# Patient Record
Sex: Female | Born: 1960 | Race: White | Hispanic: No | Smoking: Current some day smoker
Health system: Southern US, Community
[De-identification: ages and names within clinical notes are randomized; demographics above are authoritative.]

## PROBLEM LIST (undated history)

## (undated) DIAGNOSIS — I10 Essential (primary) hypertension: Secondary | ICD-10-CM

## (undated) DIAGNOSIS — K5792 Diverticulitis of intestine, part unspecified, without perforation or abscess without bleeding: Secondary | ICD-10-CM

## (undated) DIAGNOSIS — I456 Pre-excitation syndrome: Secondary | ICD-10-CM

## (undated) DIAGNOSIS — Z8679 Personal history of other diseases of the circulatory system: Secondary | ICD-10-CM

## (undated) DIAGNOSIS — Z9889 Other specified postprocedural states: Secondary | ICD-10-CM

## (undated) HISTORY — PX: COLONOSCOPY: SHX174

## (undated) HISTORY — PX: COLOSTOMY: SHX63

## (undated) HISTORY — PX: CHOLECYSTECTOMY: SHX55

## (undated) HISTORY — PX: COLOSTOMY REVISION: SHX5232

## (undated) HISTORY — PX: ABDOMINAL SURGERY: SHX537

---
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
## 1990-05-05 DIAGNOSIS — Z9889 Other specified postprocedural states: Secondary | ICD-10-CM | POA: Insufficient documentation

## 2002-06-20 DIAGNOSIS — Z729 Problem related to lifestyle, unspecified: Secondary | ICD-10-CM | POA: Insufficient documentation

## 2004-06-11 ENCOUNTER — Other Ambulatory Visit: Payer: Self-pay

## 2004-06-11 ENCOUNTER — Emergency Department: Payer: Self-pay | Admitting: Emergency Medicine

## 2004-11-12 ENCOUNTER — Emergency Department: Payer: Self-pay | Admitting: Unknown Physician Specialty

## 2004-12-03 ENCOUNTER — Emergency Department: Payer: Self-pay | Admitting: Emergency Medicine

## 2005-06-05 ENCOUNTER — Emergency Department: Payer: Self-pay | Admitting: General Practice

## 2005-06-05 ENCOUNTER — Other Ambulatory Visit: Payer: Self-pay

## 2005-08-07 ENCOUNTER — Emergency Department: Payer: Self-pay | Admitting: Emergency Medicine

## 2005-09-14 ENCOUNTER — Emergency Department: Payer: Self-pay | Admitting: Emergency Medicine

## 2005-09-15 ENCOUNTER — Emergency Department: Payer: Self-pay | Admitting: Emergency Medicine

## 2005-09-21 ENCOUNTER — Inpatient Hospital Stay: Payer: Self-pay | Admitting: Internal Medicine

## 2005-10-13 ENCOUNTER — Inpatient Hospital Stay: Payer: Self-pay | Admitting: Surgery

## 2006-07-26 ENCOUNTER — Emergency Department: Payer: Self-pay | Admitting: Emergency Medicine

## 2006-08-12 ENCOUNTER — Ambulatory Visit: Payer: Self-pay | Admitting: Gastroenterology

## 2007-10-03 ENCOUNTER — Emergency Department: Payer: Self-pay | Admitting: Emergency Medicine

## 2007-11-08 ENCOUNTER — Emergency Department: Payer: Self-pay | Admitting: Emergency Medicine

## 2007-12-02 ENCOUNTER — Other Ambulatory Visit: Payer: Self-pay

## 2007-12-02 ENCOUNTER — Emergency Department: Payer: Self-pay | Admitting: Emergency Medicine

## 2008-04-25 ENCOUNTER — Emergency Department: Payer: Self-pay | Admitting: Unknown Physician Specialty

## 2009-01-01 ENCOUNTER — Inpatient Hospital Stay (HOSPITAL_COMMUNITY): Admission: EM | Admit: 2009-01-01 | Discharge: 2009-01-06 | Payer: Self-pay | Admitting: Emergency Medicine

## 2009-03-02 ENCOUNTER — Inpatient Hospital Stay: Payer: Self-pay | Admitting: Surgery

## 2009-07-10 ENCOUNTER — Emergency Department: Payer: Self-pay | Admitting: Unknown Physician Specialty

## 2009-07-21 ENCOUNTER — Emergency Department: Payer: Self-pay | Admitting: Emergency Medicine

## 2009-08-29 ENCOUNTER — Emergency Department: Payer: Self-pay | Admitting: Emergency Medicine

## 2009-10-04 ENCOUNTER — Emergency Department: Payer: Self-pay | Admitting: Emergency Medicine

## 2009-10-13 ENCOUNTER — Emergency Department: Payer: Self-pay | Admitting: Internal Medicine

## 2009-10-25 ENCOUNTER — Ambulatory Visit: Payer: Self-pay | Admitting: Gastroenterology

## 2009-12-20 ENCOUNTER — Emergency Department: Payer: Self-pay | Admitting: Emergency Medicine

## 2010-06-20 ENCOUNTER — Emergency Department: Payer: Self-pay | Admitting: Emergency Medicine

## 2010-09-06 ENCOUNTER — Emergency Department: Payer: Self-pay | Admitting: Internal Medicine

## 2010-09-08 ENCOUNTER — Emergency Department: Payer: Self-pay | Admitting: Emergency Medicine

## 2010-09-22 ENCOUNTER — Emergency Department: Payer: Self-pay | Admitting: Emergency Medicine

## 2010-10-20 LAB — CBC
HCT: 33.8 % — ABNORMAL LOW (ref 36.0–46.0)
HCT: 39 % (ref 36.0–46.0)
Hemoglobin: 11.8 g/dL — ABNORMAL LOW (ref 12.0–15.0)
Hemoglobin: 13.4 g/dL (ref 12.0–15.0)
MCHC: 34.3 g/dL (ref 30.0–36.0)
MCHC: 35 g/dL (ref 30.0–36.0)
MCV: 93.4 fL (ref 78.0–100.0)
MCV: 93.8 fL (ref 78.0–100.0)
Platelets: 334 10*3/uL (ref 150–400)
Platelets: 390 10*3/uL (ref 150–400)
RBC: 3.62 MIL/uL — ABNORMAL LOW (ref 3.87–5.11)
RBC: 4.16 MIL/uL (ref 3.87–5.11)
RDW: 13.9 % (ref 11.5–15.5)
RDW: 14.6 % (ref 11.5–15.5)
WBC: 5.1 10*3/uL (ref 4.0–10.5)
WBC: 7.7 10*3/uL (ref 4.0–10.5)

## 2010-10-20 LAB — BASIC METABOLIC PANEL
BUN: 5 mg/dL — ABNORMAL LOW (ref 6–23)
CO2: 30 mEq/L (ref 19–32)
Calcium: 9 mg/dL (ref 8.4–10.5)
Chloride: 98 mEq/L (ref 96–112)
Creatinine, Ser: 0.53 mg/dL (ref 0.4–1.2)
GFR calc Af Amer: >60 mL/min (ref >60)
GFR calc non Af Amer: >60 mL/min (ref >60)
Glucose, Bld: 102 mg/dL — ABNORMAL HIGH (ref 70–99)
Potassium: 3.7 mEq/L (ref 3.5–5.1)
Sodium: 134 mEq/L — ABNORMAL LOW (ref 135–145)

## 2010-10-20 LAB — DIFFERENTIAL
Basophils Absolute: 0 10*3/uL (ref 0.0–0.1)
Basophils Absolute: 0 10*3/uL (ref 0.0–0.1)
Basophils Relative: 1 % (ref 0–1)
Basophils Relative: 1 % (ref 0–1)
Eosinophils Absolute: 0.3 10*3/uL (ref 0.0–0.7)
Eosinophils Absolute: 0.3 10*3/uL (ref 0.0–0.7)
Eosinophils Relative: 4 % (ref 0–5)
Eosinophils Relative: 6 % — ABNORMAL HIGH (ref 0–5)
Lymphocytes Relative: 27 % (ref 12–46)
Lymphocytes Relative: 37 % (ref 12–46)
Lymphs Abs: 1.9 10*3/uL (ref 0.7–4.0)
Lymphs Abs: 2.1 10*3/uL (ref 0.7–4.0)
Monocytes Absolute: 0.7 10*3/uL (ref 0.1–1.0)
Monocytes Absolute: 1 10*3/uL (ref 0.1–1.0)
Monocytes Relative: 13 % — ABNORMAL HIGH (ref 3–12)
Monocytes Relative: 13 % — ABNORMAL HIGH (ref 3–12)
Neutro Abs: 2.2 10*3/uL (ref 1.7–7.7)
Neutro Abs: 4.3 10*3/uL (ref 1.7–7.7)
Neutrophils Relative %: 44 % (ref 43–77)
Neutrophils Relative %: 56 % (ref 43–77)

## 2010-10-20 LAB — URINALYSIS, ROUTINE W REFLEX MICROSCOPIC
Bilirubin Urine: NEGATIVE
Glucose, UA: NEGATIVE mg/dL
Hgb urine dipstick: NEGATIVE
Ketones, ur: NEGATIVE mg/dL
Nitrite: NEGATIVE
Protein, ur: NEGATIVE mg/dL
Specific Gravity, Urine: 1.015 (ref 1.005–1.030)
Urobilinogen, UA: 2 mg/dL — ABNORMAL HIGH (ref 0.0–1.0)
pH: 6 (ref 5.0–8.0)

## 2010-10-20 LAB — URINE CULTURE: Colony Count: 30000

## 2010-10-20 LAB — URINE MICROSCOPIC-ADD ON

## 2010-10-22 ENCOUNTER — Emergency Department: Payer: Self-pay | Admitting: Emergency Medicine

## 2010-11-25 NOTE — Discharge Summary (Signed)
Alison Walker, Alison Walker                 ACCOUNT NO.:  [id number]   MEDICAL RECORD NO.:  [id number]          PATIENT TYPE:  INP   LOCATION:  A335                          FACILITY:  [facility]   PHYSICIAN:  Dalia Heading, M.D.  DATE OF BIRTH:  October 01, 1960   DATE OF ADMISSION:  01/01/2009  DATE OF DISCHARGE:  06/27/2010LH                               DISCHARGE SUMMARY   HOSPITAL COURSE SUMMARY:  The patient is a 50 year old white female  status post multiple abdominal surgeries in the past including  incisional herniorrhaphy with mesh over 8 years ago at [facility] who  presented with cellulitis of the abdominal wall.  CT scan of the abdomen  and pelvis did not reveal a fistula between the bowel and the mesh.  She  was started on antibiotics.  She subsequently was taken to the operating  room on January 04, 2009, underwent debridement of the abdominal wall.  There was edematous tissue over the mesh, but there was no abscess  present.  The wound was irrigated with antibiotic and normal saline.  She tolerated the procedure well.  Her postoperative course was  unremarkable.  Diet was advanced without difficulty.   The patient is being discharged home on postoperative day #2 in good and  improving condition.   DISCHARGE INSTRUCTIONS:  The patient is to follow up with Dr. Franky Macho on 01/15/2009.   DISCHARGE MEDICATIONS:  Include Vicodin 1-2 tablets p.o. q.4 h p.r.n.  pain, Bactrim DS 1 tablet p.o. b.i.d. x1 week.   PRINCIPAL DIAGNOSIS:  Abdominal wall cellulitis.   PRINCIPAL PROCEDURE:  Debridement of abdominal wall on January 04, 2009.      Dalia Heading, M.D.  Electronically Signed     MAJ/MEDQ  D:  01/06/2009  T:  01/06/2009  Job:  [id number]

## 2010-11-25 NOTE — H&P (Signed)
Alison Walker, Alison Walker                 ACCOUNT NO.:  [id number]   MEDICAL RECORD NO.:  [id number]          PATIENT TYPE:  INP   LOCATION:  A335                          FACILITY:  [facility]   PHYSICIAN:  Dalia Heading, M.D.  DATE OF BIRTH:  [birth year]   DATE OF ADMISSION:  01/01/2009  DATE OF DISCHARGE:  LH                              HISTORY & PHYSICAL   CHIEF COMPLAINT:  Abdominal pain.   HISTORY OF PRESENT ILLNESS:  Patient is a 50 year old white female who  was in her usual state of health until 3 days ago when she began  experiencing fevers and mid abdominal pain.  She did have some episodes  of emesis.  She presented to the emergency room for further evaluation  and treatment.  CT scan of the abdomen and pelvis revealed what appeared  to be a cellulitis and possible early formation of abscess around mesh  that had been placed in the abdominal wall.  She is status post multiple  surgical procedures in the past though the incisional herniorrhaphy with  mesh that was done at [facility], [facility], was performed  approximately 8 or 9 years ago.   PAST MEDICAL HISTORY:  Unremarkable.   PAST SURGICAL HISTORY:  1. Open cholecystectomy.  2. Colectomy with colostomy for diverticulitis.  3. Incisional herniorrhaphy with mesh.   CURRENT MEDICATIONS:  BC Powder.   ALLERGIES:  NO KNOWN DRUG ALLERGIES.   REVIEW OF SYSTEMS:  Patient does not drink or smoke.   PHYSICAL EXAMINATION:  Patient is a well-developed, well-nourished,  white female in no acute distress.  She is afebrile and vital signs are  stable.  LUNGS:  Clear to auscultation with equal breath sounds bilaterally.  HEART:  Reveals a regular rate and rhythm without S3, S4, murmurs.  ABDOMEN:  Soft with tenderness noted in the supraumbilical region.  Some  firmness is noted in this region.  No hernias were appreciated.  Multiple surgical scars are present.  No hepatosplenomegaly is noted.   White blood cell count 7.7,  hemoglobin 13.4, hematocrit 39, platelet  count 390.  Met-7 is within normal limits.   IMPRESSION:  Abdominal wall cellulitis, possible infection of mesh.   PLAN:  The patient is being admitted for intravenous antibiotic therapy.  If this does not resolve the inflammation and infection, she will  subsequently need removal of the mesh.  This has been explained to the  patient who agrees to the treatment plan.      Dalia Heading, M.D.  Electronically Signed     MAJ/MEDQ  D:  01/02/2009  T:  01/02/2009  Job:  [id number]

## 2010-11-25 NOTE — Op Note (Signed)
Alison Walker, Alison Walker                 ACCOUNT NO.:  [id number]   MEDICAL RECORD NO.:  [id number]         PATIENT TYPE:  PINP   LOCATION:  A335                          FACILITY:  [facility]   PHYSICIAN:  Dalia Heading, M.D.  DATE OF BIRTH:  [birth year]   DATE OF PROCEDURE:  01/04/2009  DATE OF DISCHARGE:                               OPERATIVE REPORT   PREOPERATIVE DIAGNOSIS:  Cellulitis of abdominal wall.   POSTOPERATIVE DIAGNOSIS:  Cellulitis of abdominal wall.   PROCEDURE:  Debridement of abdominal wall.   SURGEON:  Dalia Heading, MD   ANESTHESIA:  General endotracheal.   INDICATIONS:  The patient is a 50 year old white female status post  incisional herniorrhaphy with mesh over 8 years ago at [place],  [place], who now presents with fluid and possible gas bubbles  anterior to this mesh.  A CT scan of the abdomen and pelvis with oral  contrast did not reveal any type of fistulous tract from the small  bowel.  The patient now comes to the operating room for wound  exploration.  Risks and benefits of the procedure including bleeding,  infection, the possibility of needing to remove the mesh, the possibly  of a small bowel resection, and the possibly of replacing the mesh were  fully explained to the patient, gave informed consent.   PROCEDURE NOTE:  The patient was placed in the supine position.  After  induction of general endotracheal anesthesia, the abdomen was prepped  and draped using the usual sterile technique with ChloraPrep.  Surgical  site confirmation was performed.   A midline incision was made through the previous midline surgical scar.  This was taken from above the umbilicus to below the umbilicus.  The  dissection was taken down to the base of the mesh.  There was some  edematous fluid along the inferior aspect of the wound.  No purulent  fluid was noted.  Some granulomatous tissue was noted around the base of  the umbilicus which were secured to the  previously placed mesh.  Good  ingrowth was noted of native tissue around the mesh.  As no abscess  cavity was found, it was decided to leave the previously placed mesh in  place.  Any granulomatous tissue was debrided without difficulty.  The  wound was irrigated with Ancef and normal saline.  The subcutaneous  layer was reapproximated using 2-0 Vicryl interrupted suture.  The skin  was closed using staples.  Betadine ointment and dry sterile dressings  were applied.   All tape and needle counts were correct at the end of the procedure.  The patient was extubated in the operating room and went back to  recovery room, awake in stable condition.   COMPLICATIONS:  None.   SPECIMEN:  None.   BLOOD LOSS:  None.      Dalia Heading, M.D.  Electronically Signed     MAJ/MEDQ  D:  01/04/2009  T:  01/05/2009  Job:  [id number]

## 2010-12-14 ENCOUNTER — Emergency Department: Payer: Self-pay | Admitting: Unknown Physician Specialty

## 2010-12-18 DIAGNOSIS — K56609 Unspecified intestinal obstruction, unspecified as to partial versus complete obstruction: Secondary | ICD-10-CM | POA: Insufficient documentation

## 2011-03-11 ENCOUNTER — Encounter: Payer: Self-pay | Admitting: Emergency Medicine

## 2011-03-11 ENCOUNTER — Emergency Department (HOSPITAL_COMMUNITY)
Admission: EM | Admit: 2011-03-11 | Discharge: 2011-03-12 | Disposition: A | Discharge status: Home / Self Care | Payer: Self-pay | Attending: Emergency Medicine | Admitting: Emergency Medicine

## 2011-03-11 ENCOUNTER — Emergency Department (HOSPITAL_COMMUNITY): Payer: Self-pay

## 2011-03-11 ENCOUNTER — Other Ambulatory Visit: Payer: Self-pay

## 2011-03-11 DIAGNOSIS — R1031 Right lower quadrant pain: Secondary | ICD-10-CM | POA: Insufficient documentation

## 2011-03-11 DIAGNOSIS — R109 Unspecified abdominal pain: Secondary | ICD-10-CM | POA: Insufficient documentation

## 2011-03-11 DIAGNOSIS — R404 Transient alteration of awareness: Secondary | ICD-10-CM | POA: Insufficient documentation

## 2011-03-11 DIAGNOSIS — F29 Unspecified psychosis not due to a substance or known physiological condition: Secondary | ICD-10-CM | POA: Insufficient documentation

## 2011-03-11 DIAGNOSIS — R059 Cough, unspecified: Secondary | ICD-10-CM | POA: Insufficient documentation

## 2011-03-11 DIAGNOSIS — Z87891 Personal history of nicotine dependence: Secondary | ICD-10-CM | POA: Insufficient documentation

## 2011-03-11 DIAGNOSIS — R410 Disorientation, unspecified: Secondary | ICD-10-CM

## 2011-03-11 DIAGNOSIS — R05 Cough: Secondary | ICD-10-CM | POA: Insufficient documentation

## 2011-03-11 DIAGNOSIS — R0602 Shortness of breath: Secondary | ICD-10-CM | POA: Insufficient documentation

## 2011-03-11 DIAGNOSIS — I10 Essential (primary) hypertension: Secondary | ICD-10-CM | POA: Insufficient documentation

## 2011-03-11 DIAGNOSIS — R1012 Left upper quadrant pain: Secondary | ICD-10-CM

## 2011-03-11 HISTORY — DX: Essential (primary) hypertension: I10

## 2011-03-11 HISTORY — DX: Diverticulitis of intestine, part unspecified, without perforation or abscess without bleeding: K57.92

## 2011-03-11 LAB — COMPREHENSIVE METABOLIC PANEL
ALT: 181 U/L — ABNORMAL HIGH (ref 0–35)
AST: 257 U/L — ABNORMAL HIGH (ref 0–37)
Albumin: 3.4 g/dL — ABNORMAL LOW (ref 3.5–5.2)
Alkaline Phosphatase: 73 U/L (ref 39–117)
BUN: 24 mg/dL — ABNORMAL HIGH (ref 6–23)
CO2: 29 mEq/L (ref 19–32)
Calcium: 8.6 mg/dL (ref 8.4–10.5)
Chloride: 101 mEq/L (ref 96–112)
Creatinine, Ser: 0.6 mg/dL (ref 0.50–1.10)
GFR calc Af Amer: >60 mL/min (ref >60)
GFR calc non Af Amer: >60 mL/min (ref >60)
Glucose, Bld: 142 mg/dL — ABNORMAL HIGH (ref 70–99)
Potassium: 4 mEq/L (ref 3.5–5.1)
Sodium: 138 mEq/L (ref 135–145)
Total Bilirubin: 0.3 mg/dL (ref 0.3–1.2)
Total Protein: 6.7 g/dL (ref 6.0–8.3)

## 2011-03-11 LAB — URINALYSIS, ROUTINE W REFLEX MICROSCOPIC
Bilirubin Urine: NEGATIVE
Glucose, UA: NEGATIVE mg/dL
Hgb urine dipstick: NEGATIVE
Ketones, ur: NEGATIVE mg/dL
Leukocytes, UA: NEGATIVE
Nitrite: NEGATIVE
Protein, ur: NEGATIVE mg/dL
Specific Gravity, Urine: 1.025 (ref 1.005–1.030)
Urobilinogen, UA: 1 mg/dL (ref 0.0–1.0)
pH: 6 (ref 5.0–8.0)

## 2011-03-11 LAB — CBC
HCT: 34.4 % — ABNORMAL LOW (ref 36.0–46.0)
Hemoglobin: 11.3 g/dL — ABNORMAL LOW (ref 12.0–15.0)
MCH: 32.3 pg (ref 26.0–34.0)
MCHC: 32.8 g/dL (ref 30.0–36.0)
MCV: 98.3 fL (ref 78.0–100.0)
Platelets: 393 10*3/uL (ref 150–400)
RBC: 3.5 MIL/uL — ABNORMAL LOW (ref 3.87–5.11)
RDW: 14.9 % (ref 11.5–15.5)
WBC: 11.5 10*3/uL — ABNORMAL HIGH (ref 4.0–10.5)

## 2011-03-11 LAB — RAPID URINE DRUG SCREEN, HOSP PERFORMED
Amphetamines: NOT DETECTED
Barbiturates: NOT DETECTED
Benzodiazepines: POSITIVE — AB
Cocaine: NOT DETECTED
Opiates: NOT DETECTED
Tetrahydrocannabinol: POSITIVE — AB

## 2011-03-11 LAB — LIPASE, BLOOD: Lipase: 11 U/L (ref 11–59)

## 2011-03-11 MED ORDER — SODIUM CHLORIDE 0.9 % IV BOLUS (SEPSIS)
250.0000 mL | Freq: Once | INTRAVENOUS | Status: AC
  Administered 2011-03-11: 250 mL via INTRAVENOUS

## 2011-03-11 MED ORDER — IOHEXOL 300 MG/ML  SOLN
100.0000 mL | Freq: Once | INTRAMUSCULAR | Status: AC | PRN
  Administered 2011-03-11: 100 mL via INTRAVENOUS

## 2011-03-11 MED ORDER — ONDANSETRON HCL 4 MG/2ML IJ SOLN
INTRAMUSCULAR | Status: AC
  Administered 2011-03-11: 4 mg via INTRAVENOUS
  Filled 2011-03-11: qty 2

## 2011-03-11 MED ORDER — SODIUM CHLORIDE 0.9 % IV SOLN
INTRAVENOUS | Status: DC
  Administered 2011-03-11: 23:00:00 via INTRAVENOUS

## 2011-03-11 MED ORDER — NALOXONE HCL 0.4 MG/ML IJ SOLN
0.4000 mg | Freq: Once | INTRAMUSCULAR | Status: AC
  Administered 2011-03-11: 0.4 mg via INTRAVENOUS
  Filled 2011-03-11: qty 1

## 2011-03-11 MED ORDER — ONDANSETRON HCL 4 MG/2ML IJ SOLN
4.0000 mg | Freq: Once | INTRAMUSCULAR | Status: AC
  Administered 2011-03-11: 4 mg via INTRAVENOUS

## 2011-03-11 MED ORDER — ONDANSETRON HCL 4 MG/2ML IJ SOLN
4.0000 mg | Freq: Once | INTRAMUSCULAR | Status: AC
  Administered 2011-03-11: 4 mg via INTRAVENOUS
  Filled 2011-03-11: qty 2

## 2011-03-11 NOTE — ED Notes (Signed)
Pt arrived by EMS. Per report pt has change of mental status today upon awakening. Pupils equal, R pupil sluggish. No slurring of speech, smile equal. No extremity drift. Grips equal. Pt lethargic. Arouses to voice. Denies taking medication or substances she doesn't normally take. Friend reports she has been doing irrational things (i.e. Putting dog food in the washing machine detergent dispenser). Pt oriented x 3.

## 2011-03-11 NOTE — ED Notes (Signed)
Pt very lethargic,  Awakens to voice. Oriented x 3. C/O severe headache, abd pain. Friend reports pt's mental status has changed today. Apneic when not aroused, sats dropping into 80s.

## 2011-03-11 NOTE — ED Notes (Signed)
Son in room & states pt has had several problems w/ her abdomen over the years. Pt states has had some puss from either vaginal or rectum area, she is unable to tell. EDP notified of this information.

## 2011-03-11 NOTE — ED Notes (Signed)
Ct called advises pt is vomiting black emesis at this time. EDP notified.

## 2011-03-11 NOTE — ED Provider Notes (Addendum)
History     CSN: [id number] Arrival date & time: 03/11/2011  4:02 PM Scribed for Shelda Jakes, MD, the patient was seen in room APA05/APA05. This chart was scribed by Katha Cabal. This patient's care was started at 4:21PM.    Chief Complaint  Patient presents with  . Altered Mental Status   Patient is a 50 y.o. female presenting with altered mental status.  Altered Mental Status Associated symptoms include abdominal pain and shortness of breath.   Alison Walker is a 50 y.o. female brought in by ambulance, who presents to the Emergency Department complaining of confused onset 12PM with associated RLQ and supapubic abdominal pain with associated  "sticky" vomit, urine incontinence, vaginal discharge, SOB, chest pain.   Pt stated that she has a abdominal hernia that she was Dx with a month ago and has [facility] appointment with [facility] to get abdominal [facility]nia fixed, and an ppointment with new PCP on Sept. 19. Pt has run out of Percocet that she was Rx for pain.  Pt was Hospitalized in [facility] for hernia blockage.  History is limited by a Level 5 Caveat for AMS.     PAST MEDICAL HISTORY:  Past Medical History  Diagnosis Date  . Hypertension   . Diverticulitis     PAST SURGICAL HISTORY:  Past Surgical History  Procedure Date  . Abdominal surgery   . Colonoscopy   . Colostomy   . Colostomy revision   . Cholecystectomy   . Tonsillectomy     MEDICATIONS:  Previous Medications   No medications on file     ALLERGIES:  Allergies as of 03/11/2011  . (No Known Allergies)     FAMILY HISTORY:  Family History  Problem Relation Age of Onset  . Cancer Mother   . Cancer Other      SOCIAL HISTORY: History   Social History  . Marital Status: Widowed    Spouse Name: N/A    Number of Children: N/A  . Years of Education: N/A   Social History Main Topics  . Smoking status: Former Smoker    Quit date: 03/10/2009  . Smokeless tobacco: Never Used  . Alcohol Use: No  .  Drug Use: No  . Sexually Active: No   Other Topics Concern  . None   Social History Narrative  . None    Review of Systems  Unable to perform ROS: Mental status change  Respiratory: Positive for cough (productive (green)) and shortness of breath.   Gastrointestinal: Positive for vomiting and abdominal pain.  Genitourinary: Positive for vaginal discharge ("pus like").  Psychiatric/Behavioral: Positive for confusion and altered mental status.    Physical Exam  BP 129/73  Pulse 83  Temp(Src) 99 F (37.2 C) (Oral)  Resp 20  Ht 5\' 2"  (1.575 m)  Wt 168 lb (76.204 kg)  BMI 30.73 kg/m2  SpO2 97%  Physical Exam  Nursing note and vitals reviewed. Constitutional: She appears well-developed and well-nourished.  HENT:  Head: Normocephalic and atraumatic.  Mouth/Throat: Mucous membranes are dry.  Eyes: EOM are normal. Pupils are equal, round, and reactive to light.  Neck: Normal range of motion. Neck supple.  Cardiovascular: Normal rate, regular rhythm and normal heart sounds.   No murmur heard. Pulmonary/Chest: Effort normal and breath sounds normal. She has no wheezes.  Abdominal: Soft. Bowel sounds are normal. There is no tenderness.  Musculoskeletal: Normal range of motion. She exhibits no edema.  Neurological: She is alert. No cranial nerve deficit or sensory  deficit.  Skin: Skin is warm and dry.       Abdominal inciosion scar: RUQ, midline scar, and incision LLQ all have healed well    Psychiatric:       Confused    ED Course  Procedures   OTHER DATA REVIEWED: Nursing notes, vital signs, and past medical records reviewed.   Date: 03/11/2011  Rate: 82   Rhythm: normal sinus rhythm  QRS Axis: normal  Intervals: normal  ST/T Wave abnormalities: nonspecific ST/T changes  Conduction Disutrbances:none  Narrative Interpretation:   Old EKG Reviewed: none available  Cardiac Monitor  Heart rate 78, BP 128/68 No ectopy present.   Interpreted by EDMD.      DIAGNOSTIC STUDIES: Oxygen Saturation is 94% on room air, normal by my interpretation.       LABS / RADIOLOGY:  Results for orders placed during the hospital encounter of 03/11/11  CBC      Component Value Range   WBC 11.5 (*) 4.0 - 10.5 (K/uL)   RBC 3.50 (*) 3.87 - 5.11 (MIL/uL)   Hemoglobin 11.3 (*) 12.0 - 15.0 (g/dL)   HCT 16.1 (*) 09.6 - 46.0 (%)   MCV 98.3  78.0 - 100.0 (fL)   MCH 32.3  26.0 - 34.0 (pg)   MCHC 32.8  30.0 - 36.0 (g/dL)   RDW 04.5  40.9 - 81.1 (%)   Platelets 393  150 - 400 (K/uL)  COMPREHENSIVE METABOLIC PANEL      Component Value Range   Sodium 138  135 - 145 (mEq/L)   Potassium 4.0  3.5 - 5.1 (mEq/L)   Chloride 101  96 - 112 (mEq/L)   CO2 29  19 - 32 (mEq/L)   Glucose, Bld 142 (*) 70 - 99 (mg/dL)   BUN 24 (*) 6 - 23 (mg/dL)   Creatinine, Ser 9.14  0.50 - 1.10 (mg/dL)   Calcium 8.6  8.4 - 78.2 (mg/dL)   Total Protein 6.7  6.0 - 8.3 (g/dL)   Albumin 3.4 (*) 3.5 - 5.2 (g/dL)   AST 956 (*) 0 - 37 (U/L)   ALT 181 (*) 0 - 35 (U/L)   Alkaline Phosphatase 73  39 - 117 (U/L)   Total Bilirubin 0.3  0.3 - 1.2 (mg/dL)   GFR calc non Af Amer >60  >60 (mL/min)   GFR calc Af Amer >60  >60 (mL/min)  LIPASE, BLOOD      Component Value Range   Lipase 11  11 - 59 (U/L)  URINALYSIS, ROUTINE W REFLEX MICROSCOPIC      Component Value Range   Color, Urine AMBER (*) YELLOW    Appearance CLEAR  CLEAR    Specific Gravity, Urine 1.025  1.005 - 1.030    pH 6.0  5.0 - 8.0    Glucose, UA NEGATIVE  NEGATIVE (mg/dL)   Hgb urine dipstick NEGATIVE  NEGATIVE    Bilirubin Urine NEGATIVE  NEGATIVE    Ketones, ur NEGATIVE  NEGATIVE (mg/dL)   Protein, ur NEGATIVE  NEGATIVE (mg/dL)   Urobilinogen, UA 1.0  0.0 - 1.0 (mg/dL)   Nitrite NEGATIVE  NEGATIVE    Leukocytes, UA NEGATIVE  NEGATIVE   URINE RAPID DRUG SCREEN (HOSP PERFORMED)      Component Value Range   Opiates NONE DETECTED  NONE DETECTED    Cocaine NONE DETECTED  NONE DETECTED    Benzodiazepines POSITIVE (*) NONE  DETECTED    Amphetamines NONE DETECTED  NONE DETECTED    Tetrahydrocannabinol POSITIVE (*)  NONE DETECTED    Barbiturates NONE DETECTED  NONE DETECTED      Ct Head Wo Contrast  03/11/2011  *RADIOLOGY REPORT*  Clinical Data: 50 year old female with lethargy, altered mental status.  CT HEAD WITHOUT CONTRAST  Technique:  Contiguous axial images were obtained from the base of the skull through the vertex without contrast.  Comparison: None.  Findings: Visualized orbits and scalp soft tissues are within normal limits.  Visualized paranasal sinuses and mastoids are clear.  No acute osseous abnormality identified.  Cerebral volume is within normal limits for age.  No midline shift, ventriculomegaly, mass effect, evidence of mass lesion, intracranial hemorrhage or evidence of cortically based acute infarction.  Gray-white matter differentiation is within normal limits throughout the brain.  No suspicious intracranial vascular hyperdensity.  IMPRESSION: Normal noncontrast CT appearance of the brain.  Original Report Authenticated By: Harley Hallmark, M.D.   Ct Abdomen Pelvis W Contrast  03/11/2011  *RADIOLOGY REPORT*  Clinical Data: Abdominal pain  CT ABDOMEN AND PELVIS WITH CONTRAST  Technique:  Multidetector CT imaging of the abdomen and pelvis was performed following the standard protocol during bolus administration of intravenous contrast.  Contrast: 100 ml Omnipaque-300  Comparison: 01/02/2009  Findings: Mild ground-glass opacity in the right lung base is likely atelectasis.  Heart size within normal limits.  No pleural or pericardial effusion.  Small hiatal hernia.  No focal liver lesions.  Status post cholecystectomy.  Mild prominence of the common bile duct, tapers to the level of the ampulla.  No obstructing lesion identified.  Unremarkable spleen, pancreas, adrenal glands.  Focal scarring along the right upper pole posteriorly.  Otherwise, symmetric renal enhancement.  No hydronephrosis or hydroureter.  No  urinary tract calculi.  Bowel nonstenotic suture at the rectosigmoid colon.  No bowel obstruction.  There is a small bowel anastomotic suture in the midline lower pelvis.  This is near the level of the anterior abdominal wall hernia, containing loops of bowel.  There is mild stranding of the subcutaneous fat overlying this hernia.  No loculated fluid collection to suggest abscess.  No free intraperitoneal air.  Normal appendix.  No lymphadenopathy.  There is scattered atherosclerotic calcification of the aorta and its branches. No aneurysmal dilatation.  Bladder decompressed with Foley catheter in place.  Small amount of air within the bladder is presumably secondary to instrumentation. Absent uterus.  No adnexal mass.  Multilevel degenerative changes of the imaged spine. No acute or aggressive appearing osseous lesion.  IMPRESSION: Lower anterior abdominal wall hernia containing loops of small bowel.  No CT evidence for obstruction or incarceration.  There is stranding of the overlying subcutaneous fat which suggest cellulitis.  No loculated fluid collection to suggest abscess.  Original Report Authenticated By: Waneta Martins, M.D.   Dg Chest Portable 1 View  03/11/2011  *RADIOLOGY REPORT*  Clinical Data: Productive cough, congestion, altered mental status, hypertension  PORTABLE CHEST - 1 VIEW  Comparison: Portable exam 1659 hours compared to 01/01/2009  Findings: Enlargement of cardiac silhouette. Pulmonary vascular congestion. Subtle perihilar infiltrates question mild pulmonary edema/CHF, less likely infection. No segmental consolidation, pleural effusion or pneumothorax. Numerous cardiac monitoring lines project over chest.  IMPRESSION: Enlargement of cardiac silhouette with pulmonary vascular congestion. Mild perihilar infiltrates, question pulmonary edema or less likely infection.  Original Report Authenticated By: Lollie Marrow, M.D.      ED COURSE / COORDINATION OF CARE:  Orders Placed This  Encounter  Procedures  . Urine culture  . DG Chest  Portable 1 View  . CT Head Wo Contrast  . CT Abdomen Pelvis W Contrast  . CBC  . Comprehensive metabolic panel  . Lipase, blood  . Urinalysis with microscopic  . Drug screen panel, emergency  . Cardiac monitoring  . Insert foley catheter  . Pulse oximetry, continuous    MDM:  NOW AT 0100 MORE ALERT AND FEELING BETTER. DESIRES TO GO HOME. CT ABD DEPICTS KNOWN HERNIA NO OBSTRUCTION OR STRANGULATION. AMBULATING IN ED. OKAY TO GO HOME.  WILL RX FOR NAUSEA AND THE CHRONIC ABD PAIN.    IMPRESSION: Diagnoses that have been ruled out:  Diagnoses that are still under consideration:  Final diagnoses:    MEDICATIONS GIVEN IN THE E.D. Scheduled Meds:    . naloxone  0.4 mg Intravenous Once  . ondansetron  4 mg Intravenous Once  . ondansetron  4 mg Intravenous Once  . sodium chloride  250 mL Intravenous Once   Continuous Infusions:    . sodium chloride 100 mL/hr at 03/11/11 2243      DISCHARGE MEDICATIONS: New Prescriptions   No medications on file     I personally performed the services described in this documentation, which was scribed in my presence. The recorded information has been reviewed and considered. Shelda Jakes, MD       Shelda Jakes, MD 03/12/11 1610  Shelda Jakes, MD 03/12/11 [phone]

## 2011-03-11 NOTE — ED Notes (Signed)
Ed tech advises pt requesting some pain medication for her abdomen. EDP notified.

## 2011-03-11 NOTE — ED Notes (Signed)
Pt will awake to verbal stimuli, answer questions correctly & falls back to sleep. Pt w/ snoring respirations.Pt remains on cardiac monitor w/ NIBP vital signs WNL. NAD noted. No needs voiced at this time.

## 2011-03-11 NOTE — ED Notes (Signed)
Pt reported meds to be Atenolol and HCTZ. Unable to verify thru local pharmacies. Pharmacy tech unable to verify meds.

## 2011-03-12 ENCOUNTER — Emergency Department (HOSPITAL_COMMUNITY): Payer: Self-pay

## 2011-03-12 ENCOUNTER — Inpatient Hospital Stay (HOSPITAL_COMMUNITY)
Admission: AD | Admit: 2011-03-12 | Discharge: 2011-03-16 | DRG: 392 | Disposition: A | Discharge status: Home / Self Care | Payer: Self-pay | Source: Other Acute Inpatient Hospital | Attending: Interventional Cardiology | Admitting: Interventional Cardiology

## 2011-03-12 ENCOUNTER — Emergency Department (HOSPITAL_COMMUNITY)
Admission: EM | Admit: 2011-03-12 | Discharge: 2011-03-12 | Disposition: A | Discharge status: Other Acute Inpatient Hospital | Payer: Self-pay | Attending: Emergency Medicine | Admitting: Emergency Medicine

## 2011-03-12 ENCOUNTER — Encounter (HOSPITAL_COMMUNITY): Payer: Self-pay | Admitting: Emergency Medicine

## 2011-03-12 DIAGNOSIS — R778 Other specified abnormalities of plasma proteins: Secondary | ICD-10-CM

## 2011-03-12 DIAGNOSIS — R059 Cough, unspecified: Secondary | ICD-10-CM | POA: Insufficient documentation

## 2011-03-12 DIAGNOSIS — R05 Cough: Secondary | ICD-10-CM | POA: Insufficient documentation

## 2011-03-12 DIAGNOSIS — I10 Essential (primary) hypertension: Secondary | ICD-10-CM | POA: Insufficient documentation

## 2011-03-12 DIAGNOSIS — K259 Gastric ulcer, unspecified as acute or chronic, without hemorrhage or perforation: Secondary | ICD-10-CM | POA: Diagnosis present

## 2011-03-12 DIAGNOSIS — R7989 Other specified abnormal findings of blood chemistry: Secondary | ICD-10-CM | POA: Insufficient documentation

## 2011-03-12 DIAGNOSIS — R799 Abnormal finding of blood chemistry, unspecified: Secondary | ICD-10-CM | POA: Diagnosis present

## 2011-03-12 DIAGNOSIS — R109 Unspecified abdominal pain: Secondary | ICD-10-CM | POA: Insufficient documentation

## 2011-03-12 DIAGNOSIS — R112 Nausea with vomiting, unspecified: Principal | ICD-10-CM | POA: Diagnosis present

## 2011-03-12 DIAGNOSIS — M6282 Rhabdomyolysis: Secondary | ICD-10-CM | POA: Diagnosis present

## 2011-03-12 DIAGNOSIS — K2100 Gastro-esophageal reflux disease with esophagitis, without bleeding: Secondary | ICD-10-CM | POA: Diagnosis present

## 2011-03-12 DIAGNOSIS — E785 Hyperlipidemia, unspecified: Secondary | ICD-10-CM | POA: Diagnosis present

## 2011-03-12 DIAGNOSIS — E876 Hypokalemia: Secondary | ICD-10-CM | POA: Diagnosis present

## 2011-03-12 DIAGNOSIS — D649 Anemia, unspecified: Secondary | ICD-10-CM | POA: Diagnosis present

## 2011-03-12 DIAGNOSIS — K449 Diaphragmatic hernia without obstruction or gangrene: Secondary | ICD-10-CM | POA: Diagnosis present

## 2011-03-12 DIAGNOSIS — R079 Chest pain, unspecified: Secondary | ICD-10-CM

## 2011-03-12 DIAGNOSIS — K5730 Diverticulosis of large intestine without perforation or abscess without bleeding: Secondary | ICD-10-CM | POA: Diagnosis present

## 2011-03-12 DIAGNOSIS — K263 Acute duodenal ulcer without hemorrhage or perforation: Secondary | ICD-10-CM | POA: Diagnosis present

## 2011-03-12 HISTORY — DX: Personal history of other diseases of the circulatory system: Z98.890

## 2011-03-12 HISTORY — DX: Personal history of other diseases of the circulatory system: Z86.79

## 2011-03-12 HISTORY — DX: Pre-excitation syndrome: I45.6

## 2011-03-12 LAB — LIPASE, BLOOD: Lipase: 19 U/L (ref 11–59)

## 2011-03-12 LAB — DIFFERENTIAL
Basophils Absolute: 0 10*3/uL (ref 0.0–0.1)
Basophils Relative: 0 % (ref 0–1)
Eosinophils Absolute: 0 10*3/uL (ref 0.0–0.7)
Eosinophils Relative: 0 % (ref 0–5)
Lymphocytes Relative: 15 % (ref 12–46)
Lymphs Abs: 1.5 10*3/uL (ref 0.7–4.0)
Monocytes Absolute: 0.7 10*3/uL (ref 0.1–1.0)
Monocytes Relative: 7 % (ref 3–12)
Neutro Abs: 7.8 10*3/uL — ABNORMAL HIGH (ref 1.7–7.7)
Neutrophils Relative %: 78 % — ABNORMAL HIGH (ref 43–77)

## 2011-03-12 LAB — CARDIAC PANEL(CRET KIN+CKTOT+MB+TROPI)
CK, MB: 34.5 ng/mL (ref 0.3–4.0)
Relative Index: 1.1 (ref 0.0–2.5)
Total CK: 3004 U/L — ABNORMAL HIGH (ref 7–177)
Troponin I: 2.69 ng/mL (ref <0.30)

## 2011-03-12 LAB — URINE CULTURE
Colony Count: NO GROWTH
Culture  Setup Time: 201208291910
Culture: NO GROWTH

## 2011-03-12 LAB — CBC
HCT: 35.9 % — ABNORMAL LOW (ref 36.0–46.0)
Hemoglobin: 11.8 g/dL — ABNORMAL LOW (ref 12.0–15.0)
MCH: 32.3 pg (ref 26.0–34.0)
MCHC: 32.9 g/dL (ref 30.0–36.0)
MCV: 98.4 fL (ref 78.0–100.0)
Platelets: 362 10*3/uL (ref 150–400)
RBC: 3.65 MIL/uL — ABNORMAL LOW (ref 3.87–5.11)
RDW: 14.8 % (ref 11.5–15.5)
WBC: 10 10*3/uL (ref 4.0–10.5)

## 2011-03-12 LAB — PRO B NATRIURETIC PEPTIDE: Pro B Natriuretic peptide (BNP): 1276 pg/mL — ABNORMAL HIGH (ref 0–125)

## 2011-03-12 LAB — HEPATIC FUNCTION PANEL
ALT: 145 U/L — ABNORMAL HIGH (ref 0–35)
AST: 143 U/L — ABNORMAL HIGH (ref 0–37)
Albumin: 3.6 g/dL (ref 3.5–5.2)
Alkaline Phosphatase: 69 U/L (ref 39–117)
Bilirubin, Direct: 0.2 mg/dL (ref 0.0–0.3)
Indirect Bilirubin: 0.3 mg/dL (ref 0.3–0.9)
Total Bilirubin: 0.5 mg/dL (ref 0.3–1.2)
Total Protein: 6.4 g/dL (ref 6.0–8.3)

## 2011-03-12 LAB — URINALYSIS, ROUTINE W REFLEX MICROSCOPIC
Glucose, UA: NEGATIVE mg/dL
Ketones, ur: 40 mg/dL — AB
Leukocytes, UA: NEGATIVE
Nitrite: NEGATIVE
Protein, ur: NEGATIVE mg/dL
Specific Gravity, Urine: >1.03 — ABNORMAL HIGH (ref 1.005–1.030)
Urobilinogen, UA: 1 mg/dL (ref 0.0–1.0)
pH: 6 (ref 5.0–8.0)

## 2011-03-12 LAB — BASIC METABOLIC PANEL
BUN: 15 mg/dL (ref 6–23)
CO2: 25 mEq/L (ref 19–32)
Calcium: 8.6 mg/dL (ref 8.4–10.5)
Chloride: 101 mEq/L (ref 96–112)
Creatinine, Ser: <0.47 mg/dL — ABNORMAL LOW (ref 0.50–1.10)
Glucose, Bld: 113 mg/dL — ABNORMAL HIGH (ref 70–99)
Potassium: 3.8 mEq/L (ref 3.5–5.1)
Sodium: 137 mEq/L (ref 135–145)

## 2011-03-12 LAB — URINE MICROSCOPIC-ADD ON

## 2011-03-12 MED ORDER — HYDROMORPHONE HCL 1 MG/ML IJ SOLN
0.5000 mg | Freq: Once | INTRAMUSCULAR | Status: AC
  Administered 2011-03-12: 0.5 mg via INTRAVENOUS
  Filled 2011-03-12: qty 1

## 2011-03-12 MED ORDER — ACETAMINOPHEN 500 MG PO TABS
1000.0000 mg | ORAL_TABLET | Freq: Once | ORAL | Status: AC
  Administered 2011-03-12: 1000 mg via ORAL
  Filled 2011-03-12: qty 2

## 2011-03-12 MED ORDER — PROMETHAZINE HCL 25 MG/ML IJ SOLN
INTRAMUSCULAR | Status: AC
  Filled 2011-03-12: qty 1

## 2011-03-12 MED ORDER — FUROSEMIDE 10 MG/ML IJ SOLN
40.0000 mg | Freq: Once | INTRAMUSCULAR | Status: AC
  Administered 2011-03-12: 40 mg via INTRAVENOUS
  Filled 2011-03-12: qty 4

## 2011-03-12 MED ORDER — HEPARIN (PORCINE) IN NACL 100-0.45 UNIT/ML-% IJ SOLN
INTRAMUSCULAR | Status: AC
  Administered 2011-03-12: 1000 [IU]/h via INTRAVENOUS
  Filled 2011-03-12: qty 250

## 2011-03-12 MED ORDER — NITROGLYCERIN IN D5W 200-5 MCG/ML-% IV SOLN: 5.0000 ug/min | INTRAVENOUS | Status: DC

## 2011-03-12 MED ORDER — MORPHINE SULFATE 4 MG/ML IJ SOLN
INTRAMUSCULAR | Status: AC
  Filled 2011-03-12: qty 1

## 2011-03-12 MED ORDER — ASPIRIN 81 MG PO CHEW
324.0000 mg | CHEWABLE_TABLET | Freq: Once | ORAL | Status: AC
  Administered 2011-03-12: 324 mg via ORAL
  Filled 2011-03-12: qty 4

## 2011-03-12 MED ORDER — PROMETHAZINE HCL 25 MG PO TABS: 25.0000 mg | ORAL_TABLET | Freq: Four times a day (QID) | ORAL | Status: DC | PRN

## 2011-03-12 MED ORDER — NITROGLYCERIN IN D5W 200-5 MCG/ML-% IV SOLN
5.0000 ug/min | INTRAVENOUS | Status: DC
  Administered 2011-03-12: 5 ug/min via INTRAVENOUS

## 2011-03-12 MED ORDER — HYDROCODONE-ACETAMINOPHEN 5-325 MG PO TABS: 1.0000 | ORAL_TABLET | ORAL | Status: DC | PRN

## 2011-03-12 MED ORDER — PROMETHAZINE HCL 25 MG/ML IJ SOLN
INTRAMUSCULAR | Status: AC
  Administered 2011-03-12: 12.5 mg via INTRAVENOUS
  Filled 2011-03-12: qty 1

## 2011-03-12 MED ORDER — MORPHINE SULFATE 4 MG/ML IJ SOLN
4.0000 mg | Freq: Once | INTRAMUSCULAR | Status: AC
  Administered 2011-03-12: 4 mg via INTRAVENOUS

## 2011-03-12 MED ORDER — PROMETHAZINE HCL 25 MG/ML IJ SOLN
12.5000 mg | Freq: Once | INTRAMUSCULAR | Status: AC
  Administered 2011-03-12: 12.5 mg via INTRAVENOUS

## 2011-03-12 MED ORDER — HEPARIN (PORCINE) IN NACL 100-0.45 UNIT/ML-% IJ SOLN
1000.0000 [IU]/h | Freq: Once | INTRAMUSCULAR | Status: AC
  Administered 2011-03-12: 1000 [IU]/h via INTRAVENOUS

## 2011-03-12 MED ORDER — NITROGLYCERIN 0.4 MG SL SUBL
0.4000 mg | SUBLINGUAL_TABLET | Freq: Once | SUBLINGUAL | Status: AC
  Administered 2011-03-12: 0.4 mg via SUBLINGUAL

## 2011-03-12 MED ORDER — NITROGLYCERIN 0.4 MG SL SUBL
0.4000 mg | SUBLINGUAL_TABLET | Freq: Once | SUBLINGUAL | Status: AC
  Administered 2011-03-12: 0.4 mg via SUBLINGUAL
  Filled 2011-03-12: qty 25

## 2011-03-12 MED ORDER — ONDANSETRON HCL 4 MG/2ML IJ SOLN
4.0000 mg | Freq: Once | INTRAMUSCULAR | Status: AC
  Administered 2011-03-12: 4 mg via INTRAVENOUS
  Filled 2011-03-12: qty 2

## 2011-03-12 NOTE — ED Notes (Signed)
Pt sleeping when i entered room, arouses with verbal stimuli, states that the chest pain is gone, headache is better as well, updated given to pt on delay in getting bed a [facility]. Pt expressed understanding,.

## 2011-03-12 NOTE — ED Notes (Signed)
Pt states that her chest pain is not any better, has actually gotten worse.  [place] PA notified,

## 2011-03-12 NOTE — ED Notes (Signed)
Was in room talking with pt, pt talking,had been pain free, began to experience n/v and chest pain returned with a rate of 6, [place] PA notified, additional orders given

## 2011-03-12 NOTE — Progress Notes (Signed)
Pt has a trichomonas infection that was not addressed due to recurrent vomiting and nausea.

## 2011-03-12 NOTE — ED Notes (Addendum)
[facility] here to transport pt to [phone]

## 2011-03-12 NOTE — Progress Notes (Signed)
  Medical screening examination/treatment/procedure(s) were performed by non-physician practitioner and as supervising physician I was immediately available for consultation/collaboration.     

## 2011-03-12 NOTE — ED Provider Notes (Signed)
History     CSN: [id number] Arrival date & time: 03/12/2011  7:11 AM  Chief Complaint  Patient presents with  . Nausea  . Emesis   HPI Comments: Pt states she was in ED yesterday 8/29 and treated for confusion, n/v. Today she returns because she continues to have vomiting. "I can't stop vomiting". She is now noticing a little blood in vomitus. Can't keep medications down. She continues to have pain of the stomach. She reports a little diarrhea and cough.  Patient is a 50 y.o. female presenting with vomiting.  Emesis  Pertinent negatives include no abdominal pain, no arthralgias and no cough.    Past Medical History  Diagnosis Date  . Hypertension   . Diverticulitis     Past Surgical History  Procedure Date  . Abdominal surgery   . Colonoscopy   . Colostomy   . Colostomy revision   . Cholecystectomy   . Tonsillectomy     Family History  Problem Relation Age of Onset  . Cancer Mother   . Cancer Other     History  Substance Use Topics  . Smoking status: Former Smoker    Quit date: 03/10/2009  . Smokeless tobacco: Never Used  . Alcohol Use: No    OB History    Grav Para Term Preterm Abortions TAB SAB Ect Mult Living   3 3 3       3       Review of Systems  Constitutional: Negative for activity change.       All ROS Neg except as noted in HPI  HENT: Negative for nosebleeds and neck pain.   Eyes: Negative for photophobia and discharge.  Respiratory: Negative for cough, shortness of breath and wheezing.   Cardiovascular: Negative for chest pain and palpitations.  Gastrointestinal: Positive for vomiting. Negative for abdominal pain and blood in stool.  Genitourinary: Negative for dysuria, frequency and hematuria.  Musculoskeletal: Negative for back pain and arthralgias.  Skin: Negative.   Neurological: Negative for dizziness, seizures and speech difficulty.  Psychiatric/Behavioral: Negative for hallucinations and confusion.    Physical Exam  BP 154/80   Pulse 90  Temp(Src) 99.1 F (37.3 C) (Oral)  Resp 20  Ht 5\' 2"  (1.575 m)  Wt 168 lb (76.204 kg)  BMI 30.73 kg/m2  SpO2 96%  Physical Exam  Nursing note and vitals reviewed. Constitutional: She is oriented to person, place, and time. She appears well-developed and well-nourished.  Non-toxic appearance.  HENT:  Head: Normocephalic.  Right Ear: Tympanic membrane and external ear normal.  Left Ear: Tympanic membrane and external ear normal.       Nasal congestion  Eyes: EOM and lids are normal. Pupils are equal, round, and reactive to light.  Neck: Normal range of motion. Neck supple. Carotid bruit is not present.  Cardiovascular: Normal rate, regular rhythm, normal heart sounds, intact distal pulses and normal pulses.   Pulmonary/Chest: No respiratory distress. She has wheezes. She has rhonchi in the right upper field, the right middle field, the right lower field and the left lower field. She has rales.  Abdominal: Soft. Bowel sounds are normal. She exhibits no pulsatile midline mass and no mass. There is tenderness in the right lower quadrant. There is no guarding and no CVA tenderness. A hernia is present.         RLQ pain. Not new, related to an abd hernia, but worse since multiple bouts of nausea  Musculoskeletal: Normal range of motion.  Lymphadenopathy:  Head (right side): No submandibular adenopathy present.       Head (left side): No submandibular adenopathy present.    She has no cervical adenopathy.  Neurological: She is alert and oriented to person, place, and time. She has normal strength. No cranial nerve deficit or sensory deficit.  Skin: Skin is warm and dry.  Psychiatric: She has a normal mood and affect. Her speech is normal.    ED Course - [phone], made aware of elevated trop and CK-MB. Stat EKG ordered. Cardiac monitoring previously ordered. EKG NSR at 77/min. No STEMI. 1041 - chest xray now returns with some pulm congestion. Pt questioned more extensively about  ANY chest pain. She now states that last night upon leaving ED she had pressure, "like something sitting on chest" 7/10. Pt thought this was related to recurrent vomiting. She did not sleep well last night. This AM the pressure sensation was still present at 8/10. After pain med pressure/pain down to 7/10. No vomiting, but some nausea still present.  Procedures  I have reviewed nursing notes, vital signs, and all appropriate lab and imaging results for this patient.     Date: 03/12/2011  Rate:77  Rhythm: normal sinus rhythm  QRS Axis: normal  Intervals: QT prolonged  ST/T Wave abnormalities: normal  Conduction Disutrbances:none  Narrative Interpretation:   Old EKG Reviewed: none available Results for orders placed during the hospital encounter of 03/12/11  CBC      Component Value Range   WBC 10.0  4.0 - 10.5 (K/uL)   RBC 3.65 (*) 3.87 - 5.11 (MIL/uL)   Hemoglobin 11.8 (*) 12.0 - 15.0 (g/dL)   HCT 96.0 (*) 45.4 - 46.0 (%)   MCV 98.4  78.0 - 100.0 (fL)   MCH 32.3  26.0 - 34.0 (pg)   MCHC 32.9  30.0 - 36.0 (g/dL)   RDW 09.8  11.9 - 14.7 (%)   Platelets 362  150 - 400 (K/uL)  DIFFERENTIAL      Component Value Range   Neutrophils Relative 78 (*) 43 - 77 (%)   Neutro Abs 7.8 (*) 1.7 - 7.7 (K/uL)   Lymphocytes Relative 15  12 - 46 (%)   Lymphs Abs 1.5  0.7 - 4.0 (K/uL)   Monocytes Relative 7  3 - 12 (%)   Monocytes Absolute 0.7  0.1 - 1.0 (K/uL)   Eosinophils Relative 0  0 - 5 (%)   Eosinophils Absolute 0.0  0.0 - 0.7 (K/uL)   Basophils Relative 0  0 - 1 (%)   Basophils Absolute 0.0  0.0 - 0.1 (K/uL)  BASIC METABOLIC PANEL      Component Value Range   Sodium 137  135 - 145 (mEq/L)   Potassium 3.8  3.5 - 5.1 (mEq/L)   Chloride 101  96 - 112 (mEq/L)   CO2 25  19 - 32 (mEq/L)   Glucose, Bld 113 (*) 70 - 99 (mg/dL)   BUN 15  6 - 23 (mg/dL)   Creatinine, Ser <8.29 (*) 0.50 - 1.10 (mg/dL)   Calcium 8.6  8.4 - 56.2 (mg/dL)   GFR calc non Af Amer NOT CALCULATED  >60 (mL/min)    GFR calc Af Amer NOT CALCULATED  >60 (mL/min)  URINALYSIS, ROUTINE W REFLEX MICROSCOPIC      Component Value Range   Color, Urine YELLOW  YELLOW    Appearance CLEAR  CLEAR    Specific Gravity, Urine >1.030 (*) 1.005 - 1.030    pH 6.0  5.0 -  8.0    Glucose, UA NEGATIVE  NEGATIVE (mg/dL)   Hgb urine dipstick TRACE (*) NEGATIVE    Bilirubin Urine SMALL (*) NEGATIVE    Ketones, ur 40 (*) NEGATIVE (mg/dL)   Protein, ur NEGATIVE  NEGATIVE (mg/dL)   Urobilinogen, UA 1.0  0.0 - 1.0 (mg/dL)   Nitrite NEGATIVE  NEGATIVE    Leukocytes, UA NEGATIVE  NEGATIVE   HEPATIC FUNCTION PANEL      Component Value Range   Total Protein 6.4  6.0 - 8.3 (g/dL)   Albumin 3.6  3.5 - 5.2 (g/dL)   AST 595 (*) 0 - 37 (U/L)   ALT 145 (*) 0 - 35 (U/L)   Alkaline Phosphatase 69  39 - 117 (U/L)   Total Bilirubin 0.5  0.3 - 1.2 (mg/dL)   Bilirubin, Direct 0.2  0.0 - 0.3 (mg/dL)   Indirect Bilirubin 0.3  0.3 - 0.9 (mg/dL)  LIPASE, BLOOD      Component Value Range   Lipase 19  11 - 59 (U/L)  PRO B NATRIURETIC PEPTIDE      Component Value Range   BNP, POC 1276.0 (*) 0 - 125 (pg/mL)  CARDIAC PANEL(CRET KIN+CKTOT+MB+TROPI)      Component Value Range   Total CK 3004 (*) 7 - 177 (U/L)   CK, MB 34.5 (*) 0.3 - 4.0 (ng/mL)   Troponin I 2.69 (*) <0.30 (ng/mL)   Relative Index 1.1  0.0 - 2.5   URINE MICROSCOPIC-ADD ON      Component Value Range   Squamous Epithelial / LPF RARE  RARE    WBC, UA 7-10  <3 (WBC/hpf)   RBC / HPF 3-6  <3 (RBC/hpf)   Bacteria, UA FEW (*) RARE    Urine-Other TRICHOMONAS PRESENT       Kathie Dike, [place] 03/12/11 [phone]

## 2011-03-12 NOTE — ED Notes (Signed)
Pt sleeping, pain free, update given

## 2011-03-12 NOTE — Progress Notes (Signed)
[facility] re contacted.  Still waiting on bed assignment.  Pt states she was pain free after sl ntg, but pain now coming back "a little". Sublingual NTG given. Tylenol for headache.

## 2011-03-12 NOTE — ED Notes (Signed)
Medical screening examination/treatment/procedure(s) were conducted as a shared visit with non-physician practitioner(s) and myself.  I personally evaluated the patient during the encounter  Donnetta Hutching, MD 03/12/11 2015

## 2011-03-12 NOTE — ED Notes (Signed)
[facility] putting in be request for Telemetry at [facility] and will call when bed is ready.

## 2011-03-12 NOTE — ED Notes (Signed)
Pt denies any chest pain at present, admits to headache that is rated as a 7, pt actively n/v, [place] PA notified, additional orders given,

## 2011-03-12 NOTE — ED Notes (Signed)
Report given to Gigi, RN

## 2011-03-12 NOTE — ED Notes (Signed)
[facility] called to get update on bed request for [facility]

## 2011-03-12 NOTE — ED Notes (Signed)
PA notified of pt's chest pain and headache, no additional orders given

## 2011-03-12 NOTE — ED Notes (Signed)
Contacted [facility] reference when a bed will be available, advised unknown time until bed will be ready

## 2011-03-12 NOTE — ED Notes (Signed)
Pt waiting on new [facility] assignment at [facility], still rates pain as a 3 on pain scale, pt sleeping when i entered the room, nausea is better, nitro drip rate is 10 mcg/min, nitro drip increased to 15 mcg/min due to continued chest pain,

## 2011-03-12 NOTE — ED Notes (Signed)
CRITICAL VALUE ALERT  Critical value received:  CKMB 34.5 and Troponin 2.69  Date of notification:  03/12/11  Time of notification:  0911  Critical value read back:yes  Nurse who received alert:  Abner Greenspan, RN  MD notified (1st page):  H. Beverely Pace, PA-C informed at [phone]

## 2011-03-12 NOTE — ED Notes (Signed)
Room assignment pt was given had to be changed due to nitro and heparin drip with continued chest pain, delay explained to pt, pt expressed understanding,

## 2011-03-12 NOTE — ED Notes (Signed)
Pt c/o chest pain that has started to return, pt rates it has a 2 on scale, states "its just a little bit" [place] PA notified,

## 2011-03-12 NOTE — ED Notes (Signed)
Report given to [facility] 

## 2011-03-12 NOTE — ED Notes (Signed)
Pt attempted to use bedpan, bedpan full of urine, pt also wet entire bed, bed linen and gown changed, pt cleaned,

## 2011-03-12 NOTE — ED Provider Notes (Signed)
History     CSN: [id number] Arrival date & time: 03/12/2011  7:11 AM  Chief Complaint  Patient presents with  . Nausea  . Emesis   HPI  Past Medical History  Diagnosis Date  . Hypertension   . Diverticulitis   . S/P ablation operation for arrhythmia     for WPW  . Wolf-Parkinson-White syndrome     Past Surgical History  Procedure Date  . Abdominal surgery   . Colonoscopy   . Colostomy   . Colostomy revision   . Cholecystectomy   . Tonsillectomy     Family History  Problem Relation Age of Onset  . Cancer Mother   . Cancer Other     History  Substance Use Topics  . Smoking status: Former Smoker    Quit date: 03/10/2009  . Smokeless tobacco: Never Used  . Alcohol Use: No    OB History    Grav Para Term Preterm Abortions TAB SAB Ect Mult Living   3 3 3       3       Review of Systems  Physical Exam  BP 118/78  Pulse 73  Temp(Src) 99.1 F (37.3 C) (Oral)  Resp 16  Ht 5\' 2"  (1.575 m)  Wt 168 lb (76.204 kg)  BMI 30.73 kg/m2  SpO2 95%  Physical Exam  ED Course  CRITICAL CARE Performed by: Donnetta Hutching Authorized by: Donnetta Hutching Total critical care time: 60 minutes Critical care was necessary to treat or prevent imminent or life-threatening deterioration of the following conditions: cardiac failure. Critical care was time spent personally by me on the following activities: discussions with consultants, evaluation of patient's response to treatment, obtaining history from patient or surrogate, ordering and review of laboratory studies, pulse oximetry, review of old charts, development of treatment plan with patient or surrogate, discussions with primary provider, examination of patient, ordering and performing treatments and interventions, ordering and review of radiographic studies and re-evaluation of patient's condition.    MDM Initial c/o of nausea;  Further investigation noted chest pain;  ekg normal;  Troponin elevated;  Discussed c cards;   transfer to [facility]  Medical screening examination/treatment/procedure(s) were performed by non-physician practitioner and as supervising physician I was immediately available for consultation/collaboration.   Donnetta Hutching, MD 03/12/11 2034

## 2011-03-12 NOTE — ED Notes (Signed)
Pt c/o continued n/v and ha. Pt seen in ed last night for same.

## 2011-03-12 NOTE — Progress Notes (Signed)
Case discussed with Dr Rexene Edison. Smith(card). He will accept pt. Request a telemetry bed.  Pt having good response from the lasix. THe vomiting has returned. Will obtain new EKG, and use phenergan for nausea.

## 2011-03-12 NOTE — ED Notes (Signed)
Nitro drip started at 81mcg/min with pain decreased to 3 on pain scale,

## 2011-03-12 NOTE — ED Notes (Signed)
[place] PA notified of pt's headache, no additional orders given

## 2011-03-12 NOTE — Progress Notes (Signed)
No vomiting at this time. Pt sleeping, but states she is nauseated and in pain when awakened.

## 2011-03-12 NOTE — ED Notes (Signed)
[facility] called to put in a

## 2011-03-12 NOTE — ED Provider Notes (Signed)
Medical screening examination/treatment/procedure(s) were conducted as a shared visit with non-physician practitioner(s) and myself.  I personally evaluated the patient during the encounter  Donnetta Hutching, MD 03/12/11 2035

## 2011-03-12 NOTE — ED Notes (Signed)
Patient drowsy. Arouses easily, but goes to sleep quickly during medication administration.

## 2011-03-12 NOTE — ED Notes (Signed)
Bed assignment moved to 2900

## 2011-03-12 NOTE — ED Notes (Signed)
Pt new room assignment given, 2908, pt states that her pain is better but still rates pain as a 6 which is more than the pain level with the last nitro change, pt sleeping at present, will arouse with verbal stimuli

## 2011-03-12 NOTE — ED Notes (Signed)
Patient placed on continuous cardiac monitoring, continuous pulse oximetry, and NBP cycling q 30 minutes.  

## 2011-03-13 LAB — CARDIAC PANEL(CRET KIN+CKTOT+MB+TROPI)
CK, MB: 13.1 ng/mL (ref 0.3–4.0)
CK, MB: 9.5 ng/mL (ref 0.3–4.0)
CK, MB: 7.3 ng/mL (ref 0.3–4.0)
Relative Index: 0.8 (ref 0.0–2.5)
Relative Index: 0.8 (ref 0.0–2.5)
Relative Index: 0.7 (ref 0.0–2.5)
Total CK: 1578 U/L — ABNORMAL HIGH (ref 7–177)
Total CK: 1199 U/L — ABNORMAL HIGH (ref 7–177)
Total CK: 987 U/L — ABNORMAL HIGH (ref 7–177)
Troponin I: 1.18 ng/mL (ref <0.30)
Troponin I: 1.22 ng/mL (ref <0.30)
Troponin I: 1.25 ng/mL (ref <0.30)

## 2011-03-13 LAB — COMPREHENSIVE METABOLIC PANEL
ALT: 97 U/L — ABNORMAL HIGH (ref 0–35)
AST: 71 U/L — ABNORMAL HIGH (ref 0–37)
Albumin: 3 g/dL — ABNORMAL LOW (ref 3.5–5.2)
Alkaline Phosphatase: 59 U/L (ref 39–117)
BUN: 14 mg/dL (ref 6–23)
CO2: 26 mEq/L (ref 19–32)
Calcium: 8.5 mg/dL (ref 8.4–10.5)
Chloride: 102 mEq/L (ref 96–112)
Creatinine, Ser: <0.47 mg/dL — ABNORMAL LOW (ref 0.50–1.10)
Glucose, Bld: 120 mg/dL — ABNORMAL HIGH (ref 70–99)
Potassium: 3.3 mEq/L — ABNORMAL LOW (ref 3.5–5.1)
Sodium: 140 mEq/L (ref 135–145)
Total Bilirubin: 0.5 mg/dL (ref 0.3–1.2)
Total Protein: 5.8 g/dL — ABNORMAL LOW (ref 6.0–8.3)

## 2011-03-13 LAB — LIPID PANEL
Cholesterol: 161 mg/dL (ref 0–200)
HDL: 45 mg/dL (ref >39)
LDL Cholesterol: 94 mg/dL (ref 0–99)
Total CHOL/HDL Ratio: 3.6 RATIO
Triglycerides: 109 mg/dL (ref <150)
VLDL: 22 mg/dL (ref 0–40)

## 2011-03-13 LAB — MRSA PCR SCREENING: MRSA by PCR: NEGATIVE

## 2011-03-13 LAB — CBC
HCT: 29 % — ABNORMAL LOW (ref 36.0–46.0)
Hemoglobin: 9.5 g/dL — ABNORMAL LOW (ref 12.0–15.0)
MCH: 31.6 pg (ref 26.0–34.0)
MCHC: 32.8 g/dL (ref 30.0–36.0)
MCV: 96.3 fL (ref 78.0–100.0)
Platelets: 332 10*3/uL (ref 150–400)
RBC: 3.01 MIL/uL — ABNORMAL LOW (ref 3.87–5.11)
RDW: 15 % (ref 11.5–15.5)
WBC: 7.7 10*3/uL (ref 4.0–10.5)

## 2011-03-13 LAB — PROTIME-INR
INR: 1.06 (ref 0.00–1.49)
Prothrombin Time: 14 seconds (ref 11.6–15.2)

## 2011-03-13 LAB — HEPARIN LEVEL (UNFRACTIONATED)
Heparin Unfractionated: <0.1 IU/mL — ABNORMAL LOW (ref 0.30–0.70)
Heparin Unfractionated: 1.3 IU/mL — ABNORMAL HIGH (ref 0.30–0.70)
Heparin Unfractionated: 0.15 IU/mL — ABNORMAL LOW (ref 0.30–0.70)

## 2011-03-13 LAB — TSH: TSH: 0.326 u[IU]/mL — ABNORMAL LOW (ref 0.350–4.500)

## 2011-03-14 DIAGNOSIS — I214 Non-ST elevation (NSTEMI) myocardial infarction: Secondary | ICD-10-CM

## 2011-03-14 LAB — CBC
HCT: 29.6 % — ABNORMAL LOW (ref 36.0–46.0)
Hemoglobin: 10.1 g/dL — ABNORMAL LOW (ref 12.0–15.0)
MCH: 32.4 pg (ref 26.0–34.0)
MCHC: 34.1 g/dL (ref 30.0–36.0)
MCV: 94.9 fL (ref 78.0–100.0)
Platelets: 362 10*3/uL (ref 150–400)
RBC: 3.12 MIL/uL — ABNORMAL LOW (ref 3.87–5.11)
RDW: 15 % (ref 11.5–15.5)
WBC: 8.3 10*3/uL (ref 4.0–10.5)

## 2011-03-14 LAB — BASIC METABOLIC PANEL
BUN: 13 mg/dL (ref 6–23)
CO2: 25 mEq/L (ref 19–32)
Calcium: 8.5 mg/dL (ref 8.4–10.5)
Chloride: 106 mEq/L (ref 96–112)
Creatinine, Ser: <0.47 mg/dL — ABNORMAL LOW (ref 0.50–1.10)
Glucose, Bld: 103 mg/dL — ABNORMAL HIGH (ref 70–99)
Potassium: 3.4 mEq/L — ABNORMAL LOW (ref 3.5–5.1)
Sodium: 141 mEq/L (ref 135–145)

## 2011-03-14 LAB — HEPATIC FUNCTION PANEL
ALT: 72 U/L — ABNORMAL HIGH (ref 0–35)
AST: 35 U/L (ref 0–37)
Albumin: 3 g/dL — ABNORMAL LOW (ref 3.5–5.2)
Alkaline Phosphatase: 53 U/L (ref 39–117)
Bilirubin, Direct: 0.2 mg/dL (ref 0.0–0.3)
Indirect Bilirubin: 0.3 mg/dL (ref 0.3–0.9)
Total Bilirubin: 0.5 mg/dL (ref 0.3–1.2)
Total Protein: 5.9 g/dL — ABNORMAL LOW (ref 6.0–8.3)

## 2011-03-14 LAB — IRON AND TIBC
Iron: 73 ug/dL (ref 42–135)
Saturation Ratios: 25 % (ref 20–55)
TIBC: 287 ug/dL (ref 250–470)
UIBC: 214 ug/dL (ref 125–400)

## 2011-03-14 LAB — FOLATE: Folate: 9.3 ng/mL

## 2011-03-14 LAB — T4, FREE: Free T4: 1.03 ng/dL (ref 0.80–1.80)

## 2011-03-14 LAB — T3, FREE: T3, Free: 2.1 pg/mL — ABNORMAL LOW (ref 2.3–4.2)

## 2011-03-14 LAB — FERRITIN: Ferritin: 68 ng/mL (ref 10–291)

## 2011-03-14 LAB — D-DIMER, QUANTITATIVE (NOT AT ARMC): D-Dimer, Quant: 1.47 ug/mL-FEU — ABNORMAL HIGH (ref 0.00–0.48)

## 2011-03-14 LAB — VITAMIN B12: Vitamin B-12: 969 pg/mL — ABNORMAL HIGH (ref 211–911)

## 2011-03-15 ENCOUNTER — Inpatient Hospital Stay (HOSPITAL_COMMUNITY): Payer: Self-pay

## 2011-03-15 LAB — BASIC METABOLIC PANEL
BUN: 10 mg/dL (ref 6–23)
CO2: 23 mEq/L (ref 19–32)
Calcium: 8.3 mg/dL — ABNORMAL LOW (ref 8.4–10.5)
Chloride: 103 mEq/L (ref 96–112)
Creatinine, Ser: <0.47 mg/dL — ABNORMAL LOW (ref 0.50–1.10)
Glucose, Bld: 109 mg/dL — ABNORMAL HIGH (ref 70–99)
Potassium: 4.7 mEq/L (ref 3.5–5.1)
Sodium: 136 mEq/L (ref 135–145)

## 2011-03-15 LAB — CBC
HCT: 31.8 % — ABNORMAL LOW (ref 36.0–46.0)
Hemoglobin: 10.6 g/dL — ABNORMAL LOW (ref 12.0–15.0)
MCH: 31.4 pg (ref 26.0–34.0)
MCHC: 33.3 g/dL (ref 30.0–36.0)
MCV: 94.1 fL (ref 78.0–100.0)
Platelets: 347 10*3/uL (ref 150–400)
RBC: 3.38 MIL/uL — ABNORMAL LOW (ref 3.87–5.11)
RDW: 14.5 % (ref 11.5–15.5)
WBC: 6.8 10*3/uL (ref 4.0–10.5)

## 2011-03-15 LAB — OCCULT BLOOD X 1 CARD TO LAB, STOOL: Fecal Occult Bld: POSITIVE

## 2011-03-15 MED ORDER — IOHEXOL 300 MG/ML  SOLN
100.0000 mL | Freq: Once | INTRAMUSCULAR | Status: AC | PRN
  Administered 2011-03-15: 100 mL via INTRAVENOUS

## 2011-03-17 ENCOUNTER — Other Ambulatory Visit: Payer: Self-pay | Admitting: Gastroenterology

## 2011-03-21 ENCOUNTER — Telehealth: Payer: Self-pay | Admitting: Internal Medicine

## 2011-03-21 MED ORDER — SUCRALFATE 1 GM/10ML PO SUSP: 1.0000 g | Freq: Four times a day (QID) | ORAL | Status: DC

## 2011-03-21 MED ORDER — TRAMADOL HCL 50 MG PO TABS: 50.0000 mg | ORAL_TABLET | Freq: Three times a day (TID) | ORAL | Status: AC | PRN

## 2011-03-21 NOTE — Telephone Encounter (Signed)
Patient called after recent hospital stay for abd pain, found to have reflux esophagitis, gastritis, gastric and duodenal ulcers. She continues to have abd pain, worse with eating.  Mild nausea and no vomiting.  Some heartburn still. No melena, no BRBPR. On pantoprazole 40 mg bid.  Also using prn TUMS. Tried APAP for pain, know to and is avoiding NSAIDs. APAP not helpful. Needs PCP, and GI followup.  This will likely be with Dr. Elnoria Howard, who recently performed EGD. Will call in rx for sucralfate 1g po QID for ulcer pain Also, tramadol 50 mg q8hprn pain #20. Advised pt to go to the ED if symptoms worsen or for any melena, rectal bleeding ,vomiting, fevers. She voiced understanding and thanked me for the call.

## 2011-03-23 NOTE — Telephone Encounter (Signed)
Finally found the number to [facility] in [place] at [facility] on [address] [phone]. Pt was given an appt with Dr Dan Humphreys on 04/02/11 at 0930am with Robin. This office is not set up to take [place] [facility], so I have tried to verify her insurance and it appears she has none. Zella Ball stated pt will need to bring $125 with her. I lmom for pt to call back.

## 2011-03-23 NOTE — Telephone Encounter (Signed)
Faxed and routed note to DR [facility] at [phone]

## 2011-03-23 NOTE — Discharge Summary (Signed)
Alison Walker, Alison Walker                 ACCOUNT NO.:  [id number]  MEDICAL RECORD NO.:  [id number]  LOCATION:  4705                         FACILITY:  [facility]  PHYSICIAN:  Georga Hacking, M.D.DATE OF BIRTH:  02/12/1961  DATE OF ADMISSION:  03/12/2011 DATE OF DISCHARGE:  03/16/2011                              DISCHARGE SUMMARY   FINAL DIAGNOSES: 1. Nausea, vomiting, and chest discomfort with elevated troponin.     a.     Catheterization showing no significant coronary artery      disease. 2. Endoscopy showing small duodenal ulcer and stage C reflux     esophagitis. 3. Duodenal ulcer acute and reflux esophagitis grade C, also small     gastric ulcers. 4. Lower abdominal hernia. 5. Hypertension. 6. Rhabdomyolysis, resolving. 7. Anemia, likely due to ulcers. 8. Hypokalemia, treated.  PROCEDURES:  Cardiac catheterization, upper endoscopy, and CT angiogram of chest.  HISTORY:  The patient is a 50 year old female with known hypertension who has had previous ablation for WPW.  She came to the hospital with complaints of chest pain, nausea, and vomiting and was found to have an elevated troponin and was admitted to the hospital.  Please see the previously dictated history and physical for remainder of the details.  HOSPITAL COURSE:  The patient was transferred to the [facility].  Her echocardiogram showed normal left ventricular function with EF of 60- 65%, mild left atrial enlargement, lipomatous hypertrophy of the atrial septum.  Laboratory data showed white count of 11,500, hemoglobin 11.3, hematocrit of 34.4.  BUN was 24, creatinine is 0.6.  Initial CPK was 3004 with an MB of 34.5, but normal index.  Troponin is 2.68.  B- natriuretic peptide is 1276.  Cholesterol is 161, triglycerides 109, HDL is 45, LDL is 94.  TSH is 0.326.  Iron is 73, TIBC is 287, percent saturation is 25%, B12 is 969, folate was normal.  Drug screen was positive for marijuana and benzodiazepines.  The  patient was admitted to the hospital and initially placed on heparin.  A catheterization done the next day showed no significant coronary artery disease and normal left ventricular function, mild elevation of LVEDP.  Heparin and nitro were discontinued.  She continued to feel poorly over the weekend and had significant acute illness with emesis and increased CPK and troponin and impaired mental status.  A GI consult was obtained and when she underwent endoscopy on the day of discharge showing small gastric ulcers, a small duodenal ulcer, and grade C esophagitis with hiatal hernia.  It was recommended that she have high-dose PPI therapy and follow up with [facility].  Cardiac workup was negative.  A CT angiogram of the chest showed no evidence of pulmonary emboli.  A CT scan of the abdomen and pelvis showed no focal liver lesions, possible atelectasis, small hiatal hernia, and some degenerative changes of the spine, and lower abdominal wall hernia containing loops of small bowel with no evidence of obstruction or incarceration.  Chest x-ray showed cardiac enlargement.  She was placed on high-dose PPI therapy.  Her hemoglobin remained stable and it was opted to discharge her in improved condition.  It was thought that  she may have had some rhabdomyolysis and her statin will be discontinued at this time also.  Despite administration of statin, her CPK was resolving at the time of discharge.  CPK had fallen to 987.  Troponin remained slightly elevated.  She is discharged at this time on pantoprazole 40 mg b.i.d.  She is to discontinue aspirin.  She is to continue metoprolol 25 b.i.d. for blood pressure and continue lisinopril 10 mg and is to begin with lisinopril 10 mg daily for control of hypertension.  She is to discontinue Crestor.  She is to take pantoprazole 40 mg b.i.d.  She is to follow up with Dr. Katrinka Blazing in 1 week and is to follow up with [facility].  She is instructed not to use  nonsteroidal anti-inflammatories or aspirin.     Georga Hacking, M.D.     WST/MEDQ  D:  03/16/2011  T:  03/16/2011  Job:  [id number]  cc:   Lyn Records, M.D.  Electronically Signed by Lacretia Nicks. Donnie Aho M.D. on 03/23/2011 03:51:17 PM

## 2011-03-25 NOTE — Cardiovascular Report (Signed)
  NAMEKIMRA, Alison                 ACCOUNT NO.:  [id number]  MEDICAL RECORD NO.:  [id number]  LOCATION:  6527                         FACILITY:  [facility]  PHYSICIAN:  Corky Crafts, MDDATE OF BIRTH:  03-05-1961  DATE OF PROCEDURE:  03/13/2011 DATE OF DISCHARGE:                           CARDIAC CATHETERIZATION   PROCEDURES PERFORMED: 1. Left heart catheterization. 2. Left ventriculogram. 3. Coronary angiogram. 4. Abdominal aortogram.  OPERATOR:  Corky Crafts, MD  INDICATIONS:  Non-ST-elevation MI.  PROCEDURE NARRATIVE:  The risks and benefits of cardiac catheterization were explained to the patient and informed consent was obtained.  She brought to the cath lab.  She was prepped and draped in the usual sterile fashion.  Her right wrist was infiltrated with 1% lidocaine.  A 5-French sheath was placed into the right common femoral artery using modified Seldinger technique.  Right coronary artery angiography was performed using a JR-4.0 catheter.  The catheter was advanced through the vessel ostium under fluoroscopic guidance.  Digital angiography was performed in multiple projections using hand injection of contrast. Left coronary artery angiography was performed using a JL-3.5 catheter in a similar fashion.  Pigtail catheter was advanced to the ascending aorta and across the aortic valve under fluoroscopic guidance.  Power injection of contrast was performed in the RAO projection to image the left ventricle.  Catheter was pulled back under continuous hemodynamic pressure monitoring, catheter was advanced into the abdominal aorta and power injection of contrast was performed in AP projection.  The sheath was removed and a TR band was used for hemostasis.  FINDINGS:  The right coronary artery is a large dominant vessel.  There is a large PLA and posterior descending artery, both of which are widely patent.  The entire right coronary artery system  appears angiographically normal. Left main appeared widely patent. Left circumflex is a medium-sized.  OM-1 and OM-2 are medium-sized and widely patent. Left anterior descending is a large vessel which stops before the apex. First, second, and third diagonals are all widely patent. Left ventriculogram showed normal ventricular function.  HEMODYNAMIC RESULTS:  Left ventricular pressure 137/17 with an LVEDP of 22 mmHg.  Aortic pressure 128/74 with a mean aortic pressure of 98 mmHg. The abdominal aortogram showed no abdominal aortic aneurysm.  There are bilateral single renal arteries.  IMPRESSION: 1. No coronary artery disease. 2. Normal left ventricular function. 3. No abdominal aortic aneurysm.  RECOMMENDATIONS:  Discontinue heparin and nitroglycerin.  Continue preventive therapy.  She needs continued workup of her nausea and vomiting and elevated LFTs.     Corky Crafts, MD     JSV/MEDQ  D:  03/13/2011  T:  03/13/2011  Job:  [id number]  Electronically Signed by Lance Muss MD on 03/25/2011 12:30:15 PM

## 2011-03-27 NOTE — Consult Note (Signed)
Alison Walker, Alison Walker                 ACCOUNT NO.:  [id number]  MEDICAL RECORD NO.:  [id number]  LOCATION:  4705                         FACILITY:  [facility]  PHYSICIAN:  Jordan Hawks. Elnoria Howard, MD    DATE OF BIRTH:  Nov 21, 1960  DATE OF CONSULTATION:  03/15/2011 DATE OF DISCHARGE:                                CONSULTATION   REFERRING PHYSICIAN:   Cardiology.  HISTORY OF PRESENT ILLNESS:  This is a 50 year old female with a past medical history of hypertension, status post ablation for [employer], status post cholecystectomy and history of partial colectomy for diverticulitis, admitted to the hospital with complaints of chest pain, nausea and vomiting.  The patient apparently was sought medical care initially.  When she was at [facility], she was treated medically and subsequently sent home.  Unfortunately, her symptoms continued to persist through the last Thursday and she presented to the emergency room for further evaluation and treatment. Again at that time, she is noted to have elevation in her liver enzymes as well as an elevation in her troponin.  As a result, she was transferred over to [facility] for further evaluation and treatment.  She underwent a cardiac catheterization and was essentially negative, but despite the findings of the cardiac catheterization, she continues to have symptoms of nausea, vomiting, and some abdominal pain. She has a history gastroesophageal reflux in the past and she was provided with Nexium, but she never follow up with her primary care provider to obtain further refills.  She takes Tums on a routine basis. Per her report, she does not feel that her current symptoms are similar to her acid reflux issues.  No complaints of any dysphagia.  She does have a mild anemia.  PAST MEDICAL HISTORY AND PAST SURGICAL HISTORY:  As stated above.  FAMILY HISTORY:  Noncontributory.  SOCIAL HISTORY:  Negative for any alcohol,  tobacco, or illicit drug use.  REVIEW OF SYSTEMS:  As stated above in the history of present illness, otherwise negative.  MEDICATIONS: 1. Aspirin 81 mg p.o. daily. 2. Metoprolol 25 mg p.o. b.i.d. 3. Crestor 20 mg p.o. daily. 4. Morphine 2 mg IV q.2 h. 5. Zofran 4 mg IV q.6 h. 6. Phenergan 12.5 mg IV q.4 h. 7. Ambien 10 mg p.o. nightly.  ALLERGIES:  No known drug allergies.  PHYSICAL EXAMINATION:  VITAL SIGNS:  Blood pressure is 151/79, heart rate is 58, respirations 17, temperature is 98.4. GENERAL:  The patient is in no acute distress, alert and oriented. HEENT:  Normocephalic, atraumatic.  Extraocular muscles intact. NECK:  Supple.  No lymphadenopathy. LUNGS:  Clear to auscultation bilaterally. CARDIOVASCULAR:  Regular rate and rhythm. ABDOMEN:  Obese, soft, tender in the left upper quadrant region.  No rebound or rigidity.  Positive bowel sounds. EXTREMITIES:  No clubbing, cyanosis, or edema.  LABORATORY VALUES:  White blood cell count 6.8, hemoglobin 10.6, MCV is 94.1, platelets are 347.  Sodium 136, potassium 4.7, chloride 103, CO2 23, glucose 109, BUN 10, creatinine is 0.4.  IMPRESSION: 1. Nausea and vomiting. 2. Left upper quadrant pain. 3. Resolved congestive heart failure.  I am uncertain about the patient's symptoms and I  am suspicious that she may have gastroesophageal reflux disease, although she states that it does not feel the same.  There was no reports of any dysphagia or odynophagia, but regardless I think a further evaluation with EGD will be a beneficial to help to elucidate her symptoms.  She was noted to have some abnormal liver enzymes, but this is improving at this time and it was felt to be secondary to congestive heart failure, her BNP was at 1276.  PLAN:  Plan at this time is for EGD and then further recommendations will be made pending the findings.     Jordan Hawks Elnoria Howard, MD     PDH/MEDQ  D:  03/15/2011  T:  03/15/2011  Job:   [id number]  cc:   [facility]  Electronically Signed by Jeani Hawking MD on 03/27/2011 08:49:33 AM

## 2011-03-27 NOTE — Telephone Encounter (Signed)
Spoke with Robin in [place] and she hasn't heard from pt.  I was going to write a letter, but called the number and a female answered who asked that I call back and leave a message on the machine. Left message stating she has an appt with Dr Dan Humphreys on 04/02/11 at 0930am. Left her the address and # . Informed her if she doesn't have insurance, she needs to bring $125.

## 2011-04-02 ENCOUNTER — Ambulatory Visit: Payer: Self-pay | Admitting: Internal Medicine

## 2011-04-02 DIAGNOSIS — Z0289 Encounter for other administrative examinations: Secondary | ICD-10-CM

## 2011-05-07 NOTE — H&P (Signed)
NAMEGERARDO, TERRITO                 ACCOUNT NO.:  [id number]  MEDICAL RECORD NO.:  [id number]  LOCATION:  2908                         FACILITY:  [facility]  PHYSICIAN:  Therisa Doyne, MD    DATE OF BIRTH:  [birth year]  DATE OF ADMISSION:  03/12/2011 DATE OF DISCHARGE:                             HISTORY & PHYSICAL   PRIMARY CARE PROVIDER:  None.  CHIEF COMPLAINT:  Chest pain, nausea, vomiting and an elevated troponin of 2.7.  HISTORY OF PRESENT ILLNESS:  A 50 year old white female with a past medical history significant for hypertension who presents for evaluation of nausea, vomiting, chest pain and elevated troponin.  She states that she was in her usual state of health until Tuesday when she began feeling "sick on her stomach."  She had multiple episodes of emesis that day approximately 4-5 times.  After the emesis, she had chest burning with left-sided chest pressure.  The chest pressure was present off and on over the next 2 days.  She continued to have multiple episodes of emesis over the next 48 hours.  She sought medical care on Wednesday at the emergency department where routine lab studies demonstrated a normal lipase, but elevated AST and ALT in the 200s.  She was medically treated and sent home.  She was then came back to the emergency department on Thursday morning at [facility] because of continued nausea and vomiting. She continued to have ongoing intermittent left-sided chest pain and a troponin was checked, which was elevated at 2.7 because that she was given aspirin, heparin and a nitroglycerin drip and was transferred to [facility] for further evaluation.  Upon my evaluation, she is actively vomiting, nonbloody, and nonbilious emesis; however, she does not have any chest pain.  PAST MEDICAL HISTORY: 1. Hypertension. 2. History of ablation for WPW. 3. Status post cholecystectomy. 4. History of colectomy secondary to diverticulitis.  SOCIAL HISTORY:   The patient is currently unemployed.  She has remote history of smoking, but does not currently smoke.  She denies any alcohol use.  FAMILY HISTORY:  Negative for premature coronary artery disease.  ALLERGIES:  No known drug allergies.  MEDICATIONS (UNKNOWN DOSAGES): 1. Atenolol. 2. Hydrochlorothiazide.  REVIEW OF SYSTEMS:  All systems were reviewed and are negative except as mentioned above in the history of present illness.  PHYSICAL EXAMINATION:  VITAL SIGNS:  Temperature afebrile, blood pressure is 130/80, pulse is 90, respirations 89, oxygen saturations 96% on room air. GENERAL:  No acute distress. HEENT:  Normocephalic and atraumatic.  Pupils are equal, round and reactive to light and accommodation.  Oropharynx reveals dry mucous membranes. NECK:  Supple.  No lymphadenopathy.  No jugular venous distention.  No masses. CARDIOVASCULAR:  Regular rate and rhythm with no murmurs, rubs, or gallops. CHEST:  Clear to auscultation bilaterally. ABDOMEN:  Positive bowel sounds, diffusely tender to palpation.  Norebound or guarding. EXTREMITIES:  Trace lower extremity edema. SKIN:  No rashes. NEUROLOGIC:  No focal deficits. PSYCHIATRY:  Normal affect.  PERTINENT LAB DATA:  White blood cell count 1.5, hemoglobin 11.3, platelets 393.  CMP is notable for a BUN of 24, creatinine of 0.6, total bilirubin 0.3,  alkaline phosphatase 73, AST 143, ALT 145, lipase 11. BNP 1076.  CK 3004, CK-MB 35, troponin 2.7.  EKG from this evening at 2127 demonstrates sinus rhythm with sinus arrhythmia at 63 beats per minute, prolonged QT at 462 milliseconds, no signs of ischemia.  Chest x-ray showed mild cardiomegaly.  CT scan showed no pleural or pericardial effusions.  There was no acute abdominal findings.  CT of the head was negative for any intracranial abnormalities.  ASSESSMENT AND PLAN:  A 50 year old white female with a past medical history significant for hypertension who presents for  evaluation of nausea, vomiting, and chest pain for 2 days and now has an elevated troponin of 2.6. 1. We will admit the patient to [facility] under Dr. Michaelle Copas     care. 2. Elevated troponin.  The differential diagnosis includes a non-ST     segment elevation myocardial infarction (late presentation) versus     viral myocarditis versus Takotsubo cardiomyopathy.  Certainly with     her presentation, this could be consistent with viral     gastroenteritis with potential viral myocarditis.  Certainly, the     nausea and vomiting could have caused her to have a stress-induced     cardiomyopathy as she has elevated troponin and a prolonged QT,     which goes along with a stress-induced cardiomyopathy.  Regardless,     we will continue her medical management as potential acute coronary     syndrome with aspirin, IV heparin, IV nitroglycerin, Lopressor 25     mg b.i.d. and empiric Lipitor therapy.  For her volume depletion     and decreased oral intake, we will give her IV fluids, normal     saline and cycle cardiac enzymes.  We will check an echocardiogram     to assess for regional wall motion abnormalities.  At this time,     there is no indication for an emergent cardiac catheterization as     she is chest painfree and first presentation is greater than 48     hours ago.  However, depending on what her cardiac enzymes do and     her clinical course, she may need a coronary catheterization just     to define her coronary anatomy and to definitively say whether or     not this is an acute coronary syndrome. 3. Nausea and vomiting.  I suspect this is viral gastroenteritis.  We     will give her clear liquid diet, as needed Phenergan because the     Zofran was not working.  We will give her volume and normal saline     and as-needed morphine. 4. Hypertension.  We have placed the patient on Lopressor.  We will     hold her hydrochlorothiazide as she is mildly volume depleted. 5. Elevated  LFTs, I suspect that this may be related to her     gastrointestinal illness.  We will repeat a CMP and trend her LFTs     to ensure that they are downtrending. 6. Fluids, electrolytes, nutrition.  Normal saline at 100 mL/hour,     electrolytes are pending, clear liquid diet, and then n.p.o. after     midnight. 7. Deep venous thrombosis prophylaxis, not indicated as the patient is     on systemic anticoagulation with heparin.     Therisa Doyne, MD     SJT/MEDQ  D:  03/12/2011  T:  03/12/2011  Job:  [id number]  Electronically Signed by  Aldona Bar MD on 05/07/2011 06:08:31 AM

## 2011-05-17 ENCOUNTER — Emergency Department: Payer: Self-pay | Admitting: Unknown Physician Specialty

## 2011-10-18 ENCOUNTER — Emergency Department: Payer: Self-pay | Admitting: Emergency Medicine

## 2011-10-18 LAB — URINALYSIS, COMPLETE
Bacteria: NONE SEEN
Bilirubin,UR: NEGATIVE
Blood: NEGATIVE
Glucose,UR: NEGATIVE mg/dL (ref 0–75)
Ketone: NEGATIVE
Nitrite: NEGATIVE
Ph: 6 (ref 4.5–8.0)
Protein: NEGATIVE
RBC,UR: <1 /HPF (ref 0–5)
Specific Gravity: 1.002 (ref 1.003–1.030)
Squamous Epithelial: <1
WBC UR: 1 /HPF (ref 0–5)

## 2011-10-18 LAB — COMPREHENSIVE METABOLIC PANEL
Albumin: 3.7 g/dL (ref 3.4–5.0)
Alkaline Phosphatase: 61 U/L (ref 50–136)
Anion Gap: 8 (ref 7–16)
BUN: 14 mg/dL (ref 7–18)
Bilirubin,Total: 0.2 mg/dL (ref 0.2–1.0)
Calcium, Total: 8.7 mg/dL (ref 8.5–10.1)
Chloride: 107 mmol/L (ref 98–107)
Co2: 27 mmol/L (ref 21–32)
Creatinine: 0.63 mg/dL (ref 0.60–1.30)
EGFR (African American): >60
EGFR (Non-African Amer.): >60
Glucose: 78 mg/dL (ref 65–99)
Osmolality: 282 (ref 275–301)
Potassium: 4 mmol/L (ref 3.5–5.1)
SGOT(AST): 21 U/L (ref 15–37)
SGPT (ALT): 14 U/L
Sodium: 142 mmol/L (ref 136–145)
Total Protein: 7 g/dL (ref 6.4–8.2)

## 2011-10-18 LAB — CBC
HCT: 37.9 % (ref 35.0–47.0)
HGB: 12.8 g/dL (ref 12.0–16.0)
MCH: 32.2 pg (ref 26.0–34.0)
MCHC: 33.7 g/dL (ref 32.0–36.0)
MCV: 96 fL (ref 80–100)
Platelet: 360 10*3/uL (ref 150–440)
RBC: 3.96 10*6/uL (ref 3.80–5.20)
RDW: 14.7 % — ABNORMAL HIGH (ref 11.5–14.5)
WBC: 7.6 10*3/uL (ref 3.6–11.0)

## 2011-10-18 LAB — LIPASE, BLOOD: Lipase: 66 U/L — ABNORMAL LOW (ref 73–393)

## 2011-12-29 ENCOUNTER — Inpatient Hospital Stay: Payer: Self-pay | Admitting: Internal Medicine

## 2011-12-29 LAB — PROTIME-INR
INR: 0.9
Prothrombin Time: 12.3 secs (ref 11.5–14.7)

## 2011-12-29 LAB — COMPREHENSIVE METABOLIC PANEL
Albumin: 3.3 g/dL — ABNORMAL LOW (ref 3.4–5.0)
Alkaline Phosphatase: 78 U/L (ref 50–136)
Anion Gap: 5 — ABNORMAL LOW (ref 7–16)
BUN: 21 mg/dL — ABNORMAL HIGH (ref 7–18)
Bilirubin,Total: 0.3 mg/dL (ref 0.2–1.0)
Calcium, Total: 8.7 mg/dL (ref 8.5–10.1)
Chloride: 106 mmol/L (ref 98–107)
Co2: 28 mmol/L (ref 21–32)
Creatinine: 1.24 mg/dL (ref 0.60–1.30)
EGFR (African American): 58 — ABNORMAL LOW
EGFR (Non-African Amer.): 50 — ABNORMAL LOW
Glucose: 87 mg/dL (ref 65–99)
Osmolality: 280 (ref 275–301)
Potassium: 4 mmol/L (ref 3.5–5.1)
SGOT(AST): 15 U/L (ref 15–37)
SGPT (ALT): 14 U/L
Sodium: 139 mmol/L (ref 136–145)
Total Protein: 6.9 g/dL (ref 6.4–8.2)

## 2011-12-29 LAB — URINALYSIS, COMPLETE
Bacteria: NONE SEEN
Bilirubin,UR: NEGATIVE
Blood: NEGATIVE
Glucose,UR: NEGATIVE mg/dL (ref 0–75)
Hyaline Cast: 1
Ketone: NEGATIVE
Leukocyte Esterase: NEGATIVE
Nitrite: NEGATIVE
Ph: 5 (ref 4.5–8.0)
Protein: NEGATIVE
RBC,UR: NONE SEEN /HPF (ref 0–5)
Specific Gravity: 1.013 (ref 1.003–1.030)
Squamous Epithelial: 2
WBC UR: 1 /HPF (ref 0–5)

## 2011-12-29 LAB — CBC
HCT: 35.8 % (ref 35.0–47.0)
HGB: 11.6 g/dL — ABNORMAL LOW (ref 12.0–16.0)
MCH: 30.7 pg (ref 26.0–34.0)
MCHC: 32.3 g/dL (ref 32.0–36.0)
MCV: 95 fL (ref 80–100)
Platelet: 343 10*3/uL (ref 150–440)
RBC: 3.77 10*6/uL — ABNORMAL LOW (ref 3.80–5.20)
RDW: 14.5 % (ref 11.5–14.5)
WBC: 9.2 10*3/uL (ref 3.6–11.0)

## 2011-12-29 LAB — LIPASE, BLOOD: Lipase: 112 U/L (ref 73–393)

## 2011-12-30 LAB — CBC WITH DIFFERENTIAL/PLATELET
Basophil #: 0 10*3/uL (ref 0.0–0.1)
Basophil %: 0.9 %
Eosinophil #: 0.2 10*3/uL (ref 0.0–0.7)
Eosinophil %: 3.3 %
HCT: 33.7 % — ABNORMAL LOW (ref 35.0–47.0)
HGB: 11 g/dL — ABNORMAL LOW (ref 12.0–16.0)
Lymphocyte #: 1.8 10*3/uL (ref 1.0–3.6)
Lymphocyte %: 35.2 %
MCH: 31.5 pg (ref 26.0–34.0)
MCHC: 32.8 g/dL (ref 32.0–36.0)
MCV: 96 fL (ref 80–100)
Monocyte #: 0.6 x10 3/mm (ref 0.2–0.9)
Monocyte %: 11.6 %
Neutrophil #: 2.5 10*3/uL (ref 1.4–6.5)
Neutrophil %: 49 %
Platelet: 277 10*3/uL (ref 150–440)
RBC: 3.51 10*6/uL — ABNORMAL LOW (ref 3.80–5.20)
RDW: 15 % — ABNORMAL HIGH (ref 11.5–14.5)
WBC: 5.1 10*3/uL (ref 3.6–11.0)

## 2011-12-30 LAB — COMPREHENSIVE METABOLIC PANEL
Albumin: 2.7 g/dL — ABNORMAL LOW (ref 3.4–5.0)
Alkaline Phosphatase: 66 U/L (ref 50–136)
Anion Gap: 8 (ref 7–16)
BUN: 11 mg/dL (ref 7–18)
Bilirubin,Total: 0.4 mg/dL (ref 0.2–1.0)
Calcium, Total: 8.1 mg/dL — ABNORMAL LOW (ref 8.5–10.1)
Chloride: 106 mmol/L (ref 98–107)
Co2: 24 mmol/L (ref 21–32)
Creatinine: 0.62 mg/dL (ref 0.60–1.30)
EGFR (African American): >60
EGFR (Non-African Amer.): >60
Glucose: 103 mg/dL — ABNORMAL HIGH (ref 65–99)
Osmolality: 275 (ref 275–301)
Potassium: 3.7 mmol/L (ref 3.5–5.1)
SGOT(AST): 18 U/L (ref 15–37)
SGPT (ALT): 13 U/L
Sodium: 138 mmol/L (ref 136–145)
Total Protein: 5.9 g/dL — ABNORMAL LOW (ref 6.4–8.2)

## 2011-12-31 LAB — URINE CULTURE

## 2012-01-01 LAB — CBC WITH DIFFERENTIAL/PLATELET
Basophil #: 0 10*3/uL (ref 0.0–0.1)
Basophil %: 0.7 %
Eosinophil #: 0.1 10*3/uL (ref 0.0–0.7)
Eosinophil %: 1.5 %
HCT: 32.5 % — ABNORMAL LOW (ref 35.0–47.0)
HGB: 11 g/dL — ABNORMAL LOW (ref 12.0–16.0)
Lymphocyte #: 1.4 10*3/uL (ref 1.0–3.6)
Lymphocyte %: 27.6 %
MCH: 31.8 pg (ref 26.0–34.0)
MCHC: 33.9 g/dL (ref 32.0–36.0)
MCV: 94 fL (ref 80–100)
Monocyte #: 0.5 x10 3/mm (ref 0.2–0.9)
Monocyte %: 10 %
Neutrophil #: 3 10*3/uL (ref 1.4–6.5)
Neutrophil %: 60.2 %
Platelet: 331 10*3/uL (ref 150–440)
RBC: 3.46 10*6/uL — ABNORMAL LOW (ref 3.80–5.20)
RDW: 14.3 % (ref 11.5–14.5)
WBC: 5 10*3/uL (ref 3.6–11.0)

## 2012-01-04 LAB — CULTURE, BLOOD (SINGLE)

## 2012-01-15 ENCOUNTER — Emergency Department: Payer: Self-pay | Admitting: Emergency Medicine

## 2012-01-15 LAB — BASIC METABOLIC PANEL
Anion Gap: 5 — ABNORMAL LOW (ref 7–16)
BUN: 15 mg/dL (ref 7–18)
Calcium, Total: 9.5 mg/dL (ref 8.5–10.1)
Chloride: 101 mmol/L (ref 98–107)
Co2: 32 mmol/L (ref 21–32)
Creatinine: 0.68 mg/dL (ref 0.60–1.30)
EGFR (African American): >60
EGFR (Non-African Amer.): >60
Glucose: 79 mg/dL (ref 65–99)
Osmolality: 275 (ref 275–301)
Potassium: 4.3 mmol/L (ref 3.5–5.1)
Sodium: 138 mmol/L (ref 136–145)

## 2012-01-15 LAB — CBC WITH DIFFERENTIAL/PLATELET
Basophil #: 0.1 10*3/uL (ref 0.0–0.1)
Basophil %: 1.3 %
Eosinophil #: 0.2 10*3/uL (ref 0.0–0.7)
Eosinophil %: 3.7 %
HCT: 36.6 % (ref 35.0–47.0)
HGB: 12.3 g/dL (ref 12.0–16.0)
Lymphocyte #: 2.1 10*3/uL (ref 1.0–3.6)
Lymphocyte %: 35.7 %
MCH: 32 pg (ref 26.0–34.0)
MCHC: 33.6 g/dL (ref 32.0–36.0)
MCV: 95 fL (ref 80–100)
Monocyte #: 0.6 x10 3/mm (ref 0.2–0.9)
Monocyte %: 10.6 %
Neutrophil #: 2.9 10*3/uL (ref 1.4–6.5)
Neutrophil %: 48.7 %
Platelet: 376 10*3/uL (ref 150–440)
RBC: 3.84 10*6/uL (ref 3.80–5.20)
RDW: 15.1 % — ABNORMAL HIGH (ref 11.5–14.5)
WBC: 5.9 10*3/uL (ref 3.6–11.0)

## 2012-01-15 LAB — URINALYSIS, COMPLETE
Bacteria: NONE SEEN
Bilirubin,UR: NEGATIVE
Blood: NEGATIVE
Glucose,UR: NEGATIVE mg/dL (ref 0–75)
Ketone: NEGATIVE
Leukocyte Esterase: NEGATIVE
Nitrite: NEGATIVE
Ph: 8 (ref 4.5–8.0)
Protein: NEGATIVE
RBC,UR: NONE SEEN /HPF (ref 0–5)
Specific Gravity: 1.005 (ref 1.003–1.030)
Squamous Epithelial: <1
WBC UR: <1 /HPF (ref 0–5)

## 2012-02-06 ENCOUNTER — Inpatient Hospital Stay: Payer: Self-pay | Admitting: Internal Medicine

## 2012-02-06 LAB — URINALYSIS, COMPLETE
Bacteria: NONE SEEN
Bilirubin,UR: NEGATIVE
Blood: NEGATIVE
Glucose,UR: NEGATIVE mg/dL (ref 0–75)
Hyaline Cast: 10
Leukocyte Esterase: NEGATIVE
Nitrite: NEGATIVE
Ph: 5 (ref 4.5–8.0)
Protein: NEGATIVE
RBC,UR: NONE SEEN /HPF (ref 0–5)
Specific Gravity: 1.017 (ref 1.003–1.030)
Squamous Epithelial: 1
WBC UR: NONE SEEN /HPF (ref 0–5)

## 2012-02-06 LAB — CBC
HCT: 51.1 % — ABNORMAL HIGH (ref 35.0–47.0)
HGB: 17.4 g/dL — ABNORMAL HIGH (ref 12.0–16.0)
MCH: 31.8 pg (ref 26.0–34.0)
MCHC: 34 g/dL (ref 32.0–36.0)
MCV: 94 fL (ref 80–100)
Platelet: 427 10*3/uL (ref 150–440)
RBC: 5.47 10*6/uL — ABNORMAL HIGH (ref 3.80–5.20)
RDW: 15.2 % — ABNORMAL HIGH (ref 11.5–14.5)
WBC: 20.6 10*3/uL — ABNORMAL HIGH (ref 3.6–11.0)

## 2012-02-06 LAB — COMPREHENSIVE METABOLIC PANEL
Albumin: 3.2 g/dL — ABNORMAL LOW (ref 3.4–5.0)
Alkaline Phosphatase: 187 U/L — ABNORMAL HIGH (ref 50–136)
Anion Gap: 15 (ref 7–16)
BUN: 51 mg/dL — ABNORMAL HIGH (ref 7–18)
Bilirubin,Total: 0.7 mg/dL (ref 0.2–1.0)
Calcium, Total: 8.7 mg/dL (ref 8.5–10.1)
Chloride: 107 mmol/L (ref 98–107)
Co2: 16 mmol/L — ABNORMAL LOW (ref 21–32)
Creatinine: 1.72 mg/dL — ABNORMAL HIGH (ref 0.60–1.30)
EGFR (African American): 39 — ABNORMAL LOW
EGFR (Non-African Amer.): 34 — ABNORMAL LOW
Glucose: 143 mg/dL — ABNORMAL HIGH (ref 65–99)
Osmolality: 292 (ref 275–301)
Potassium: 5.2 mmol/L — ABNORMAL HIGH (ref 3.5–5.1)
SGOT(AST): 30 U/L (ref 15–37)
SGPT (ALT): 16 U/L
Sodium: 138 mmol/L (ref 136–145)
Total Protein: 6.9 g/dL (ref 6.4–8.2)

## 2012-02-06 LAB — CK TOTAL AND CKMB (NOT AT ARMC)
CK, Total: 59 U/L (ref 21–215)
CK-MB: <0.5 ng/mL — ABNORMAL LOW (ref 0.5–3.6)

## 2012-02-06 LAB — LIPASE, BLOOD: Lipase: 68 U/L — ABNORMAL LOW (ref 73–393)

## 2012-02-06 LAB — TROPONIN I: Troponin-I: <0.02 ng/mL

## 2012-02-06 LAB — MAGNESIUM: Magnesium: 2.6 mg/dL — ABNORMAL HIGH

## 2012-02-07 LAB — CLOSTRIDIUM DIFFICILE BY PCR

## 2012-02-07 LAB — COMPREHENSIVE METABOLIC PANEL
Albumin: 2.7 g/dL — ABNORMAL LOW (ref 3.4–5.0)
Alkaline Phosphatase: 131 U/L (ref 50–136)
Anion Gap: 9 (ref 7–16)
BUN: 55 mg/dL — ABNORMAL HIGH (ref 7–18)
Bilirubin,Total: 0.7 mg/dL (ref 0.2–1.0)
Calcium, Total: 8 mg/dL — ABNORMAL LOW (ref 8.5–10.1)
Chloride: 105 mmol/L (ref 98–107)
Co2: 24 mmol/L (ref 21–32)
Creatinine: 1.59 mg/dL — ABNORMAL HIGH (ref 0.60–1.30)
EGFR (African American): 43 — ABNORMAL LOW
EGFR (Non-African Amer.): 37 — ABNORMAL LOW
Glucose: 123 mg/dL — ABNORMAL HIGH (ref 65–99)
Osmolality: 292 (ref 275–301)
Potassium: 4.3 mmol/L (ref 3.5–5.1)
SGOT(AST): 18 U/L (ref 15–37)
SGPT (ALT): 10 U/L — ABNORMAL LOW
Sodium: 138 mmol/L (ref 136–145)
Total Protein: 5.9 g/dL — ABNORMAL LOW (ref 6.4–8.2)

## 2012-02-07 LAB — CBC WITH DIFFERENTIAL/PLATELET
Basophil #: 0 10*3/uL (ref 0.0–0.1)
Basophil %: 0.2 %
Eosinophil #: 0 10*3/uL (ref 0.0–0.7)
Eosinophil %: 0.1 %
HCT: 38.2 % (ref 35.0–47.0)
HGB: 12.8 g/dL (ref 12.0–16.0)
Lymphocyte #: 1.1 10*3/uL (ref 1.0–3.6)
Lymphocyte %: 8.6 %
MCH: 31.4 pg (ref 26.0–34.0)
MCHC: 33.5 g/dL (ref 32.0–36.0)
MCV: 94 fL (ref 80–100)
Monocyte #: 2.1 x10 3/mm — ABNORMAL HIGH (ref 0.2–0.9)
Monocyte %: 17.1 %
Neutrophil #: 9.2 10*3/uL — ABNORMAL HIGH (ref 1.4–6.5)
Neutrophil %: 74 %
Platelet: 296 10*3/uL (ref 150–440)
RBC: 4.07 10*6/uL (ref 3.80–5.20)
RDW: 15.2 % — ABNORMAL HIGH (ref 11.5–14.5)
WBC: 12.4 10*3/uL — ABNORMAL HIGH (ref 3.6–11.0)

## 2012-02-08 LAB — BASIC METABOLIC PANEL
Anion Gap: 9 (ref 7–16)
BUN: 22 mg/dL — ABNORMAL HIGH (ref 7–18)
Calcium, Total: 7.5 mg/dL — ABNORMAL LOW (ref 8.5–10.1)
Chloride: 108 mmol/L — ABNORMAL HIGH (ref 98–107)
Co2: 24 mmol/L (ref 21–32)
Creatinine: 0.64 mg/dL (ref 0.60–1.30)
EGFR (African American): >60
EGFR (Non-African Amer.): >60
Glucose: 98 mg/dL (ref 65–99)
Osmolality: 285 (ref 275–301)
Potassium: 3.5 mmol/L (ref 3.5–5.1)
Sodium: 141 mmol/L (ref 136–145)

## 2012-02-08 LAB — URINE CULTURE

## 2012-02-09 LAB — PATHOLOGY REPORT

## 2012-02-10 LAB — BASIC METABOLIC PANEL
Anion Gap: 8 (ref 7–16)
BUN: 4 mg/dL — ABNORMAL LOW (ref 7–18)
Calcium, Total: 7.8 mg/dL — ABNORMAL LOW (ref 8.5–10.1)
Chloride: 109 mmol/L — ABNORMAL HIGH (ref 98–107)
Co2: 26 mmol/L (ref 21–32)
Creatinine: 0.5 mg/dL — ABNORMAL LOW (ref 0.60–1.30)
EGFR (African American): >60
EGFR (Non-African Amer.): >60
Glucose: 87 mg/dL (ref 65–99)
Osmolality: 281 (ref 275–301)
Potassium: 3 mmol/L — ABNORMAL LOW (ref 3.5–5.1)
Sodium: 143 mmol/L (ref 136–145)

## 2012-02-10 LAB — CBC WITH DIFFERENTIAL/PLATELET
Basophil #: 0 10*3/uL (ref 0.0–0.1)
Basophil %: 0.6 %
Eosinophil #: 0.1 10*3/uL (ref 0.0–0.7)
Eosinophil %: 1.6 %
HCT: 29.8 % — ABNORMAL LOW (ref 35.0–47.0)
HGB: 10.4 g/dL — ABNORMAL LOW (ref 12.0–16.0)
Lymphocyte #: 2.1 10*3/uL (ref 1.0–3.6)
Lymphocyte %: 26.4 %
MCH: 32.3 pg (ref 26.0–34.0)
MCHC: 34.9 g/dL (ref 32.0–36.0)
MCV: 93 fL (ref 80–100)
Monocyte #: 0.8 x10 3/mm (ref 0.2–0.9)
Monocyte %: 10 %
Neutrophil #: 4.8 10*3/uL (ref 1.4–6.5)
Neutrophil %: 61.4 %
Platelet: 242 10*3/uL (ref 150–440)
RBC: 3.22 10*6/uL — ABNORMAL LOW (ref 3.80–5.20)
RDW: 14.4 % (ref 11.5–14.5)
WBC: 7.8 10*3/uL (ref 3.6–11.0)

## 2012-02-10 LAB — MAGNESIUM: Magnesium: 1.8 mg/dL

## 2012-03-10 ENCOUNTER — Emergency Department: Payer: Self-pay | Admitting: Unknown Physician Specialty

## 2012-03-10 LAB — COMPREHENSIVE METABOLIC PANEL
Albumin: 3.1 g/dL — ABNORMAL LOW (ref 3.4–5.0)
Alkaline Phosphatase: 73 U/L (ref 50–136)
Anion Gap: 5 — ABNORMAL LOW (ref 7–16)
BUN: 16 mg/dL (ref 7–18)
Bilirubin,Total: 0.4 mg/dL (ref 0.2–1.0)
Calcium, Total: 8.6 mg/dL (ref 8.5–10.1)
Chloride: 102 mmol/L (ref 98–107)
Co2: 28 mmol/L (ref 21–32)
Creatinine: 1.13 mg/dL (ref 0.60–1.30)
EGFR (African American): >60
EGFR (Non-African Amer.): 56 — ABNORMAL LOW
Glucose: 91 mg/dL (ref 65–99)
Osmolality: 271 (ref 275–301)
Potassium: 3.7 mmol/L (ref 3.5–5.1)
SGOT(AST): 23 U/L (ref 15–37)
SGPT (ALT): 21 U/L (ref 12–78)
Sodium: 135 mmol/L — ABNORMAL LOW (ref 136–145)
Total Protein: 6.7 g/dL (ref 6.4–8.2)

## 2012-03-10 LAB — CBC
HCT: 33.9 % — ABNORMAL LOW (ref 35.0–47.0)
HGB: 11.6 g/dL — ABNORMAL LOW (ref 12.0–16.0)
MCH: 31.5 pg (ref 26.0–34.0)
MCHC: 34.1 g/dL (ref 32.0–36.0)
MCV: 93 fL (ref 80–100)
Platelet: 315 10*3/uL (ref 150–440)
RBC: 3.67 10*6/uL — ABNORMAL LOW (ref 3.80–5.20)
RDW: 14.6 % — ABNORMAL HIGH (ref 11.5–14.5)
WBC: 9.8 10*3/uL (ref 3.6–11.0)

## 2012-03-10 LAB — URINALYSIS, COMPLETE
Bilirubin,UR: NEGATIVE
Glucose,UR: NEGATIVE mg/dL (ref 0–75)
Hyaline Cast: 17
Ketone: NEGATIVE
Nitrite: NEGATIVE
Ph: 5 (ref 4.5–8.0)
Protein: NEGATIVE
RBC,UR: 29 /HPF (ref 0–5)
Specific Gravity: 1.008 (ref 1.003–1.030)
Squamous Epithelial: 1
WBC UR: 276 /HPF (ref 0–5)

## 2012-03-10 LAB — LIPASE, BLOOD: Lipase: 56 U/L — ABNORMAL LOW (ref 73–393)

## 2012-03-12 LAB — URINE CULTURE

## 2012-03-23 ENCOUNTER — Inpatient Hospital Stay: Payer: Self-pay | Admitting: Internal Medicine

## 2012-03-23 LAB — COMPREHENSIVE METABOLIC PANEL
Albumin: 3.6 g/dL (ref 3.4–5.0)
Alkaline Phosphatase: 316 U/L — ABNORMAL HIGH (ref 50–136)
Anion Gap: 14 (ref 7–16)
BUN: 42 mg/dL — ABNORMAL HIGH (ref 7–18)
Bilirubin,Total: 0.9 mg/dL (ref 0.2–1.0)
Calcium, Total: 9.8 mg/dL (ref 8.5–10.1)
Chloride: 102 mmol/L (ref 98–107)
Co2: 18 mmol/L — ABNORMAL LOW (ref 21–32)
Creatinine: 2.28 mg/dL — ABNORMAL HIGH (ref 0.60–1.30)
EGFR (African American): 28 — ABNORMAL LOW
EGFR (Non-African Amer.): 24 — ABNORMAL LOW
Glucose: 161 mg/dL — ABNORMAL HIGH (ref 65–99)
Osmolality: 282 (ref 275–301)
Potassium: 5.1 mmol/L (ref 3.5–5.1)
SGOT(AST): 38 U/L — ABNORMAL HIGH (ref 15–37)
SGPT (ALT): 27 U/L (ref 12–78)
Sodium: 134 mmol/L — ABNORMAL LOW (ref 136–145)
Total Protein: 7.8 g/dL (ref 6.4–8.2)

## 2012-03-23 LAB — URINALYSIS, COMPLETE
Glucose,UR: NEGATIVE mg/dL (ref 0–75)
Hyaline Cast: 45
Nitrite: NEGATIVE
Ph: 5 (ref 4.5–8.0)
Protein: 30
RBC,UR: 2 /HPF (ref 0–5)
Specific Gravity: 1.024 (ref 1.003–1.030)
Squamous Epithelial: 2
WBC UR: 2 /HPF (ref 0–5)

## 2012-03-23 LAB — MAGNESIUM: Magnesium: 3.1 mg/dL — ABNORMAL HIGH

## 2012-03-23 LAB — CBC
HCT: 51.1 % — ABNORMAL HIGH (ref 35.0–47.0)
HGB: 16.3 g/dL — ABNORMAL HIGH (ref 12.0–16.0)
MCH: 29.7 pg (ref 26.0–34.0)
MCHC: 32 g/dL (ref 32.0–36.0)
MCV: 93 fL (ref 80–100)
Platelet: 672 10*3/uL — ABNORMAL HIGH (ref 150–440)
RBC: 5.49 10*6/uL — ABNORMAL HIGH (ref 3.80–5.20)
RDW: 14.7 % — ABNORMAL HIGH (ref 11.5–14.5)
WBC: 16 10*3/uL — ABNORMAL HIGH (ref 3.6–11.0)

## 2012-03-23 LAB — LIPASE, BLOOD: Lipase: 71 U/L — ABNORMAL LOW (ref 73–393)

## 2012-03-23 LAB — CK TOTAL AND CKMB (NOT AT ARMC)
CK, Total: 46 U/L (ref 21–215)
CK-MB: <0.5 ng/mL — ABNORMAL LOW (ref 0.5–3.6)

## 2012-03-23 LAB — TROPONIN I: Troponin-I: <0.02 ng/mL

## 2012-03-23 LAB — APTT: Activated PTT: 28.6 secs (ref 23.6–35.9)

## 2012-03-24 LAB — COMPREHENSIVE METABOLIC PANEL
Albumin: 2.5 g/dL — ABNORMAL LOW (ref 3.4–5.0)
Alkaline Phosphatase: 247 U/L — ABNORMAL HIGH (ref 50–136)
Anion Gap: 10 (ref 7–16)
BUN: 53 mg/dL — ABNORMAL HIGH (ref 7–18)
Bilirubin,Total: 0.5 mg/dL (ref 0.2–1.0)
Calcium, Total: 8.2 mg/dL — ABNORMAL LOW (ref 8.5–10.1)
Chloride: 113 mmol/L — ABNORMAL HIGH (ref 98–107)
Co2: 18 mmol/L — ABNORMAL LOW (ref 21–32)
Creatinine: 1.44 mg/dL — ABNORMAL HIGH (ref 0.60–1.30)
EGFR (African American): 48 — ABNORMAL LOW
EGFR (Non-African Amer.): 42 — ABNORMAL LOW
Glucose: 124 mg/dL — ABNORMAL HIGH (ref 65–99)
Osmolality: 297 (ref 275–301)
Potassium: 3.9 mmol/L (ref 3.5–5.1)
SGOT(AST): 27 U/L (ref 15–37)
SGPT (ALT): 18 U/L (ref 12–78)
Sodium: 141 mmol/L (ref 136–145)
Total Protein: 6 g/dL — ABNORMAL LOW (ref 6.4–8.2)

## 2012-03-24 LAB — CBC WITH DIFFERENTIAL/PLATELET
Bands: 1 %
Comment - H1-Com2: NORMAL
HCT: 42.9 % (ref 35.0–47.0)
HGB: 14.2 g/dL (ref 12.0–16.0)
Lymphocytes: 21 %
MCH: 30.6 pg (ref 26.0–34.0)
MCHC: 33 g/dL (ref 32.0–36.0)
MCV: 93 fL (ref 80–100)
Monocytes: 14 %
Platelet: 529 10*3/uL — ABNORMAL HIGH (ref 150–440)
RBC: 4.63 10*6/uL (ref 3.80–5.20)
RDW: 14.9 % — ABNORMAL HIGH (ref 11.5–14.5)
Segmented Neutrophils: 64 %
WBC: 14.6 10*3/uL — ABNORMAL HIGH (ref 3.6–11.0)

## 2012-03-24 LAB — MAGNESIUM: Magnesium: 2.5 mg/dL — ABNORMAL HIGH

## 2012-03-25 LAB — COMPREHENSIVE METABOLIC PANEL
Albumin: 2.2 g/dL — ABNORMAL LOW (ref 3.4–5.0)
Alkaline Phosphatase: 163 U/L — ABNORMAL HIGH (ref 50–136)
Anion Gap: 10 (ref 7–16)
BUN: 41 mg/dL — ABNORMAL HIGH (ref 7–18)
Bilirubin,Total: 0.6 mg/dL (ref 0.2–1.0)
Calcium, Total: 7.6 mg/dL — ABNORMAL LOW (ref 8.5–10.1)
Chloride: 117 mmol/L — ABNORMAL HIGH (ref 98–107)
Co2: 19 mmol/L — ABNORMAL LOW (ref 21–32)
Creatinine: 0.72 mg/dL (ref 0.60–1.30)
EGFR (African American): >60
EGFR (Non-African Amer.): >60
Glucose: 109 mg/dL — ABNORMAL HIGH (ref 65–99)
Osmolality: 301 (ref 275–301)
Potassium: 3.6 mmol/L (ref 3.5–5.1)
SGOT(AST): 15 U/L (ref 15–37)
SGPT (ALT): 13 U/L (ref 12–78)
Sodium: 146 mmol/L — ABNORMAL HIGH (ref 136–145)
Total Protein: 5.1 g/dL — ABNORMAL LOW (ref 6.4–8.2)

## 2012-03-25 LAB — CBC WITH DIFFERENTIAL/PLATELET
Basophil #: 0 10*3/uL (ref 0.0–0.1)
Basophil %: 0.2 %
Eosinophil #: 0 10*3/uL (ref 0.0–0.7)
Eosinophil %: 0.2 %
HCT: 36.1 % (ref 35.0–47.0)
HGB: 12.1 g/dL (ref 12.0–16.0)
Lymphocyte #: 1.9 10*3/uL (ref 1.0–3.6)
Lymphocyte %: 16 %
MCH: 31.3 pg (ref 26.0–34.0)
MCHC: 33.4 g/dL (ref 32.0–36.0)
MCV: 94 fL (ref 80–100)
Monocyte #: 1.5 x10 3/mm — ABNORMAL HIGH (ref 0.2–0.9)
Monocyte %: 12.5 %
Neutrophil #: 8.6 10*3/uL — ABNORMAL HIGH (ref 1.4–6.5)
Neutrophil %: 71.1 %
Platelet: 423 10*3/uL (ref 150–440)
RBC: 3.86 10*6/uL (ref 3.80–5.20)
RDW: 15.2 % — ABNORMAL HIGH (ref 11.5–14.5)
WBC: 12.1 10*3/uL — ABNORMAL HIGH (ref 3.6–11.0)

## 2012-03-25 LAB — CLOSTRIDIUM DIFFICILE BY PCR

## 2012-03-26 LAB — CBC WITH DIFFERENTIAL/PLATELET
Basophil #: 0.1 10*3/uL (ref 0.0–0.1)
Basophil %: 0.5 %
Eosinophil #: 0.1 10*3/uL (ref 0.0–0.7)
Eosinophil %: 0.8 %
HCT: 30.5 % — ABNORMAL LOW (ref 35.0–47.0)
HGB: 10.3 g/dL — ABNORMAL LOW (ref 12.0–16.0)
Lymphocyte #: 2.1 10*3/uL (ref 1.0–3.6)
Lymphocyte %: 20.1 %
MCH: 31.2 pg (ref 26.0–34.0)
MCHC: 33.7 g/dL (ref 32.0–36.0)
MCV: 93 fL (ref 80–100)
Monocyte #: 0.9 x10 3/mm (ref 0.2–0.9)
Monocyte %: 8.9 %
Neutrophil #: 7.4 10*3/uL — ABNORMAL HIGH (ref 1.4–6.5)
Neutrophil %: 69.7 %
Platelet: 384 10*3/uL (ref 150–440)
RBC: 3.29 10*6/uL — ABNORMAL LOW (ref 3.80–5.20)
RDW: 14.8 % — ABNORMAL HIGH (ref 11.5–14.5)
WBC: 10.7 10*3/uL (ref 3.6–11.0)

## 2012-03-26 LAB — BASIC METABOLIC PANEL
Anion Gap: 10 (ref 7–16)
BUN: 20 mg/dL — ABNORMAL HIGH (ref 7–18)
Calcium, Total: 7.4 mg/dL — ABNORMAL LOW (ref 8.5–10.1)
Chloride: 115 mmol/L — ABNORMAL HIGH (ref 98–107)
Co2: 20 mmol/L — ABNORMAL LOW (ref 21–32)
Creatinine: 0.58 mg/dL — ABNORMAL LOW (ref 0.60–1.30)
EGFR (African American): >60
EGFR (Non-African Amer.): >60
Glucose: 75 mg/dL (ref 65–99)
Osmolality: 290 (ref 275–301)
Potassium: 3.1 mmol/L — ABNORMAL LOW (ref 3.5–5.1)
Sodium: 145 mmol/L (ref 136–145)

## 2012-03-27 LAB — CBC WITH DIFFERENTIAL/PLATELET
Basophil #: 0.1 10*3/uL (ref 0.0–0.1)
Basophil %: 0.7 %
Eosinophil #: 0.1 10*3/uL (ref 0.0–0.7)
Eosinophil %: 1.8 %
HCT: 28.4 % — ABNORMAL LOW (ref 35.0–47.0)
HGB: 9.7 g/dL — ABNORMAL LOW (ref 12.0–16.0)
Lymphocyte #: 2.4 10*3/uL (ref 1.0–3.6)
Lymphocyte %: 29.9 %
MCH: 31.3 pg (ref 26.0–34.0)
MCHC: 34 g/dL (ref 32.0–36.0)
MCV: 92 fL (ref 80–100)
Monocyte #: 0.9 x10 3/mm (ref 0.2–0.9)
Monocyte %: 11.7 %
Neutrophil #: 4.5 10*3/uL (ref 1.4–6.5)
Neutrophil %: 55.9 %
Platelet: 333 10*3/uL (ref 150–440)
RBC: 3.08 10*6/uL — ABNORMAL LOW (ref 3.80–5.20)
RDW: 14.8 % — ABNORMAL HIGH (ref 11.5–14.5)
WBC: 8.1 10*3/uL (ref 3.6–11.0)

## 2012-03-27 LAB — BASIC METABOLIC PANEL
Anion Gap: 9 (ref 7–16)
BUN: 7 mg/dL (ref 7–18)
Calcium, Total: 7 mg/dL — CL (ref 8.5–10.1)
Chloride: 113 mmol/L — ABNORMAL HIGH (ref 98–107)
Co2: 22 mmol/L (ref 21–32)
Creatinine: 0.55 mg/dL — ABNORMAL LOW (ref 0.60–1.30)
EGFR (African American): >60
EGFR (Non-African Amer.): >60
Glucose: 87 mg/dL (ref 65–99)
Osmolality: 284 (ref 275–301)
Potassium: 3 mmol/L — ABNORMAL LOW (ref 3.5–5.1)
Sodium: 144 mmol/L (ref 136–145)

## 2012-03-28 LAB — COMPREHENSIVE METABOLIC PANEL
Albumin: 2.2 g/dL — ABNORMAL LOW (ref 3.4–5.0)
Alkaline Phosphatase: 75 U/L (ref 50–136)
Anion Gap: 11 (ref 7–16)
BUN: 2 mg/dL — ABNORMAL LOW (ref 7–18)
Bilirubin,Total: 0.5 mg/dL (ref 0.2–1.0)
Calcium, Total: 7.6 mg/dL — ABNORMAL LOW (ref 8.5–10.1)
Chloride: 110 mmol/L — ABNORMAL HIGH (ref 98–107)
Co2: 21 mmol/L (ref 21–32)
Creatinine: 0.45 mg/dL — ABNORMAL LOW (ref 0.60–1.30)
EGFR (African American): >60
EGFR (Non-African Amer.): >60
Glucose: 100 mg/dL — ABNORMAL HIGH (ref 65–99)
Osmolality: 279 (ref 275–301)
Potassium: 3.5 mmol/L (ref 3.5–5.1)
SGOT(AST): 11 U/L — ABNORMAL LOW (ref 15–37)
SGPT (ALT): 9 U/L — ABNORMAL LOW (ref 12–78)
Sodium: 142 mmol/L (ref 136–145)
Total Protein: 4.5 g/dL — ABNORMAL LOW (ref 6.4–8.2)

## 2012-03-28 LAB — CBC WITH DIFFERENTIAL/PLATELET
Basophil #: 0.1 10*3/uL (ref 0.0–0.1)
Basophil %: 0.7 %
Eosinophil #: 0.3 10*3/uL (ref 0.0–0.7)
Eosinophil %: 3.3 %
HCT: 30.2 % — ABNORMAL LOW (ref 35.0–47.0)
HGB: 10.1 g/dL — ABNORMAL LOW (ref 12.0–16.0)
Lymphocyte #: 2.3 10*3/uL (ref 1.0–3.6)
Lymphocyte %: 28.6 %
MCH: 30.8 pg (ref 26.0–34.0)
MCHC: 33.3 g/dL (ref 32.0–36.0)
MCV: 92 fL (ref 80–100)
Monocyte #: 1.3 x10 3/mm — ABNORMAL HIGH (ref 0.2–0.9)
Monocyte %: 16 %
Neutrophil #: 4 10*3/uL (ref 1.4–6.5)
Neutrophil %: 51.4 %
Platelet: 282 10*3/uL (ref 150–440)
RBC: 3.27 10*6/uL — ABNORMAL LOW (ref 3.80–5.20)
RDW: 14.8 % — ABNORMAL HIGH (ref 11.5–14.5)
WBC: 7.9 10*3/uL (ref 3.6–11.0)

## 2012-03-29 LAB — BASIC METABOLIC PANEL
Anion Gap: 9 (ref 7–16)
BUN: 2 mg/dL — ABNORMAL LOW (ref 7–18)
Calcium, Total: 7.5 mg/dL — ABNORMAL LOW (ref 8.5–10.1)
Chloride: 109 mmol/L — ABNORMAL HIGH (ref 98–107)
Co2: 24 mmol/L (ref 21–32)
Creatinine: 0.46 mg/dL — ABNORMAL LOW (ref 0.60–1.30)
EGFR (African American): >60
EGFR (Non-African Amer.): >60
Glucose: 96 mg/dL (ref 65–99)
Osmolality: 279 (ref 275–301)
Potassium: 3.3 mmol/L — ABNORMAL LOW (ref 3.5–5.1)
Sodium: 142 mmol/L (ref 136–145)

## 2012-03-29 LAB — CBC WITH DIFFERENTIAL/PLATELET
Basophil #: 0 10*3/uL (ref 0.0–0.1)
Basophil %: 0.5 %
Eosinophil #: 0.2 10*3/uL (ref 0.0–0.7)
Eosinophil %: 2.9 %
HCT: 29.3 % — ABNORMAL LOW (ref 35.0–47.0)
HGB: 10 g/dL — ABNORMAL LOW (ref 12.0–16.0)
Lymphocyte #: 2.6 10*3/uL (ref 1.0–3.6)
Lymphocyte %: 33.8 %
MCH: 31.3 pg (ref 26.0–34.0)
MCHC: 34.3 g/dL (ref 32.0–36.0)
MCV: 91 fL (ref 80–100)
Monocyte #: 1.2 x10 3/mm — ABNORMAL HIGH (ref 0.2–0.9)
Monocyte %: 15.4 %
Neutrophil #: 3.7 10*3/uL (ref 1.4–6.5)
Neutrophil %: 47.4 %
Platelet: 278 10*3/uL (ref 150–440)
RBC: 3.2 10*6/uL — ABNORMAL LOW (ref 3.80–5.20)
RDW: 14.7 % — ABNORMAL HIGH (ref 11.5–14.5)
WBC: 7.7 10*3/uL (ref 3.6–11.0)

## 2012-03-29 LAB — CULTURE, BLOOD (SINGLE)

## 2012-03-29 LAB — PATHOLOGY REPORT

## 2012-03-30 LAB — POTASSIUM: Potassium: 4 mmol/L (ref 3.5–5.1)

## 2012-04-22 ENCOUNTER — Ambulatory Visit: Payer: Self-pay | Admitting: Gastroenterology

## 2012-05-12 ENCOUNTER — Ambulatory Visit: Payer: Self-pay | Admitting: Gastroenterology

## 2012-08-17 ENCOUNTER — Ambulatory Visit: Payer: Self-pay | Admitting: Gastroenterology

## 2012-08-18 ENCOUNTER — Ambulatory Visit: Payer: Self-pay | Admitting: Gastroenterology

## 2012-08-22 LAB — PATHOLOGY REPORT

## 2012-09-15 ENCOUNTER — Emergency Department: Payer: Self-pay | Admitting: Emergency Medicine

## 2012-10-20 ENCOUNTER — Emergency Department: Payer: Self-pay | Admitting: Unknown Physician Specialty

## 2012-10-20 LAB — COMPREHENSIVE METABOLIC PANEL
Albumin: 4.4 g/dL (ref 3.4–5.0)
Alkaline Phosphatase: 89 U/L (ref 50–136)
Anion Gap: 6 — ABNORMAL LOW (ref 7–16)
BUN: 19 mg/dL — ABNORMAL HIGH (ref 7–18)
Bilirubin,Total: 0.5 mg/dL (ref 0.2–1.0)
Calcium, Total: 9.6 mg/dL (ref 8.5–10.1)
Chloride: 98 mmol/L (ref 98–107)
Co2: 27 mmol/L (ref 21–32)
Creatinine: 0.9 mg/dL (ref 0.60–1.30)
EGFR (African American): >60
EGFR (Non-African Amer.): >60
Glucose: 111 mg/dL — ABNORMAL HIGH (ref 65–99)
Osmolality: 266 (ref 275–301)
Potassium: 3.7 mmol/L (ref 3.5–5.1)
SGOT(AST): 25 U/L (ref 15–37)
SGPT (ALT): 16 U/L (ref 12–78)
Sodium: 131 mmol/L — ABNORMAL LOW (ref 136–145)
Total Protein: 9.3 g/dL — ABNORMAL HIGH (ref 6.4–8.2)

## 2012-10-20 LAB — URINALYSIS, COMPLETE
Bacteria: NONE SEEN
Bilirubin,UR: NEGATIVE
Blood: NEGATIVE
Glucose,UR: NEGATIVE mg/dL (ref 0–75)
Hyaline Cast: 6
Ketone: NEGATIVE
Leukocyte Esterase: NEGATIVE
Nitrite: NEGATIVE
Ph: 5 (ref 4.5–8.0)
Protein: NEGATIVE
RBC,UR: 1 /HPF (ref 0–5)
Specific Gravity: 1.011 (ref 1.003–1.030)
Squamous Epithelial: 2
WBC UR: 1 /HPF (ref 0–5)

## 2012-10-20 LAB — CBC
HCT: 48.5 % — ABNORMAL HIGH (ref 35.0–47.0)
HGB: 16.3 g/dL — ABNORMAL HIGH (ref 12.0–16.0)
MCH: 31.6 pg (ref 26.0–34.0)
MCHC: 33.6 g/dL (ref 32.0–36.0)
MCV: 94 fL (ref 80–100)
Platelet: 411 10*3/uL (ref 150–440)
RBC: 5.16 10*6/uL (ref 3.80–5.20)
RDW: 14.7 % — ABNORMAL HIGH (ref 11.5–14.5)
WBC: 6.5 10*3/uL (ref 3.6–11.0)

## 2012-10-20 LAB — LIPASE, BLOOD: Lipase: 109 U/L (ref 73–393)

## 2012-12-01 ENCOUNTER — Observation Stay: Payer: Self-pay | Admitting: Internal Medicine

## 2012-12-01 LAB — URINALYSIS, COMPLETE
Bilirubin,UR: NEGATIVE
Blood: NEGATIVE
Glucose,UR: NEGATIVE mg/dL (ref 0–75)
Ketone: NEGATIVE
Nitrite: NEGATIVE
Ph: 5 (ref 4.5–8.0)
Protein: NEGATIVE
RBC,UR: 233 /HPF (ref 0–5)
Specific Gravity: 1.023 (ref 1.003–1.030)
Squamous Epithelial: 1
WBC UR: 28 /HPF (ref 0–5)

## 2012-12-01 LAB — CBC WITH DIFFERENTIAL/PLATELET
Basophil #: 0.1 10*3/uL (ref 0.0–0.1)
Basophil %: 0.8 %
Eosinophil #: 0.2 10*3/uL (ref 0.0–0.7)
Eosinophil %: 2.8 %
HCT: 34.8 % — ABNORMAL LOW (ref 35.0–47.0)
HGB: 11.9 g/dL — ABNORMAL LOW (ref 12.0–16.0)
Lymphocyte #: 2.5 10*3/uL (ref 1.0–3.6)
Lymphocyte %: 32.1 %
MCH: 31.4 pg (ref 26.0–34.0)
MCHC: 34.1 g/dL (ref 32.0–36.0)
MCV: 92 fL (ref 80–100)
Monocyte #: 0.5 x10 3/mm (ref 0.2–0.9)
Monocyte %: 5.9 %
Neutrophil #: 4.6 10*3/uL (ref 1.4–6.5)
Neutrophil %: 58.4 %
Platelet: 324 10*3/uL (ref 150–440)
RBC: 3.78 10*6/uL — ABNORMAL LOW (ref 3.80–5.20)
RDW: 14.8 % — ABNORMAL HIGH (ref 11.5–14.5)
WBC: 7.9 10*3/uL (ref 3.6–11.0)

## 2012-12-01 LAB — COMPREHENSIVE METABOLIC PANEL
Albumin: 3.3 g/dL — ABNORMAL LOW (ref 3.4–5.0)
Alkaline Phosphatase: 69 U/L (ref 50–136)
Anion Gap: 2 — ABNORMAL LOW (ref 7–16)
BUN: 12 mg/dL (ref 7–18)
Bilirubin,Total: 0.4 mg/dL (ref 0.2–1.0)
Calcium, Total: 8.9 mg/dL (ref 8.5–10.1)
Chloride: 106 mmol/L (ref 98–107)
Co2: 28 mmol/L (ref 21–32)
Creatinine: 0.53 mg/dL — ABNORMAL LOW (ref 0.60–1.30)
EGFR (African American): >60
EGFR (Non-African Amer.): >60
Glucose: 75 mg/dL (ref 65–99)
Osmolality: 270 (ref 275–301)
Potassium: 4.8 mmol/L (ref 3.5–5.1)
SGOT(AST): 44 U/L — ABNORMAL HIGH (ref 15–37)
SGPT (ALT): 28 U/L (ref 12–78)
Sodium: 136 mmol/L (ref 136–145)
Total Protein: 7.2 g/dL (ref 6.4–8.2)

## 2012-12-01 LAB — LIPASE, BLOOD: Lipase: 37 U/L — ABNORMAL LOW (ref 73–393)

## 2012-12-23 ENCOUNTER — Emergency Department: Payer: Self-pay | Admitting: Emergency Medicine

## 2013-01-03 LAB — COMPREHENSIVE METABOLIC PANEL
Albumin: 3.4 g/dL (ref 3.4–5.0)
Alkaline Phosphatase: 65 U/L (ref 50–136)
Anion Gap: 9 (ref 7–16)
BUN: 18 mg/dL (ref 7–18)
Bilirubin,Total: 1 mg/dL (ref 0.2–1.0)
Calcium, Total: 9.1 mg/dL (ref 8.5–10.1)
Chloride: 105 mmol/L (ref 98–107)
Co2: 24 mmol/L (ref 21–32)
Creatinine: 1.49 mg/dL — ABNORMAL HIGH (ref 0.60–1.30)
EGFR (African American): 46 — ABNORMAL LOW
EGFR (Non-African Amer.): 40 — ABNORMAL LOW
Glucose: 143 mg/dL — ABNORMAL HIGH (ref 65–99)
Osmolality: 280 (ref 275–301)
Potassium: 3.3 mmol/L — ABNORMAL LOW (ref 3.5–5.1)
SGOT(AST): 24 U/L (ref 15–37)
SGPT (ALT): 16 U/L (ref 12–78)
Sodium: 138 mmol/L (ref 136–145)
Total Protein: 6.6 g/dL (ref 6.4–8.2)

## 2013-01-03 LAB — CBC WITH DIFFERENTIAL/PLATELET
Basophil #: 0.1 10*3/uL (ref 0.0–0.1)
Basophil %: 0.6 %
Eosinophil #: 0.1 10*3/uL (ref 0.0–0.7)
Eosinophil %: 0.8 %
HCT: 43.2 % (ref 35.0–47.0)
HGB: 14.6 g/dL (ref 12.0–16.0)
Lymphocyte #: 2.2 10*3/uL (ref 1.0–3.6)
Lymphocyte %: 18.9 %
MCH: 30.8 pg (ref 26.0–34.0)
MCHC: 33.7 g/dL (ref 32.0–36.0)
MCV: 91 fL (ref 80–100)
Monocyte #: 0.1 x10 3/mm — ABNORMAL LOW (ref 0.2–0.9)
Monocyte %: 0.9 %
Neutrophil #: 9 10*3/uL — ABNORMAL HIGH (ref 1.4–6.5)
Neutrophil %: 78.8 %
Platelet: 385 10*3/uL (ref 150–440)
RBC: 4.74 10*6/uL (ref 3.80–5.20)
RDW: 15.1 % — ABNORMAL HIGH (ref 11.5–14.5)
WBC: 11.5 10*3/uL — ABNORMAL HIGH (ref 3.6–11.0)

## 2013-01-03 LAB — PROTIME-INR
INR: 1
Prothrombin Time: 13.5 secs (ref 11.5–14.7)

## 2013-01-03 LAB — LIPASE, BLOOD: Lipase: 160 U/L (ref 73–393)

## 2013-01-03 LAB — TROPONIN I: Troponin-I: <0.02 ng/mL

## 2013-01-03 LAB — MAGNESIUM: Magnesium: 2.6 mg/dL — ABNORMAL HIGH

## 2013-01-03 LAB — APTT: Activated PTT: 30.2 secs (ref 23.6–35.9)

## 2013-01-04 ENCOUNTER — Inpatient Hospital Stay: Payer: Self-pay | Admitting: Internal Medicine

## 2013-01-04 LAB — URINALYSIS, COMPLETE
Bacteria: NONE SEEN
Bilirubin,UR: NEGATIVE
Blood: NEGATIVE
Glucose,UR: NEGATIVE mg/dL (ref 0–75)
Hyaline Cast: 3
Leukocyte Esterase: NEGATIVE
Nitrite: NEGATIVE
Ph: 5 (ref 4.5–8.0)
Protein: 30
RBC,UR: 1 /HPF (ref 0–5)
Specific Gravity: 1.014 (ref 1.003–1.030)
Squamous Epithelial: 1
WBC UR: <1 /HPF (ref 0–5)

## 2013-01-04 LAB — DRUG SCREEN, URINE
Amphetamines, Ur Screen: NEGATIVE (ref ?–1000)
Barbiturates, Ur Screen: NEGATIVE (ref ?–200)
Benzodiazepine, Ur Scrn: POSITIVE (ref ?–200)
Cannabinoid 50 Ng, Ur ~~LOC~~: POSITIVE (ref ?–50)
Cocaine Metabolite,Ur ~~LOC~~: NEGATIVE (ref ?–300)
MDMA (Ecstasy)Ur Screen: NEGATIVE (ref ?–500)
Methadone, Ur Screen: POSITIVE (ref ?–300)
Opiate, Ur Screen: NEGATIVE (ref ?–300)
Phencyclidine (PCP) Ur S: NEGATIVE (ref ?–25)
Tricyclic, Ur Screen: NEGATIVE (ref ?–1000)

## 2013-01-04 LAB — CLOSTRIDIUM DIFFICILE BY PCR

## 2013-01-05 LAB — CBC WITH DIFFERENTIAL/PLATELET
Basophil #: 0 10*3/uL (ref 0.0–0.1)
Basophil %: 0.3 %
Eosinophil #: 0.1 10*3/uL (ref 0.0–0.7)
Eosinophil %: 0.6 %
HCT: 30.6 % — ABNORMAL LOW (ref 35.0–47.0)
HGB: 10.7 g/dL — ABNORMAL LOW (ref 12.0–16.0)
Lymphocyte #: 1.7 10*3/uL (ref 1.0–3.6)
Lymphocyte %: 16.1 %
MCH: 31.3 pg (ref 26.0–34.0)
MCHC: 34.9 g/dL (ref 32.0–36.0)
MCV: 90 fL (ref 80–100)
Monocyte #: 0.8 x10 3/mm (ref 0.2–0.9)
Monocyte %: 7.4 %
Neutrophil #: 8.2 10*3/uL — ABNORMAL HIGH (ref 1.4–6.5)
Neutrophil %: 75.6 %
Platelet: 252 10*3/uL (ref 150–440)
RBC: 3.42 10*6/uL — ABNORMAL LOW (ref 3.80–5.20)
RDW: 15.1 % — ABNORMAL HIGH (ref 11.5–14.5)
WBC: 10.8 10*3/uL (ref 3.6–11.0)

## 2013-01-05 LAB — COMPREHENSIVE METABOLIC PANEL
Albumin: 2.4 g/dL — ABNORMAL LOW (ref 3.4–5.0)
Alkaline Phosphatase: 47 U/L — ABNORMAL LOW (ref 50–136)
Anion Gap: 5 — ABNORMAL LOW (ref 7–16)
BUN: 5 mg/dL — ABNORMAL LOW (ref 7–18)
Bilirubin,Total: 0.4 mg/dL (ref 0.2–1.0)
Calcium, Total: 7.9 mg/dL — ABNORMAL LOW (ref 8.5–10.1)
Chloride: 110 mmol/L — ABNORMAL HIGH (ref 98–107)
Co2: 27 mmol/L (ref 21–32)
Creatinine: 0.63 mg/dL (ref 0.60–1.30)
EGFR (African American): >60
EGFR (Non-African Amer.): >60
Glucose: 127 mg/dL — ABNORMAL HIGH (ref 65–99)
Osmolality: 282 (ref 275–301)
Potassium: 3.2 mmol/L — ABNORMAL LOW (ref 3.5–5.1)
SGOT(AST): 16 U/L (ref 15–37)
SGPT (ALT): 10 U/L — ABNORMAL LOW (ref 12–78)
Sodium: 142 mmol/L (ref 136–145)
Total Protein: 5.3 g/dL — ABNORMAL LOW (ref 6.4–8.2)

## 2013-01-05 LAB — MAGNESIUM: Magnesium: 1.3 mg/dL — ABNORMAL LOW

## 2013-01-06 LAB — BASIC METABOLIC PANEL
Anion Gap: 4 — ABNORMAL LOW (ref 7–16)
BUN: 4 mg/dL — ABNORMAL LOW (ref 7–18)
Calcium, Total: 7.8 mg/dL — ABNORMAL LOW (ref 8.5–10.1)
Chloride: 109 mmol/L — ABNORMAL HIGH (ref 98–107)
Co2: 27 mmol/L (ref 21–32)
Creatinine: 0.54 mg/dL — ABNORMAL LOW (ref 0.60–1.30)
EGFR (African American): >60
EGFR (Non-African Amer.): >60
Glucose: 81 mg/dL (ref 65–99)
Osmolality: 275 (ref 275–301)
Potassium: 3.4 mmol/L — ABNORMAL LOW (ref 3.5–5.1)
Sodium: 140 mmol/L (ref 136–145)

## 2013-01-06 LAB — CBC WITH DIFFERENTIAL/PLATELET
Basophil #: 0.1 10*3/uL (ref 0.0–0.1)
Basophil %: 0.6 %
Eosinophil #: 0.2 10*3/uL (ref 0.0–0.7)
Eosinophil %: 2.2 %
HCT: 26.7 % — ABNORMAL LOW (ref 35.0–47.0)
HGB: 9.2 g/dL — ABNORMAL LOW (ref 12.0–16.0)
Lymphocyte #: 2.7 10*3/uL (ref 1.0–3.6)
Lymphocyte %: 31.2 %
MCH: 30.9 pg (ref 26.0–34.0)
MCHC: 34.5 g/dL (ref 32.0–36.0)
MCV: 90 fL (ref 80–100)
Monocyte #: 0.7 x10 3/mm (ref 0.2–0.9)
Monocyte %: 8.2 %
Neutrophil #: 5 10*3/uL (ref 1.4–6.5)
Neutrophil %: 57.8 %
Platelet: 207 10*3/uL (ref 150–440)
RBC: 2.99 10*6/uL — ABNORMAL LOW (ref 3.80–5.20)
RDW: 15.1 % — ABNORMAL HIGH (ref 11.5–14.5)
WBC: 8.6 10*3/uL (ref 3.6–11.0)

## 2013-01-06 LAB — MAGNESIUM: Magnesium: 1.6 mg/dL — ABNORMAL LOW

## 2013-01-06 LAB — URINE CULTURE

## 2013-01-07 LAB — CBC WITH DIFFERENTIAL/PLATELET
Basophil #: 0.1 10*3/uL (ref 0.0–0.1)
Basophil %: 1 %
Eosinophil #: 0.2 10*3/uL (ref 0.0–0.7)
Eosinophil %: 2.6 %
HCT: 28.5 % — ABNORMAL LOW (ref 35.0–47.0)
HGB: 10 g/dL — ABNORMAL LOW (ref 12.0–16.0)
Lymphocyte #: 1.7 10*3/uL (ref 1.0–3.6)
Lymphocyte %: 22.8 %
MCH: 31.3 pg (ref 26.0–34.0)
MCHC: 35 g/dL (ref 32.0–36.0)
MCV: 90 fL (ref 80–100)
Monocyte #: 0.6 x10 3/mm (ref 0.2–0.9)
Monocyte %: 7.5 %
Neutrophil #: 4.9 10*3/uL (ref 1.4–6.5)
Neutrophil %: 66.1 %
Platelet: 233 10*3/uL (ref 150–440)
RBC: 3.18 10*6/uL — ABNORMAL LOW (ref 3.80–5.20)
RDW: 15.3 % — ABNORMAL HIGH (ref 11.5–14.5)
WBC: 7.4 10*3/uL (ref 3.6–11.0)

## 2013-01-07 LAB — POTASSIUM: Potassium: 3.7 mmol/L (ref 3.5–5.1)

## 2013-01-07 LAB — MAGNESIUM: Magnesium: 1.6 mg/dL — ABNORMAL LOW

## 2013-01-09 LAB — CULTURE, BLOOD (SINGLE)

## 2013-02-02 ENCOUNTER — Emergency Department: Payer: Self-pay | Admitting: Emergency Medicine

## 2013-03-22 ENCOUNTER — Emergency Department: Payer: Self-pay | Admitting: Emergency Medicine

## 2013-04-07 DIAGNOSIS — F17200 Nicotine dependence, unspecified, uncomplicated: Secondary | ICD-10-CM | POA: Insufficient documentation

## 2013-04-07 DIAGNOSIS — I252 Old myocardial infarction: Secondary | ICD-10-CM | POA: Insufficient documentation

## 2013-04-07 DIAGNOSIS — G894 Chronic pain syndrome: Secondary | ICD-10-CM | POA: Insufficient documentation

## 2013-04-07 DIAGNOSIS — K1120 Sialoadenitis, unspecified: Secondary | ICD-10-CM | POA: Insufficient documentation

## 2013-04-07 DIAGNOSIS — M792 Neuralgia and neuritis, unspecified: Secondary | ICD-10-CM | POA: Insufficient documentation

## 2013-04-07 DIAGNOSIS — I519 Heart disease, unspecified: Secondary | ICD-10-CM | POA: Insufficient documentation

## 2013-04-07 DIAGNOSIS — F411 Generalized anxiety disorder: Secondary | ICD-10-CM | POA: Insufficient documentation

## 2013-06-20 IMAGING — CT CT ABD-PELV W/O CM
1 of 2 series · 15 of 32 positions shown, 19 images · non-contrast
Comparison: none

REASON FOR EXAM: (1) upper gi bleed with elevated bun/ct and severe abd
pain. prior partial colec
COMMENTS:

PROCEDURE:     CT  - CT ABDOMEN AND PELVIS W[DATE]  [DATE]
RESULT:     Comparison: None
TECHNIQUE: Multiple axial images from the lung bases to the symphysis pubis
were obtained without oral and without intravenous contrast.

[Series 2: 3mm soft tissue · axial · 0.75mm/px · z∈[-1124,-704]mm · 15 of 154 slices shown, 19 images]
[im 7/154  soft-tissue]
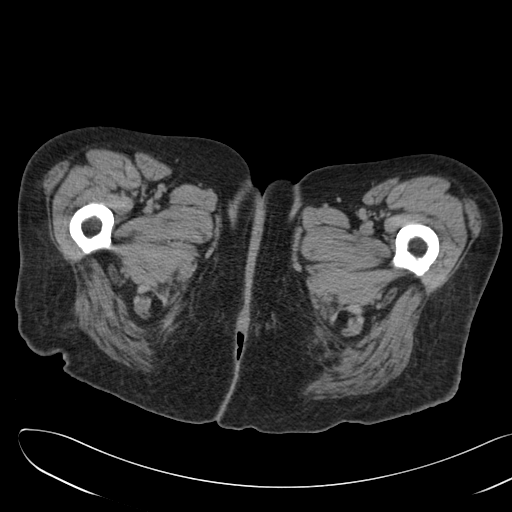
[im 7/154  bone]
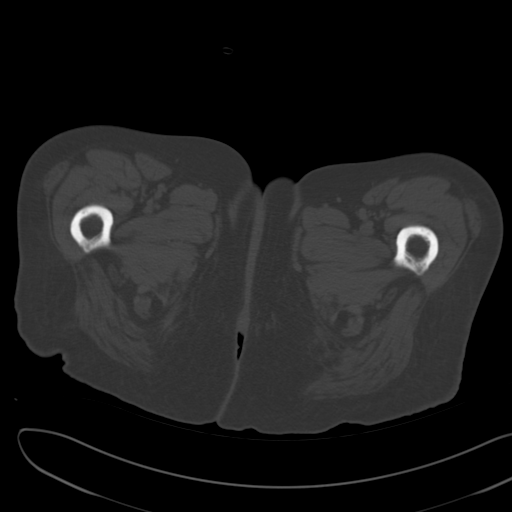
[im 20/154  soft-tissue]
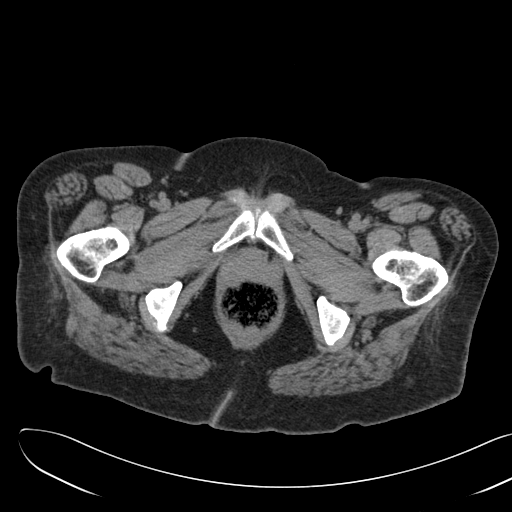
[im 32/154  soft-tissue]
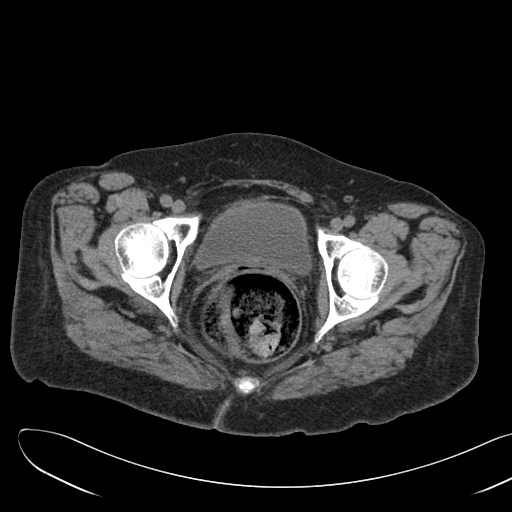
[im 45/154  soft-tissue]
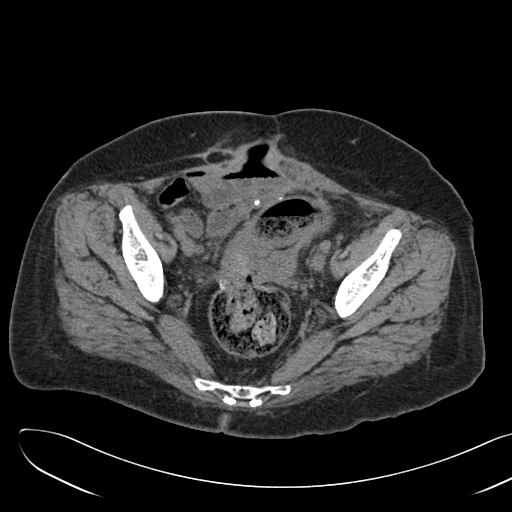
[im 52/154  soft-tissue]
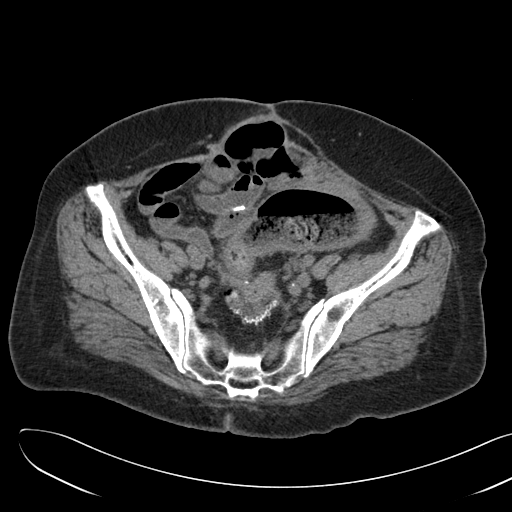
[im 64/154  soft-tissue]
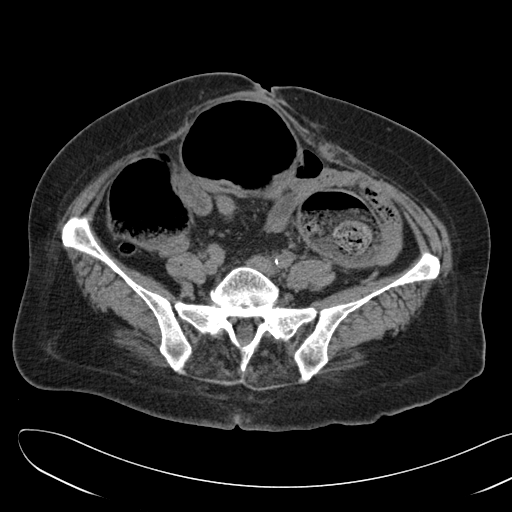
[im 77/154  soft-tissue]
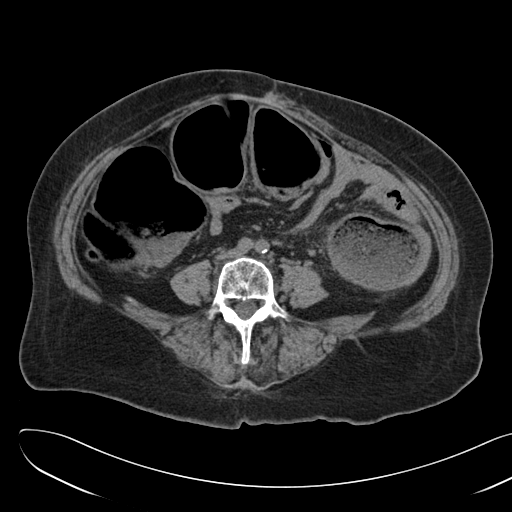
[im 90/154  soft-tissue]
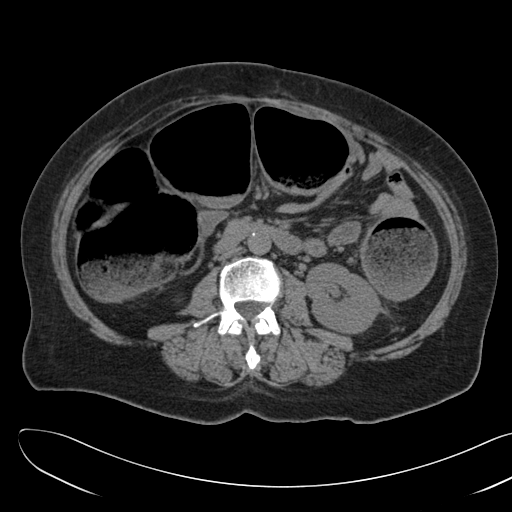
[im 103/154  soft-tissue]
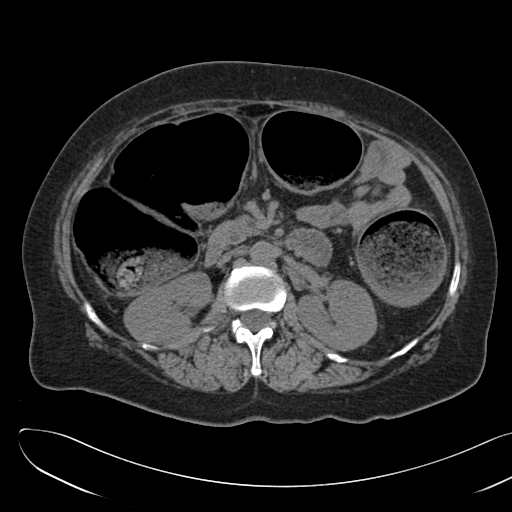
[im 103/154  bone]
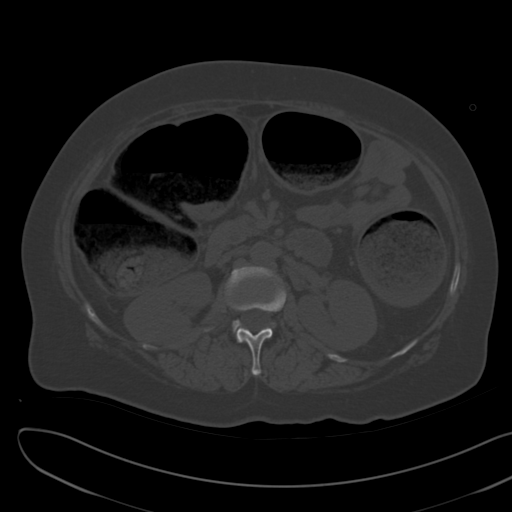
[im 109/154  soft-tissue]
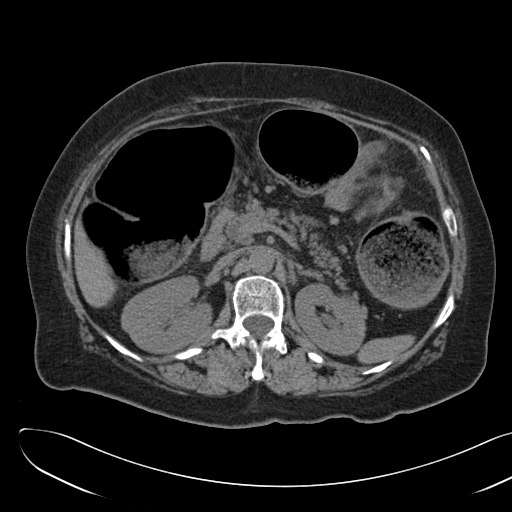
[im 122/154  soft-tissue]
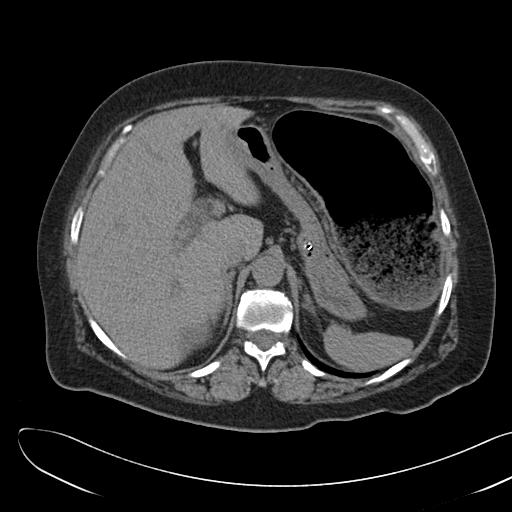
[im 128/154  lung]
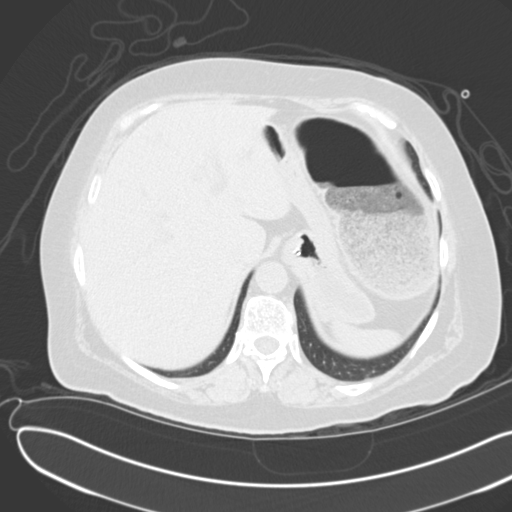
[im 134/154  soft-tissue]
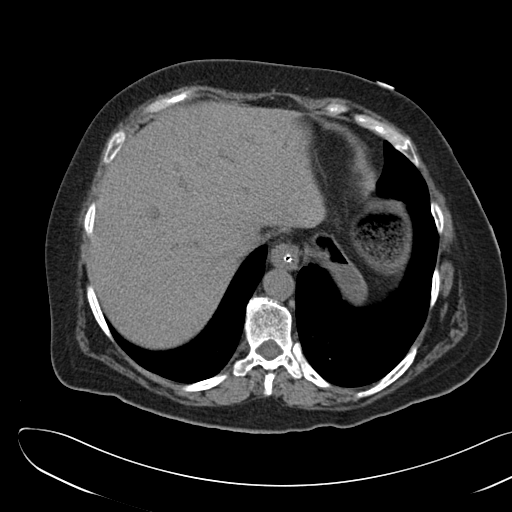
[im 134/154  lung]
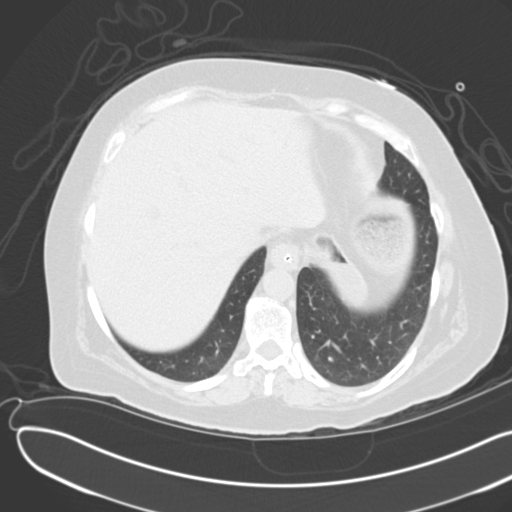
[im 141/154  lung]
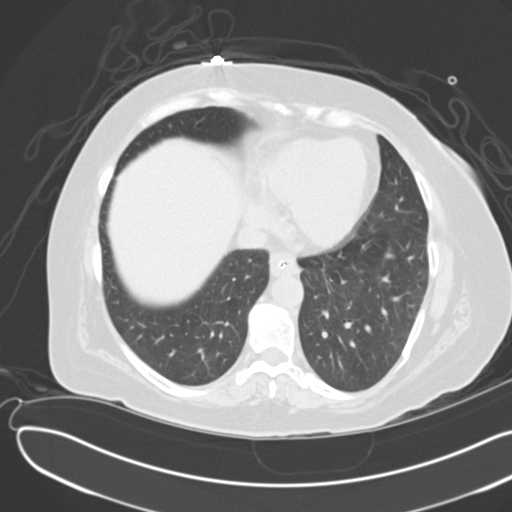
[im 147/154  soft-tissue]
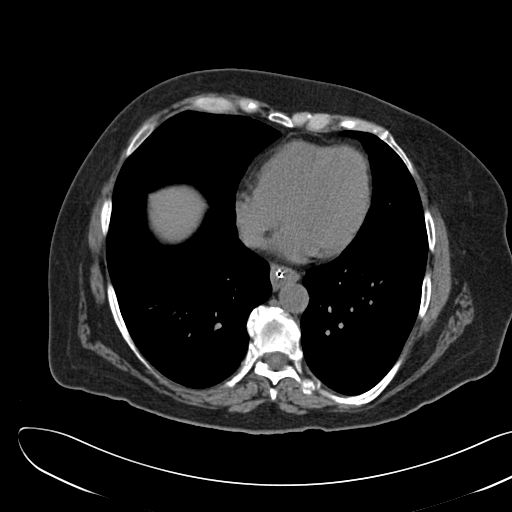
[im 147/154  lung]
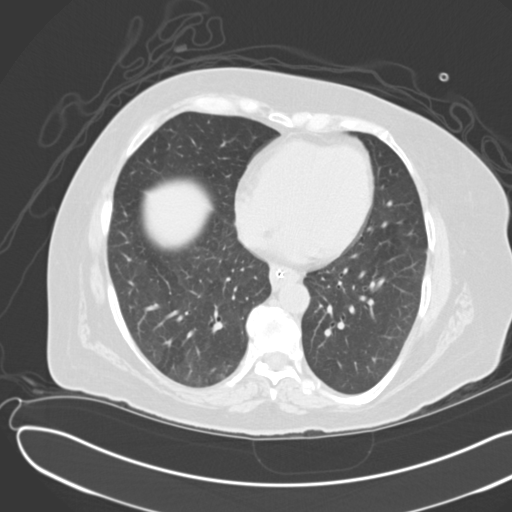

[15 of 32 positions shown; findings below may reference images not displayed]

FINDINGS: Lack of intravenous contrast limits evaluation of the solid abdominal
organs.  Grossly, the liver, spleen, adrenals, and pancreas are
unremarkable. There is likely a small duodenal diverticulum extending from
the second segment of the duodenum. The enteric tube terminates in the
stomach. The stomach is decompressed. Surgical clips are seen from prior
cholecystectomy. Mild thickening of the distal esophagus is likely secondary
to underdistention.

No renal calculi or hydronephrosis.

The small bowel is normal in caliber. The entire colon is moderately
distended with stool and air. There is focal narrowing at the suture line in
the inferior sigmoid colon. However, the distal sigmoid colon and rectum are
moderately distended with stool as well. The patient is status post
hysterectomy. The appendix measures 8 mm in diameter. However, it is
air-filled and there are no significant adjacent inflammatory changes. There
is a small bowel suture line in the anterior pelvis. No definite free
intraperitoneal air.

No aggressive lytic or sclerotic osseous lesions are identified.
IMPRESSION: 1. The entire colon is moderately distended with stool and air. There is
focal narrowing at the sigmoid suture line. However, the sigmoid colon and
rectum distally are mildly distended with stool as well. The findings may be
secondary to constipation. Some degree of obstruction cannot be excluded.
2. The appendix is mildly enlarged by size criteria. However, it is
air-filled and there are no adjacent inflammatory changes. Correlate for
appendicitis.
3. Mild wall thickening of the distal esophagus is likely secondary to
underdistention. However, further evaluation could provided with endoscopy,
as indicated.

## 2013-09-25 ENCOUNTER — Emergency Department: Payer: Self-pay | Admitting: Emergency Medicine

## 2014-01-29 ENCOUNTER — Emergency Department: Payer: Self-pay | Admitting: Emergency Medicine

## 2014-01-29 LAB — CBC
HCT: 40.5 % (ref 35.0–47.0)
HGB: 13.5 g/dL (ref 12.0–16.0)
MCH: 31.7 pg (ref 26.0–34.0)
MCHC: 33.3 g/dL (ref 32.0–36.0)
MCV: 95 fL (ref 80–100)
Platelet: 403 10*3/uL (ref 150–440)
RBC: 4.25 10*6/uL (ref 3.80–5.20)
RDW: 14.2 % (ref 11.5–14.5)
WBC: 6.2 10*3/uL (ref 3.6–11.0)

## 2014-01-29 LAB — COMPREHENSIVE METABOLIC PANEL
Albumin: 3.7 g/dL (ref 3.4–5.0)
Alkaline Phosphatase: 73 U/L
Anion Gap: 6 — ABNORMAL LOW (ref 7–16)
BUN: 8 mg/dL (ref 7–18)
Bilirubin,Total: 0.5 mg/dL (ref 0.2–1.0)
Calcium, Total: 9.1 mg/dL (ref 8.5–10.1)
Chloride: 104 mmol/L (ref 98–107)
Co2: 29 mmol/L (ref 21–32)
Creatinine: 0.65 mg/dL (ref 0.60–1.30)
EGFR (African American): >60
EGFR (Non-African Amer.): >60
Glucose: 86 mg/dL (ref 65–99)
Osmolality: 275 (ref 275–301)
Potassium: 3.7 mmol/L (ref 3.5–5.1)
SGOT(AST): 22 U/L (ref 15–37)
SGPT (ALT): 14 U/L (ref 12–78)
Sodium: 139 mmol/L (ref 136–145)
Total Protein: 7.7 g/dL (ref 6.4–8.2)

## 2014-01-29 LAB — URINALYSIS, COMPLETE
Bacteria: NONE SEEN
Bilirubin,UR: NEGATIVE
Blood: NEGATIVE
Glucose,UR: NEGATIVE mg/dL (ref 0–75)
Ketone: NEGATIVE
Leukocyte Esterase: NEGATIVE
Nitrite: NEGATIVE
Ph: 8 (ref 4.5–8.0)
Protein: NEGATIVE
RBC,UR: NONE SEEN /HPF (ref 0–5)
Specific Gravity: 1.006 (ref 1.003–1.030)
Squamous Epithelial: <1
WBC UR: NONE SEEN /HPF (ref 0–5)

## 2014-01-29 LAB — LIPASE, BLOOD: Lipase: 96 U/L (ref 73–393)

## 2014-02-01 ENCOUNTER — Emergency Department: Payer: Self-pay | Admitting: Emergency Medicine

## 2014-02-01 LAB — CBC
HCT: 46.2 % (ref 35.0–47.0)
HGB: 15.4 g/dL (ref 12.0–16.0)
MCH: 31.5 pg (ref 26.0–34.0)
MCHC: 33.2 g/dL (ref 32.0–36.0)
MCV: 95 fL (ref 80–100)
Platelet: 386 10*3/uL (ref 150–440)
RBC: 4.87 10*6/uL (ref 3.80–5.20)
RDW: 14.1 % (ref 11.5–14.5)
WBC: 6.5 10*3/uL (ref 3.6–11.0)

## 2014-02-01 LAB — BASIC METABOLIC PANEL
Anion Gap: 9 (ref 7–16)
BUN: 15 mg/dL (ref 7–18)
Calcium, Total: 9.5 mg/dL (ref 8.5–10.1)
Chloride: 96 mmol/L — ABNORMAL LOW (ref 98–107)
Co2: 27 mmol/L (ref 21–32)
Creatinine: 0.67 mg/dL (ref 0.60–1.30)
EGFR (African American): >60
EGFR (Non-African Amer.): >60
Glucose: 148 mg/dL — ABNORMAL HIGH (ref 65–99)
Osmolality: 268 (ref 275–301)
Potassium: 2.7 mmol/L — ABNORMAL LOW (ref 3.5–5.1)
Sodium: 132 mmol/L — ABNORMAL LOW (ref 136–145)

## 2014-02-01 LAB — TROPONIN I: Troponin-I: <0.02 ng/mL

## 2014-03-06 ENCOUNTER — Emergency Department: Payer: Self-pay | Admitting: Emergency Medicine

## 2014-03-06 LAB — CBC WITH DIFFERENTIAL/PLATELET
Basophil #: 0.1 10*3/uL (ref 0.0–0.1)
Basophil %: 0.7 %
Eosinophil #: 0.1 10*3/uL (ref 0.0–0.7)
Eosinophil %: 0.9 %
HCT: 40.8 % (ref 35.0–47.0)
HGB: 13.3 g/dL (ref 12.0–16.0)
Lymphocyte #: 2 10*3/uL (ref 1.0–3.6)
Lymphocyte %: 20.2 %
MCH: 31.1 pg (ref 26.0–34.0)
MCHC: 32.6 g/dL (ref 32.0–36.0)
MCV: 96 fL (ref 80–100)
Monocyte #: 0.6 x10 3/mm (ref 0.2–0.9)
Monocyte %: 6.4 %
Neutrophil #: 7.2 10*3/uL — ABNORMAL HIGH (ref 1.4–6.5)
Neutrophil %: 71.8 %
Platelet: 369 10*3/uL (ref 150–440)
RBC: 4.27 10*6/uL (ref 3.80–5.20)
RDW: 15 % — ABNORMAL HIGH (ref 11.5–14.5)
WBC: 10.1 10*3/uL (ref 3.6–11.0)

## 2014-03-06 LAB — COMPREHENSIVE METABOLIC PANEL
Albumin: 3.5 g/dL (ref 3.4–5.0)
Alkaline Phosphatase: 61 U/L
Anion Gap: 7 (ref 7–16)
BUN: 10 mg/dL (ref 7–18)
Bilirubin,Total: 0.8 mg/dL (ref 0.2–1.0)
Calcium, Total: 8.6 mg/dL (ref 8.5–10.1)
Chloride: 111 mmol/L — ABNORMAL HIGH (ref 98–107)
Co2: 22 mmol/L (ref 21–32)
Creatinine: 0.31 mg/dL — ABNORMAL LOW (ref 0.60–1.30)
EGFR (African American): >60
EGFR (Non-African Amer.): >60
Glucose: 94 mg/dL (ref 65–99)
Osmolality: 278 (ref 275–301)
Potassium: 4.7 mmol/L (ref 3.5–5.1)
SGOT(AST): 36 U/L (ref 15–37)
SGPT (ALT): 13 U/L — ABNORMAL LOW
Sodium: 140 mmol/L (ref 136–145)
Total Protein: 7.5 g/dL (ref 6.4–8.2)

## 2014-03-06 LAB — TROPONIN I: Troponin-I: <0.02 ng/mL

## 2014-03-06 LAB — LIPASE, BLOOD: Lipase: 135 U/L (ref 73–393)

## 2014-04-02 ENCOUNTER — Emergency Department: Payer: Self-pay | Admitting: Emergency Medicine

## 2014-04-10 ENCOUNTER — Ambulatory Visit: Payer: Medicaid Other | Admitting: Podiatry

## 2014-04-12 ENCOUNTER — Emergency Department: Payer: Self-pay | Admitting: Emergency Medicine

## 2014-04-12 LAB — CBC WITH DIFFERENTIAL/PLATELET
Basophil #: 0.1 10*3/uL (ref 0.0–0.1)
Basophil %: 0.8 %
Eosinophil #: 0.3 10*3/uL (ref 0.0–0.7)
Eosinophil %: 3.3 %
HCT: 36.8 % (ref 35.0–47.0)
HGB: 12.3 g/dL (ref 12.0–16.0)
Lymphocyte #: 3 10*3/uL (ref 1.0–3.6)
Lymphocyte %: 34 %
MCH: 31.6 pg (ref 26.0–34.0)
MCHC: 33.3 g/dL (ref 32.0–36.0)
MCV: 95 fL (ref 80–100)
Monocyte #: 0.7 x10 3/mm (ref 0.2–0.9)
Monocyte %: 8.6 %
Neutrophil #: 4.6 10*3/uL (ref 1.4–6.5)
Neutrophil %: 53.3 %
Platelet: 280 10*3/uL (ref 150–440)
RBC: 3.88 10*6/uL (ref 3.80–5.20)
RDW: 15 % — ABNORMAL HIGH (ref 11.5–14.5)
WBC: 8.7 10*3/uL (ref 3.6–11.0)

## 2014-04-12 LAB — COMPREHENSIVE METABOLIC PANEL
Albumin: 3.6 g/dL (ref 3.4–5.0)
Alkaline Phosphatase: 71 U/L
Anion Gap: 4 — ABNORMAL LOW (ref 7–16)
BUN: 11 mg/dL (ref 7–18)
Bilirubin,Total: 0.1 mg/dL — ABNORMAL LOW (ref 0.2–1.0)
Calcium, Total: 9.2 mg/dL (ref 8.5–10.1)
Chloride: 104 mmol/L (ref 98–107)
Co2: 32 mmol/L (ref 21–32)
Creatinine: 0.86 mg/dL (ref 0.60–1.30)
EGFR (African American): >60
EGFR (Non-African Amer.): >60
Glucose: 106 mg/dL — ABNORMAL HIGH (ref 65–99)
Osmolality: 279 (ref 275–301)
Potassium: 4.8 mmol/L (ref 3.5–5.1)
SGOT(AST): 12 U/L — ABNORMAL LOW (ref 15–37)
SGPT (ALT): 16 U/L
Sodium: 140 mmol/L (ref 136–145)
Total Protein: 7.2 g/dL (ref 6.4–8.2)

## 2014-04-13 LAB — URINALYSIS, COMPLETE
Bilirubin,UR: NEGATIVE
Blood: NEGATIVE
Glucose,UR: NEGATIVE mg/dL (ref 0–75)
Ketone: NEGATIVE
Leukocyte Esterase: NEGATIVE
Nitrite: NEGATIVE
Ph: 7 (ref 4.5–8.0)
Protein: NEGATIVE
RBC,UR: NONE SEEN /HPF (ref 0–5)
Specific Gravity: 1.004 (ref 1.003–1.030)
Squamous Epithelial: NONE SEEN
WBC UR: <1 /HPF (ref 0–5)

## 2014-04-19 ENCOUNTER — Ambulatory Visit: Payer: Medicaid Other | Admitting: Podiatry

## 2014-05-03 ENCOUNTER — Ambulatory Visit: Payer: Medicaid Other | Admitting: Podiatry

## 2014-05-14 ENCOUNTER — Encounter (HOSPITAL_COMMUNITY): Payer: Self-pay | Admitting: Emergency Medicine

## 2014-06-21 ENCOUNTER — Ambulatory Visit (INDEPENDENT_AMBULATORY_CARE_PROVIDER_SITE_OTHER): Payer: Medicaid Other | Admitting: Podiatry

## 2014-06-21 ENCOUNTER — Encounter: Payer: Self-pay | Admitting: Podiatry

## 2014-06-21 ENCOUNTER — Ambulatory Visit (INDEPENDENT_AMBULATORY_CARE_PROVIDER_SITE_OTHER): Payer: Medicaid Other

## 2014-06-21 VITALS — BP 123/79 | HR 61 | Resp 16 | Ht 62.0 in | Wt 156.0 lb

## 2014-06-21 DIAGNOSIS — M79676 Pain in unspecified toe(s): Secondary | ICD-10-CM

## 2014-06-21 DIAGNOSIS — B351 Tinea unguium: Secondary | ICD-10-CM

## 2014-06-21 DIAGNOSIS — Q828 Other specified congenital malformations of skin: Secondary | ICD-10-CM

## 2014-06-21 DIAGNOSIS — M205X1 Other deformities of toe(s) (acquired), right foot: Secondary | ICD-10-CM

## 2014-06-21 DIAGNOSIS — M7750 Other enthesopathy of unspecified foot: Secondary | ICD-10-CM

## 2014-06-21 DIAGNOSIS — M779 Enthesopathy, unspecified: Secondary | ICD-10-CM

## 2014-06-21 NOTE — Progress Notes (Signed)
Subjective:    Patient ID: Alison Walker, female    DOB: [birth year], 53 y.o.   MRN: [id number]  HPI Comments: 53 year old female presents the office they for multiple complaints. 1. Patient states that she previously has had cellulitis in the bottom of her feet. She was treated by her primary care physician with antibiotic for which she is unsure the name. She states that the area has resolved. Denies any systemic complaints as fevers, chills, nausea, vomiting. 2. Toenail fungus- Patient's primary care physician regarding prescribed Lamisil. She's been on medication for couple weeks. Denies any side effects the medication. 3. Left foot fourth and fifth digit nails have fallen off- denies any pain associated with the area or any running erythema or drainage. 4. Right big toe pain- patient states that she has broken her toe multiple times and she has arthritis in this area. 5. Painful calluses bilateral feet. She states that her daughter has tried to cut the areas out. She states of the areas are painful particularly with pressure.  No other complaints at this time.      Foot Pain      Review of Systems  Constitutional: Positive for appetite change.  HENT: Positive for hearing loss.   Gastrointestinal: Positive for constipation.  Skin:       Change in nails  Neurological: Positive for dizziness.  Psychiatric/Behavioral: The patient is nervous/anxious.   All other systems reviewed and are negative.      Objective:   Physical Exam AAO 3, NAD DP/PT pulses palpable bilaterally, CRT less than 3 seconds Protective sensation intact with Simms Weinstein monofilament, vibratory sensation intact, Achilles tendon reflex intact. Nails hypertrophic, dystrophic, elongated, brittle, discolored with a hallux nails most severe. Left fourth and fifth digit nails previously fallen off. Underlying skin is intact. There is no surrounding erythema, edema, drainage throughout any the nail  sites. Hyperkeratotic lesions along plantar hallux and along submetatarsal heads. There is a small amount of dried blood present underneath the calluses. Upon debridement no open lesions. There is no surrounding erythema, edema or drainage/purulence. No clinical signs of infection. Mild pain with range of motion of the right first MTPJ with crepitus upon range of motion. There is no overlying edema, erythema, increased warmth. No open lesions.  There is no cellulitis identified and there is no clinical signs of infection to the foot at this time. MMT 5/5, ROM WNL No pain with calf compression/swelling/warmth/erythema.      Assessment & Plan:   1.Plantar foot cellulitis-there is no evidence of infection at this time. Continue to monitor the area for any changes. 2. Onychomycosis- nails are sharply debrided 8 without complications. Continue with Lamisil as directed by primary care physician. Monitor for any side effects the medication which were discussed with her today. 3. 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 There is significant arthritis present and bone spurring on x-ray. There is no acute fracture identified on is evidence of chronic fracture. Discussed shoe gear modifications and possible inserts to help support her foot type. 4. Possible foreign body- upon x-ray there is a radiodense area on x-ray to the plantar aspect of the hallux on the right. This can be consistent with a foreign body or possible old fracture fragment. Patient does not relate any history of stepping on any foreign object in there is no evidence of puncture in the skin. There is no overlying clinical signs of infection. Discussed the patient to monitor this area for any changes.  If any changes are to occur to call the office. 5. Painful calluses bilateral feet- sharply debrided 4. There was a small amount of bleeding upon debridement as they have had evidence of dried blood underneath the callus. There is no  specific areas of ulceration. For an interbody ointment and a Band-Aid to the area. If the area has not healed within 2 weeks to call the office and make a follow-up appointment for further evaluation.  Follow-up as needed. In the meantime, call the office with any questions, concerns, change in symptoms. Monitoring clinical signs or symptoms of infection and directed to call the office immediately should any occur or go to the emergency room. Follow-up with PCP for other issues mentioned in the review of systems

## 2014-06-21 NOTE — Patient Instructions (Signed)

## 2014-06-28 ENCOUNTER — Telehealth: Payer: Self-pay | Admitting: *Deleted

## 2014-06-28 ENCOUNTER — Telehealth: Payer: Self-pay

## 2014-06-28 NOTE — Telephone Encounter (Signed)
Called left message stating i was returning her call.

## 2014-06-28 NOTE — Telephone Encounter (Signed)
Pt called wanting to talk to someone about the "place" on her right foot

## 2014-07-26 ENCOUNTER — Telehealth: Payer: Self-pay | Admitting: *Deleted

## 2014-07-26 NOTE — Telephone Encounter (Signed)
"  I need to see Dr. Jacqualyn Posey as soon as possible.  My right toe is giving me a fit.  So please call me at your earliest convenience.  Thank you."

## 2014-07-31 ENCOUNTER — Ambulatory Visit: Payer: Medicaid Other | Admitting: Podiatry

## 2014-08-02 ENCOUNTER — Ambulatory Visit (INDEPENDENT_AMBULATORY_CARE_PROVIDER_SITE_OTHER): Payer: Medicaid Other | Admitting: Podiatry

## 2014-08-02 ENCOUNTER — Encounter: Payer: Self-pay | Admitting: Podiatry

## 2014-08-02 VITALS — BP 100/57 | HR 80 | Resp 18

## 2014-08-02 DIAGNOSIS — M205X1 Other deformities of toe(s) (acquired), right foot: Secondary | ICD-10-CM

## 2014-08-02 DIAGNOSIS — Q828 Other specified congenital malformations of skin: Secondary | ICD-10-CM

## 2014-08-02 DIAGNOSIS — M7662 Achilles tendinitis, left leg: Secondary | ICD-10-CM

## 2014-08-02 DIAGNOSIS — M79673 Pain in unspecified foot: Secondary | ICD-10-CM

## 2014-08-05 NOTE — Progress Notes (Signed)
Patient ID: JOHNNISHA Walker, female   DOB: [birth year], 54 y.o.   MRN: [id number]  Subjective: 54 year old female returns the office for follow-up evaluation of painful calluses bilaterally as well as for pain into the right first big toe joint. She states that since last appointment the calluses have built up significantly she has difficulty with weightbearing and pressure. She denies any recent redness or drainage from around the sites. She also states that the big toe joint on the right foot has become very painful, particularly when trying to bend it and weightbearing. She again denies any overlying erythema, increase in warmth to this area. Denies any recent injury or trauma to bilateral lower extremity is. No other complaints at this time in no acute changes since last appointment. Denies any systemic complaints as fevers, chills, nausea, vomiting.  Objective: AAO 3, NAD DP/PT pulses palpable bilaterally, CRT less than 3 seconds Protective sensation appears to be intact with Derrel Nip monofilament, vibratory sensation intact, Achilles tendon reflex intact. Deep, annular, hyperkeratotic lesions along left plantar hallux, submetatarsal 5; right submetatarsal two. On the left plantar hallux there is evidence of dried blood. Upon debridement of this lesion there was a small amount of bleeding with a small, superficial abrasion to the skin underlying the callus. Upon debridement of the other lesions no underlying ulceration. There is no clinical signs of infection at this time. There is tenderness upon range of motion of the right first MTPJ with significant decrease in range of motion of the right first MTPJ greater than left. At the area of possible foreign body seen on x-ray there is no overlying hyperkeratotic tissue, evidence of a puncture wound, tenderness to palpation at that site, or any clinical signs of infection. There is no pain with calf compression, swelling, warmth, erythema. MMT 5/5,  ROM WNL except for 1st MTPJ  Assessment: 54 year old female with symptomatic porokeratosis; right symptomatic hallux limitus  Plan: -Treatment options were discussed with the patient including alternatives, risks, complications. -Hyperkeratotic lesions were sharply debrided 3 without complications. There was a small amount of bleeding noted along the left plantar hallux. Discussed the patient to keep this area clean and to apply antibiotic ointment and a Band-Aid daily until the area heals. If the area has not healed within 2 weeks or if the area becomes problematic to call the office and make a follow-up appointment. -Salinocaine was applied to the remaining lesions. Post procedure instructions were discussed the patient. -Discussed possible steroid injection of the right first MTPJ. Risks and complications of the injection were discussed the patient for which she understands abruptly consent to the procedure. Under sterile conditions a total of 1.5 mL of dexamethasone, 0.5% Marcaine plain was infiltrated into and around the first MTPJ under sterile preparation. Band-Aid was applied. Patient tolerated the injection well without complications. Post injection care was discussed with the patient. -Prescription for CMO with morton's extension was given to the patient.  -Follow-up in 4 weeks, or sooner should any problems arise. In the meantime, encouraged to call the office with any questions, concerns, change in symptoms.

## 2014-08-07 ENCOUNTER — Telehealth: Payer: Self-pay | Admitting: *Deleted

## 2014-08-07 NOTE — Telephone Encounter (Signed)
Pt states at the last visit her right 1st big toe was injected and her callouses shaved, but she is now having terrible pain in the big toe and the calloused areas don't feel like they have been shave sufficiently.  I informed the pt, at last visit she was instructed to make an appt if she did not have any improvement.  Pt agreed and I transferred to schedulers.

## 2014-08-08 ENCOUNTER — Ambulatory Visit: Payer: Medicaid Other | Admitting: Podiatry

## 2014-08-14 ENCOUNTER — Ambulatory Visit: Payer: Medicaid Other | Admitting: Podiatry

## 2014-08-21 ENCOUNTER — Encounter: Payer: Self-pay | Admitting: Podiatry

## 2014-08-21 ENCOUNTER — Ambulatory Visit (INDEPENDENT_AMBULATORY_CARE_PROVIDER_SITE_OTHER): Payer: Medicaid Other | Admitting: Podiatry

## 2014-08-21 VITALS — BP 127/80 | HR 67 | Resp 18

## 2014-08-21 DIAGNOSIS — Q828 Other specified congenital malformations of skin: Secondary | ICD-10-CM

## 2014-08-21 DIAGNOSIS — M79676 Pain in unspecified toe(s): Secondary | ICD-10-CM

## 2014-08-21 DIAGNOSIS — D492 Neoplasm of unspecified behavior of bone, soft tissue, and skin: Secondary | ICD-10-CM | POA: Diagnosis not present

## 2014-08-21 DIAGNOSIS — M2010 Hallux valgus (acquired), unspecified foot: Secondary | ICD-10-CM | POA: Diagnosis not present

## 2014-08-21 DIAGNOSIS — M2570 Osteophyte, unspecified joint: Secondary | ICD-10-CM

## 2014-08-21 DIAGNOSIS — M205X1 Other deformities of toe(s) (acquired), right foot: Secondary | ICD-10-CM

## 2014-08-27 NOTE — Progress Notes (Signed)
Patient ID: Alison Walker, female   DOB: Nov 28, 1960, 54 y.o.   MRN: [id number]   Subjective: 54 year old female returns the office today for continued care of right big toe joint pain as well as for symptomatic calluses. She states that on the lesions which the salinocaine was applied are feeling okay at this time. She has some discomfort to the plantar aspect the left hallux over the site of the hyperkeratotic lesion. She also states that she continues to have pain in her right big toe joint. She states of the injection did help however it lasted only approximate 2 days before the pain reoccurred. Denies any acute changes his last appointment and no other complaints at this time.  Objective: AAO x3, NAD DP/PT pulses palpable b/l, CRT < 3 sec Protective sensation intact with Simms Weinstein monofilament, Achilles tendon reflex intact. Is tender to palpation along the dorsal aspect of the right first MTPJ. There is mild discomfort with range of motion of the first MTPJ crepitus is present. There is no overlying edema, erythema, increase in warmth. There is no tenderness on the plantar aspect of the first MTPJ on the course of the hallux. On the plantar aspect left hallux there is a small hyperkeratotic lesion with tenderness palpation. Upon debridement there is no underlying ulceration or clinical signs of infection. The remaining sites of hyperkeratotic lesions which was debrided last appointment have no significant buildup at this time and there is no tenderness along the sites. No other areas of tenderness to bilateral lower extremities. No overlying edema, erythema, increase in warmth bilaterally. No other open lesions or pre-ulcer lesions identified. No pain with calf compression, swelling, warmth, erythema.  Assessment: 54 year old female with right hallux rigidus, symptomatically porokeratosis left plantar hallux  Plan: - Treatment options were discussed the patient include alternatives,  risks, complications. -At this time the patient requested another steroid injection on the first MTPJ. Risks and complications of a repeat injection were discussed the patient for which she understands and [name] consents. Under sterile Betadine preparation total of 1 mL mixture of dexamethasone phosphate and 0.5% Marcaine plain was infiltrated into and around the first MTPJ without complications. A Band-Aid was applied. Postinjection care was discussed with the patient. -Recommended the patient to follow-up with [facility] to have custom orthotics made. She states that she has been unable to do this due to her recent death in her family. -Hyperkeratotic lesion left plantar hallux is sharply debrided without complications. A pad was applied around the area followed by salinocaine and an occlusive dressing. Post procedure instructions were discussed the patient. -Follow-up as directed. In the meantime, encouraged call the office with any questions, concerns, change in symptoms.

## 2014-08-30 ENCOUNTER — Ambulatory Visit: Payer: Medicaid Other | Admitting: Podiatry

## 2014-09-07 ENCOUNTER — Inpatient Hospital Stay: Payer: Self-pay | Admitting: Internal Medicine

## 2014-09-07 DIAGNOSIS — I361 Nonrheumatic tricuspid (valve) insufficiency: Secondary | ICD-10-CM

## 2014-09-08 ENCOUNTER — Ambulatory Visit: Payer: Self-pay | Admitting: Urology

## 2014-09-11 ENCOUNTER — Ambulatory Visit: Payer: Medicaid Other | Admitting: Podiatry

## 2014-10-02 ENCOUNTER — Ambulatory Visit (INDEPENDENT_AMBULATORY_CARE_PROVIDER_SITE_OTHER): Payer: Medicaid Other | Admitting: Podiatry

## 2014-10-02 DIAGNOSIS — B351 Tinea unguium: Secondary | ICD-10-CM | POA: Diagnosis not present

## 2014-10-02 DIAGNOSIS — M79676 Pain in unspecified toe(s): Secondary | ICD-10-CM

## 2014-10-02 DIAGNOSIS — M205X1 Other deformities of toe(s) (acquired), right foot: Secondary | ICD-10-CM

## 2014-10-02 DIAGNOSIS — Q828 Other specified congenital malformations of skin: Secondary | ICD-10-CM

## 2014-10-03 NOTE — Progress Notes (Signed)
[name], female   DOB: 1961-03-24, 54 y.o.   MRN: [id number]  Subjective: Alison Walker returns Alison office today for continued care of right big toe joint pain as well as for symptomatic calluses. She states Alison callus and also doing well however she discontinued have pain in Alison callus and Alison right foot. She also has thick, painful elongated toenails. She is currently on Alison second month of Lamisil. Denies any redness or drainage around Alison nail sites. She also continues to have pain in Alison right big toe joint particularly with weightbearing and pressure. She is unable to bend Alison toe without any pain. Able to get custom orthotics due to cost. She denies any recent swelling or redness overlying Alison area. No other complaints at this time.   Objective: AAO x3, NAD DP/PT pulses palpable b/l, CRT < 3 sec Protective sensation intact with Simms Weinstein monofilament, Achilles tendon reflex intact. There is tenderness palpation overlying Alison right first MPJ and there is pain and crepitation with range of motion. There is no overlying edema, erythema, increase in warmth.  Hyperkeratotic lesion right foot submetatarsal 3, left plantar hallux. Upon debridement no ulceration, drainage, signs of infection and Alison skin is intact underlying Alison area. Nails are hypertrophic, dystrophic, elongated, brittle, discolored 10. There subjective tenderness on nails 1-5 bilaterally. No surrounding erythema or drainage. No other areas of tenderness to bilateral lower extremities. No overlying edema, erythema, increase in warmth bilaterally. No other open lesions or pre-ulcer lesions identified. No pain with calf compression, swelling, warmth, erythema.  Assessment: 54 year old female with right hallux rigidus, symptomatically porokeratosis left plantar hallux right submetatarsal 3, symptomatic onychomycosis  Plan: -Treatment options were discussed Alison Walker include alternatives, risks,  complications. -Hyperkeratotic lesion sharply debrided 2 without complication/bleeding -Nail sharply debrided 10 without complication/bleeding. -In regards to Alison right hallux rigidus I have discussed further conservative and surgical treatment. As she is repeatedly, to Alison office with continued pain to Alison area I did discuss surgical intervention with her. I discussed either an implant arthroplasty or a Keller procedure. Most likely we will proceed with a Jake Michaelis if she desired surgery. She seems to want to proceed with surgery. Given her cardiac history discussed she needs to have clearance from her cardiologist and/or primary care physician prior to surgery. She states that she will contact her other doctors in regards to this. When she obtains clearance call Alison office if she desires surgery. In Alison meantime occurs to call Alison office with any questions, concerns, change in symptoms.

## 2014-10-15 DIAGNOSIS — K439 Ventral hernia without obstruction or gangrene: Secondary | ICD-10-CM | POA: Insufficient documentation

## 2014-10-30 NOTE — Consult Note (Signed)
Brief Consult Note: Diagnosis: Collitis, C diff infection.   Patient was seen by consultant.   Comments: I have personally reviewed the CT angiogram of the visceral arteries dated 03/25/2012.  The SMA celiac and IMA are widely patent without evidence of significant atherosclerosis.  The study is quite good and there does not appear to be significant medium or small vessel disease.  venous phase is normal. I believe that the patietn continues to have ill effects from her c diff in combination with an intrisic colonic process.    No further intervention or vascular surgery indicated at this time.  Electronic Signatures: Hortencia Pilar (MD)  (Signed 13-Sep-13 21:17)  Authored: Brief Consult Note   Last Updated: 13-Sep-13 21:17 by Hortencia Pilar (MD)

## 2014-10-30 NOTE — Consult Note (Signed)
Chief Complaint:   Subjective/Chief Complaint Feels better. No vomiting. Two small BM today.   VITAL SIGNS/ANCILLARY NOTES: **Vital Signs.:   14-Sep-13 13:18   Vital Signs Type Routine   Temperature Temperature (F) 98.1   Celsius 36.7   Temperature Source oral   Pulse Pulse 63   Respirations Respirations 20   Systolic BP Systolic BP 080   Diastolic BP (mmHg) Diastolic BP (mmHg) 71   Mean BP 96   Pulse Ox % Pulse Ox % 98   Pulse Ox Activity Level  At rest   Brief Assessment:   Additional Physical Exam Abdomen is somewhat distended but without significant tenderness.   Lab Results: Routine Chem:  14-Sep-13 05:36    Glucose, Serum 75   BUN  20   Creatinine (comp)  0.58   Sodium, Serum 145   Potassium, Serum  3.1   Chloride, Serum  115   CO2, Serum  20   Calcium (Total), Serum  7.4   Anion Gap 10   Osmolality (calc) 290   eGFR (African American) >60   eGFR (Non-African American) >60 (eGFR values <48m/min/1.73 m2 may be an indication of chronic kidney disease (CKD). Calculated eGFR is useful in patients with stable renal function. The eGFR calculation will not be reliable in acutely ill patients when serum creatinine is changing rapidly. It is not useful in  patients on dialysis. The eGFR calculation may not be applicable to patients at the low and high extremes of body sizes, pregnant women, and vegetarians.)  Routine Hem:  14-Sep-13 05:36    WBC (CBC) 10.7   RBC (CBC)  3.29   Hemoglobin (CBC)  10.3   Hematocrit (CBC)  30.5   Platelet Count (CBC) 384   MCV 93   MCH 31.2   MCHC 33.7   RDW  14.8   Neutrophil % 69.7   Lymphocyte % 20.1   Monocyte % 8.9   Eosinophil % 0.8   Basophil % 0.5   Neutrophil #  7.4   Lymphocyte # 2.1   Monocyte # 0.9   Eosinophil # 0.1   Basophil # 0.1 (Result(s) reported on 26 Mar 2012 at 06:56AM.)   Assessment/Plan:  Assessment/Plan:   Assessment C. diff colitis, clinically better.    Plan Remove NG. Clear liquid  diet. If tolerates PO will prefer Flagyl to be changed to PO.   Electronic Signatures: IJill Side(MD)  (Signed 14-Sep-13 14:13)  Authored: Chief Complaint, VITAL SIGNS/ANCILLARY NOTES, Brief Assessment, Lab Results, Assessment/Plan   Last Updated: 14-Sep-13 14:13 by IJill Side(MD)

## 2014-10-30 NOTE — Consult Note (Signed)
Colon to cecum performed. Severe colitis affecting sigmoid and part of descending colon. Bx's taken. Likely ischemic again. However, check stool for c.diff. If bx c/w ischemic, then may need vascular involved to evaluate blood supply to colon. THanks.  Electronic Signatures: Verdie Shire (MD)  (Signed on 12-Sep-13 16:53)  Authored  Last Updated: 12-Sep-13 16:53 by Verdie Shire (MD)

## 2014-10-30 NOTE — Consult Note (Signed)
PATIENT NAME:  Alison Walker, Alison Walker MR#:  [id number] DATE OF BIRTH:  Jan 29, 1961  DATE OF CONSULTATION:  03/24/2012  REFERRING PHYSICIAN:   CONSULTING PHYSICIAN:  Harrell Gave A. Jaci Desanto, MD  REASON FOR CONSULTATION: Abdominal pain and lactic acidosis.   HISTORY OF PRESENT ILLNESS: Ms. Spearing is a pleasant 54 year old female who has a history of Hartmann's procedure for diverticular perforation status post reversal. She has had multiple abdominal operations including C-section, hysterectomy, unilateral oophorectomies, ventral hernia repair with mesh with removal, and repair of enterocutaneous fistula in 2010. She was admitted in July for presumed ischemic colitis. She was discharged home on oral antibiotics. She now presents with one day of worsening abdominal pain. She says it was worse in the past. Otherwise no nausea, vomiting, fevers, chills, night sweats, chest pain or shortness of breath. She does have an elevated lactic acid at this time.    PAST MEDICAL HISTORY:  1. History of ischemic colitis.  2. History of acute renal failure with dehydration which resolved. 3. Hypertension.  4. Chronic obstructive pulmonary disease. 5. Gastroesophageal reflux disease.  6. History of multiple abdominal surgeries, see above.  7. Anxiety.   MEDICATIONS: Aspirin p.o. daily Atenolol  Citrucel  Colace  Lisinopril Omeprazole.   ALLERGIES: Ciprofloxacin and morphine.   SOCIAL HISTORY: Denies tobacco and alcohol.   REVIEW OF SYSTEMS: Total 12 point review of systems was conducted and all pertinent positives and negatives are presented below.  PHYSICAL EXAMINATION:  VITAL SIGNS: At the time when I visited her yesterday, her temperature was 98.1, pulse 80, blood pressure 94/55, respirations 20, and saturation 96% on room air.   GENERAL: No acute distress, alert and oriented x3.   HEAD: Normocephalic, atraumatic.   EYES: No scleral icterus. No conjunctivitis.   FACE: normal external nose, normal  external ears  CHEST: Lungs clear to auscultation, moving air well.   HEART: Regular rate and rhythm. No murmurs, rubs, or gallops.   ABDOMEN: Soft, mild diffuse tenderness, and nondistended.   EXTREMITIES: Moves all extremities well, strength 5/5 all 4 extremities  RESULTS:  I have personally read Ms. Tuckers CT scan and evaluated her laboratory values.   Her CT is significant for dilated colon diffusely with stool throughout colon and rectal vault. No obvious free abdominal fluid. No obvious free abdominal air. No obvious thickening of the colon.  Labs are remarkable for a white cell count of 16, hemoglobin and hematocrit 16.3 and 51.1, and platelets 692.   Renal panel is significant for lactic acid of 4.2, bicarbonate of 18, and creatinine of 2.3.   ASSESSMENT AND PLAN: Ms. Glotfelty is a pleasant 54 year old female with one day of abdominal pain in the context of ischemic colitis. Her CT scan is not particularly concerning for ischemic colitis, however, she does have dilated diffuse colon and is tender. The differential includes atonic colon potentially due to narcotics and she also may be constipated. Consider resuscitation and IV       antibiotics at this time. Would recommend following exam, lactate and creatinine to see if this will improve with resuscitation because surgery would be difficult given multiple prior surgeries.  Follow-up on gastroenterology recommendations and they may decide to perform colonoscopy to evaluate the patient's lower GI tract. We will continue following with you.  ____________________________ Glena Norfolk. Shirin Echeverry, MD cal:slb D: 03/24/2012 06:22:02 ET T: 03/24/2012 10:29:43 ET JOB#: [id number]  cc: Harrell Gave A. Willistine Ferrall, MD, <Dictator> Floyde Parkins MD ELECTRONICALLY SIGNED 03/25/2012 16:16

## 2014-10-30 NOTE — H&P (Signed)
PATIENT NAME:  Alison Walker, Alison Walker MR#:  [id number] DATE OF BIRTH:  [birth year]  DATE OF ADMISSION:  02/06/2012  REFERRING PHYSICIAN: Conni Slipper, MD    FAMILY PHYSICIAN: None.   REASON FOR ADMISSION: Abdominal pain with diarrhea.   HISTORY OF PRESENT ILLNESS: The patient is a 54 year old female with a history of chronic pain, recurrent diverticulosis requiring colectomy in the past, and fairly recent admission for questionable ischemic colitis who has not had outpatient follow-up since her last hospitalization approximately six weeks ago. She presents to the Emergency Room now with recurrent abdominal pain which is generalized, associated with diarrhea. In the Emergency Room, the patient was noted to have leukocytosis. CT scan showed diffuse inflammation consistent with colitis. She is now admitted for further evaluation.   PAST MEDICAL/SURGICAL HISTORY:  1. History of recurrent diverticulitis, status post partial colectomy with transient colostomy in 1998.  2. Questionable history of ischemic colitis.  3. Hiatal hernia.  4. Gastroesophageal reflux.  5. History of pneumonia.  6. Chronic obstructive pulmonary disease/tobacco abuse.  7. Questionable history of Wolff-Parkinson-White syndrome.  8. Status post hysterectomy.  9. Status post cholecystectomy.  10. Status post back surgery.  11. Status post abdominal hernia repair in 2005.   MEDICATIONS:  1. Zofran 4 mg p.o. every 8 hours p.r.n. nausea or vomiting.  2. Percocet 5/325, 1 to 2 p.o. every 6 hours p.r.n. pain. 3. Prilosec 40 mg p.o. daily.  4. Zestril 10 mg p.o. daily.  5. Flexeril 10 mg p.o. every 8 hours p.r.n. pain.  6. Colace 100 mg p.o. daily.  7. Citrucel 2 tablets p.o. daily.  8. Atenolol 50 mg p.o. daily.  9. Align 1 p.o. daily.  10. Aspirin 81 mg p.o. daily.   ALLERGIES: Morphine.   SOCIAL HISTORY: The patient continues to smoke. She denies alcohol abuse.   FAMILY HISTORY: Negative for coronary artery disease and  hypertension. Negative for diabetes. Positive for stroke.   REVIEW OF SYSTEMS: CONSTITUTIONAL: No fever or change in weight. EYES: No blurred or double vision. No glaucoma. ENT: No tinnitus or hearing loss. No nasal discharge or bleeding. No difficulty swallowing. RESPIRATORY: No cough or wheezing. Denies hemoptysis. No painful respiration. CARDIOVASCULAR: No chest pain or orthopnea. Denies palpitations or syncope. GI: Some nausea but no vomiting. Definite diarrhea and abdominal pain. Bowel habits have changed as per history of present illness. No melena or bright red blood per rectum. GU: No dysuria or hematuria. No incontinence. ENDOCRINE: No polyuria or polydipsia. No heat or cold intolerance. HEMATOLOGIC: The patient denies anemia, easy bruising, or bleeding. LYMPHATIC: No swollen glands. MUSCULOSKELETAL: The patient denies pain in her back but denies pain in her neck, shoulders, knees, or hips. No gout. NEUROLOGIC: No numbness, although she does feel weak. Denies migraines, stroke or seizures. PSYCHIATRIC: The patient denies anxiety, insomnia, or depression.   PHYSICAL EXAMINATION:  GENERAL: The patient is acutely ill-appearing, in moderate distress.   VITAL SIGNS: Vital signs are remarkable for a blood pressure of 113/59, with a heart rate of 82, and a respiratory rate of 20. She is afebrile.   HEENT: Normocephalic, atraumatic. Pupils are equally round and reactive to light and accommodation. Extraocular movements are intact. Sclerae are anicteric. Conjunctivae are clear.  Oropharynx is clear.    NECK: Supple without jugular venous distention or bruits. No adenopathy or thyromegaly is noted. .  LUNGS: Clear to auscultation and percussion without wheezes, rales, or rhonchi. No dullness.   CARDIAC: Regular rate and rhythm with normal S1  and S2. No significant rubs, murmurs, or gallops. PMI is nondisplaced. Chest wall is nontender.   ABDOMEN: Soft but diffusely tender. There is guarding but no  rebound. There are multiple scars noted. No organomegaly or masses appreciated. Stool was guaiac-negative per the Emergency Room physician.   EXTREMITIES: Without clubbing, cyanosis, or edema. Pulses were 2+ bilaterally.   SKIN: Warm and dry without rash or lesions.   NEUROLOGIC: Cranial nerves II through XII are grossly intact. Deep tendon reflexes were symmetric. Motor and sensory exam is nonfocal.   PSYCHIATRIC: The patient was alert and oriented to person, place, and time. She was cooperative and used good judgment.   LABORATORY, DIAGNOSTIC AND RADIOLOGICAL DATA: Lactic acid was 1.6. White count was 20.6 with a hemoglobin of 17.4. Magnesium was 2.6. Troponin was less than 0.02. Lipase was 68. Glucose was 143, with a BUN of 51, and a creatinine 1.72, with a sodium of 138 and a potassium of 5.2. GFR was 34. Chest x-ray was unremarkable. CT of the abdomen revealed transverse and descending portions of the colon with diffuse wall thickening. There was no evidence of bowel obstruction. A discrete abscess was not demonstrated. There was a broad-necked ventral hernia noted as well. There was also mild thickening of the pylorus and the first and second portions of the duodenum.   ASSESSMENT:  1. Abdominal pain.  2. Diarrhea with dehydration.  3. Acute renal failure due to dehydration.  4. Colitis, questionably ischemic versus diverticular in nature.  5. Chronic pain. 6. Chronic obstructive pulmonary disease/tobacco abuse.  7. Multiple previous abdominal surgeries.  8. Gastroesophageal reflux disease.  9. Remote history of Wolff-Parkinson-White syndrome.   PLAN: The patient will be admitted to the floor with IV fluids and IV antibiotics. Blood cultures have been sent off in the Emergency Room. We will begin a clear liquid diet and guaiac all stools. We will consult surgery and GI in regards to her abdominal pain and questionable ischemic colitis. We will follow her renal function closely after  hydration with IV fluids. We will also follow her hemoglobin and white count as well. We will use a NicoDerm patch at this time as the patient will abstain from smoking while in the hospital. We will continue her blood pressure medicines as per her outpatient regimen. Follow up routine abdominal films in the morning. We will send off stool for C. difficile. Further treatment and evaluation will depend upon the patient's progress.   TOTAL TIME SPENT: 50 minutes.   ____________________________ Leonie Douglas Doy Hutching, MD jds:cbb D: 02/06/2012 18:34:23 ET T: 02/07/2012 07:57:13 ET JOB#: [id number]  cc: Leonie Douglas. Doy Hutching, MD, <Dictator> Kholton Coate Lennice Sites MD ELECTRONICALLY SIGNED 02/07/2012 13:26

## 2014-10-30 NOTE — Consult Note (Signed)
Chief Complaint:   Subjective/Chief Complaint Feels better. Positive c.diff. On flagyl now.   VITAL SIGNS/ANCILLARY NOTES: **Vital Signs.:   13-Sep-13 09:19   Vital Signs Type Q 4hr   Temperature Temperature (F) 97.5   Celsius 36.3   Temperature Source oral   Pulse Pulse 90   Respirations Respirations 20   Systolic BP Systolic BP 100   Diastolic BP (mmHg) Diastolic BP (mmHg) 75   Mean BP 95   Pulse Ox % Pulse Ox % 96   Pulse Ox Activity Level  At rest   Oxygen Delivery Room Air/ 21 %   Brief Assessment:   Cardiac Regular    Respiratory clear BS    Gastrointestinal low abd tenderness   Lab Results: Hepatic:  13-Sep-13 03:59    Bilirubin, Total 0.6   Alkaline Phosphatase  163   SGPT (ALT) 13   SGOT (AST) 15   Total Protein, Serum  5.1   Albumin, Serum  2.2  Routine Chem:  13-Sep-13 03:59    Glucose, Serum  109   BUN  41   Creatinine (comp) 0.72   Sodium, Serum  146   Potassium, Serum 3.6   Chloride, Serum  117   CO2, Serum  19   Calcium (Total), Serum  7.6   Osmolality (calc) 301   eGFR (African American) >60   eGFR (Non-African American) >60 (eGFR values <60m/min/1.73 m2 may be an indication of chronic kidney disease (CKD). Calculated eGFR is useful in patients with stable renal function. The eGFR calculation will not be reliable in acutely ill patients when serum creatinine is changing rapidly. It is not useful in  patients on dialysis. The eGFR calculation may not be applicable to patients at the low and high extremes of body sizes, pregnant women, and vegetarians.)   Anion Gap 10  Routine Hem:  13-Sep-13 03:59    WBC (CBC)  12.1   RBC (CBC) 3.86   Hemoglobin (CBC) 12.1   Hematocrit (CBC) 36.1   Platelet Count (CBC) 423   MCV 94   MCH 31.3   MCHC 33.4   RDW  15.2   Neutrophil % 71.1   Lymphocyte % 16.0   Monocyte % 12.5   Eosinophil % 0.2   Basophil % 0.2   Neutrophil #  8.6   Lymphocyte # 1.9   Monocyte #  1.5   Eosinophil # 0.0    Basophil # 0.0 (Result(s) reported on 25 Mar 2012 at 04:56AM.)   Assessment/Plan:  Assessment/Plan:   Assessment Hx of ischemic colitis. Severe c.diff colitis now.    Plan Continue with flagyl. Stop broad spectrum Abx if no evidence of sepsis. Dr. IErnst Breachwill see patient over the weekend. THanks.   Electronic Signatures: OVerdie Shire(MD)  (Signed 13-Sep-13 11:11)  Authored: Chief Complaint, VITAL SIGNS/ANCILLARY NOTES, Brief Assessment, Lab Results, Assessment/Plan   Last Updated: 13-Sep-13 11:11 by OVerdie Shire(MD)

## 2014-10-30 NOTE — Consult Note (Signed)
Full consult to follow. Readmission for increasing low abd pain, diarrhea,  hematochizia, and CT suggesting colitis. Also has hernias and hx of partial colectomy. Had colonoscopy done at [facility] 1 yr ago. Does not remember the results. C.diff neg. Infectious vs ischemic. Agree with Abx. Plan colonoscopy tomorrow with bx. Colonoscopy may be difficult due to hernias  and prior colon surgery. Thanks.   Electronic Signatures: Verdie Shire (MD) (Signed on 28-Jul-13 10:19)  Authored   Last Updated: 28-Jul-13 10:21 by Verdie Shire (MD)

## 2014-10-30 NOTE — Consult Note (Signed)
Brief Consult Note: Comments: Received a consult today. Dr Candace Cruise is already on the case. Thanks.  Electronic Signatures: Jill Side (MD)  (Signed 13-Sep-13 15:23)  Authored: Brief Consult Note   Last Updated: 13-Sep-13 15:23 by Jill Side (MD)

## 2014-10-30 NOTE — Consult Note (Signed)
Full consult to follow. Chronic abd pain with worsening pain x 2 days assoc with nausea/vomiting and hematochezia. Discussed with surgery last night. Had ischemic colitis on admission. CT without contrast did not show obvious colitis this time. However, lactic acid level high. Started IVF and Abx overnight. NG placed to suction last night. Low abd diffusely tender but no rebound or guarding. Will give tap water enemas this AM. Plan flex sig later this afternoon to see if ischemic colitis still present. Thanks  Electronic Signatures: Verdie Shire (MD)  (Signed on 12-Sep-13 07:18)  Authored  Last Updated: 12-Sep-13 07:18 by Verdie Shire (MD)

## 2014-10-30 NOTE — Consult Note (Signed)
PATIENT NAME:  Alison Walker, Alison Walker MR#:  [id number] DATE OF BIRTH:  12-Feb-1961  DATE OF CONSULTATION:  03/24/2012  REFERRING PHYSICIAN:   CONSULTING PHYSICIAN:  Lupita Dawn. Dewey Neukam, MD  REASON FOR REFERRAL: Abdominal pain, diarrhea, and hematochezia.  DISCUSSION: The patient is a 54 year old white female with chronic abdominal pain with recurrent diverticulosis which required partial colectomy in the past. She has been admitted in the past with possible ischemic colitis which was confirmed the last time she was hospitalized in August by colonoscopy and biopsies. She had been doing fairly well until a couple of days ago when she started having rather severe low abdominal pain and cramping associated with nausea, vomiting, and hematochezia again. There was no fever or chills. I was asked to evaluate the patient to see whether this is recurrent or persistent ischemic colitis or something more serious.   When she was admitted last time she was evaluated by surgery first. They did not feel that surgery was necessary at that time. I discussed that with the surgeon last night. I saw the patient early this morning. She still has a fair amount of low abdominal pain but it is a little better than last night. She has a NG tube in place that shows no gross blood through the NG aspirate.   PAST MEDICAL HISTORY:  1. Recurrent diverticulitis with surgery in 1998.  2. Abdominal hernia. 3. Chronic obstructive pulmonary disease.  4. Reflux.   PAST SURGICAL HISTORY:  1. Hysterectomy.  2. Cholecystectomy.  3. Back surgery. 4. Partial colectomy. 5. Hernia repair.   MEDICATIONS AT HOME: Zofran, Percocet, Prilosec.   ADDITIONAL MEDICATIONS:  Zestril, Flexeril, aspirin, Align, atenolol, and Colace.   ALLERGIES: She is allergic to morphine.   SOCIAL HISTORY: She smokes but does not drink any alcohol.   FAMILY HISTORY: Notable for stroke.   REVIEW OF SYSTEMS: No fevers or chills. There is some increasing weakness. There  is no chest pain or palpitations. No coughing or shortness of breath. The GI symptoms have been described already. There is no visual or hearing change.   PHYSICAL EXAMINATION:   GENERAL: The patient is in mild distress because of the pain. She does have a NG in.  VITALS: She is afebrile this morning with temperature of 99.6, pulse 93, respirations 20, blood pressure 96/67, and pulse oximetry 92% on room air.   HEENT: Normocephalic, atraumatic head. Pupils are equally reactive. Throat was clear.   NECK: Supple.   CARDIAC: Regular rhythm and rate without murmurs.   LUNGS: Clear bilaterally.   ABDOMEN: Normoactive bowel sounds, soft. There is some diffuse tenderness mostly in the lower abdomen. There is no guarding or rebound. There are no palpable masses.   EXTREMITIES: No clubbing, cyanosis, or edema.   SKIN: Grossly negative.   NEUROLOGIC: Nonfocal.  LABORATORY, DIAGNOSTIC AND RADIOLOGIC DATA: This morning sodium was 141, potassium 3.9, chloride 113, CO2 18, BUN 53, creatinine 1.44, and glucose 124. Liver enzymes were normal, except for alkaline phosphatase at 247. CPK enzymes were normal. White count was 14.6, hemoglobin 14.2, and platelet count 559.   Blood cultures were drawn.   CT scan without contrast shows some moderate distention of the colon with stool and air. There may be some focal narrowing at the sigmoid suture line. There may be some constipation. I did not see any obvious colitis on the CT scan. Appendix was somewhat enlarged but no inflammation was noted. There is some thickening of the esophagus as well.   ASSESSMENT  AND RECOMMENDATIONS: This is a patient with known history of ischemic colitis with recurrent abdominal pain. There is no evidence mesenteric infarct. The patient is currently n.p.o. Aggressive IV hydration was started which is appropriate because she is dehydrated. We will try to do a flexible sigmoidoscopy later this afternoon to see if ischemic colitis  is persistent or recurrent. We will try to give her some tap water enemas this morning to clear out at least distally. At this point, I would not want to give anything by mouth until she is clinically improved. Thank you for the referral. ____________________________ Lupita Dawn. Candace Cruise, MD pyo:slb D: 03/24/2012 11:56:50 ET T: 03/24/2012 12:36:11 ET JOB#: [id number]  cc: Lupita Dawn. Candace Cruise, MD, <Dictator> Lupita Dawn Taelar Gronewold MD ELECTRONICALLY SIGNED 03/24/2012 15:30

## 2014-10-30 NOTE — Consult Note (Signed)
Brief Consult Note: Diagnosis: colitis, infectious vs ischemia (less likely), chrnoic abdominal pain, ventral hernia.   Patient was seen by consultant.   Consult note dictated.   Comments: At present would r/o c diff, seek GI evaluation. no indication for surgical intervention which I would recommend be done at [facility] or [facility].  Electronic Signatures: Sherri Rad (MD)  (Signed 28-Jul-13 09:31)  Authored: Brief Consult Note   Last Updated: 28-Jul-13 09:31 by Sherri Rad (MD)

## 2014-10-30 NOTE — Discharge Summary (Signed)
PATIENT NAME:  Alison Walker, Alison Walker MR#:  [id number] DATE OF BIRTH:  05-21-1961  DATE OF ADMISSION:  03/23/2012 DATE OF DISCHARGE:  03/30/2012  ADMITTING PHYSICIAN: Max Sane, MD  DISCHARGING PHYSICIAN: Gladstone Lighter, MD  PRIMARY CARE PHYSICIAN: [facility]   DISCHARGE DIAGNOSES:  1. Clostridium difficile colitis.  2. History of diverticulosis status post colostomy and then reversal done in the past.  3. History of severe ischemic colitis.  4. Gastroesophageal disease.  5. Chronic obstructive pulmonary disease. 6. Hiatal hernia. 7. Acute renal failure, which has resolved.  8. Hypokalemia which is improved.  9. Sepsis on admission secondary to Clostridium difficile which is resolved now.    DISCHARGE MEDICATIONS:  1. Atenolol 50 mg p.o. daily.  2. Lisinopril 10 mg p.o. daily.  3. Colace 100 mg p.o. daily.  4. Omeprazole 40 mg p.o. daily.  5. Aspirin 81 mg p.o. daily.  6. Citrucel 500 mg p.o. every other day.  7. Metronidazole 500 mg p.o. every 8 hours for eight days.  8. Percocet 10/325 mg one tablet p.o. three times daily as needed for pain.  9. Mylanta suspension 30 mL twice daily as needed for indigestion and gas and heartburn. 10. Xanax 0.5 mg every 8 hours as needed for anxiety.   DISCHARGE DIET: Soft diet, eat light for the first meal.  DISCHARGE ACTIVITY: As tolerated.  FOLLOWUP INSTRUCTIONS:  1. Follow up with the [facility] in one week.  2. Follow up with gastroenterology at [facility] in 2 to 3 weeks.   LABS AND IMAGING STUDIES: WBC 7.7, hemoglobin 10.0, hematocrit 29.3, and platelet count 278.   Sodium 142, potassium 3.3, chloride 109, bicarbonate 24, BUN 2, creatinine 0.46, glucose 96, and calcium 7.5.  CT angiography of the abdomen and pelvis showed widely patent distal vessels. Portal venous system patent. No evidence of ischemic bowel. Sigmoid colonic wall thickening is present. Colitis could present in this fashion. Sigmoid colonic stricture at the level  of anastomosis appears to be present. No evidence of significant bowel obstruction seen.   Stool for Clostridium difficile was positive, on 03/25/2012.   Lactic acid on admission was 4 and went down to 1.5.   Flexible sigmoidoscopy biopsy is showing fragments of fibrinopurulent and partially necrotic debris and fragment of colonic mucosa with severe ischemic changes consistent with clinical history of ischemic colitis and negative for malignancy.   Urinalysis is negative for infection.   Blood cultures are negative.   CT of the abdomen and pelvis without contrast on admission is showing entire colon is moderately distended with stool and air. There is focal narrowing at the sigmoid suture line, however, sigmoid colon and rectum distally are mildly distended with stool as well secondary to constipation. Some degree of obstruction cannot be excluded. Appendix is mildly enlarged, however, it is air-filled and there no adjacent inflammatory changes. Mild wall thickening of the distal esophagus is likely secondary to underdistention. Further evaluation could be provided with endoscopy.   She was in acute renal failure with creatinine of 3.28 and WBC was 16.0 on admission.   BRIEF HOSPITAL COURSE: Alison Walker is a 54 year old Caucasian female with past medical history significant for history of ischemic colitis and severe diverticulitis resulting in partial colonic resection who has history of colostomy and reversal before with chronic abdominal pain who presents with worsening abdominal pain, nausea, vomiting, and diarrhea. She also had melena on presentation. She was initially admitted to the floor and had gastroenterology and surgical consults. 1. Abdominal pain,  nausea, vomiting, and diarrhea secondary to C. difficile colitis. Especially with her prior history of ischemic colitis and her passing of dark stools, vascular was consulted and she had angiogram done of her distal vessels which looked patent. So  she had a flexible sigmoidoscopy done by Dr. Candace Cruise at the bedside and that showed pseudomembrane significant for C. difficile and she had a stool study that was positive for C. difficile. She was placed on Flagyl three times a day with clinical improvement so that has been changed to oral form and is being discharged home. She has mild abdominal pain still which is much improved than when she came in and of note she does have a history of chronic abdominal pain too. She is tolerating soft diet well and her diarrhea has improved. She was advised to followup with gastroenterology at [facility] in 2 to 3 weeks.  2. Hypertension. She is on atenolol and lisinopril. 3. Gastroesophageal reflux disease. She is on omeprazole and also Mylanta suspension as needed.  4. Acute renal failure on admission. That has been resolved with fluids. She was also hypokalemic and electrolytes were replaced and stable after her diarrhea has resolved.  5. Sepsis on admission secondary to C. difficile colitis, which improved as she was treated.   Her overall course has been otherwise uneventful in the hospital.   DISCHARGE CONDITION: Stable.   DISCHARGE DISPOSITION: Home. Care manager has made an appointment with the [facility] for follow-up on Wednesday, 04/06/2012 at 11:45 a.m.  TIME SPENT ON DISCHARGE: 45 minutes.  ____________________________ Gladstone Lighter, MD rk:slb D: 03/30/2012 14:25:27 ET     T: 03/30/2012 15:21:15 ET       JOB#: [id number] cc: [facility] Gladstone Lighter MD ELECTRONICALLY SIGNED 04/01/2012 17:05

## 2014-10-30 NOTE — Consult Note (Signed)
Chief Complaint:   Subjective/Chief Complaint Overall better. Less abd pain and diarrhea. Flagyl switched to po.   VITAL SIGNS/ANCILLARY NOTES: **Vital Signs.:   16-Sep-13 13:53   Vital Signs Type Routine   Temperature Temperature (F) 98.6   Celsius 37   Temperature Source Oral   Pulse Pulse 81   Respirations Respirations 20   Systolic BP Systolic BP 161   Diastolic BP (mmHg) Diastolic BP (mmHg) 84   Mean BP 106   Pulse Ox % Pulse Ox % 94   Pulse Ox Activity Level  At rest   Oxygen Delivery Room Air/ 21 %   Brief Assessment:   Cardiac Regular    Respiratory clear BS    Gastrointestinal diffuse low abd tenderness   Lab Results: Hepatic:  16-Sep-13 06:31    Bilirubin, Total 0.5   Alkaline Phosphatase 75   SGPT (ALT)  9   SGOT (AST)  11   Total Protein, Serum  4.5   Albumin, Serum  2.2  Routine Chem:  16-Sep-13 06:31    Glucose, Serum  100   BUN  2   Creatinine (comp)  0.45   Sodium, Serum 142   Potassium, Serum 3.5   Chloride, Serum  110   CO2, Serum 21   Calcium (Total), Serum  7.6   Osmolality (calc) 279   eGFR (African American) >60   eGFR (Non-African American) >60 (eGFR values <71m/min/1.73 m2 may be an indication of chronic kidney disease (CKD). Calculated eGFR is useful in patients with stable renal function. The eGFR calculation will not be reliable in acutely ill patients when serum creatinine is changing rapidly. It is not useful in  patients on dialysis. The eGFR calculation may not be applicable to patients at the low and high extremes of body sizes, pregnant women, and vegetarians.)   Anion Gap 11  Routine Hem:  16-Sep-13 06:31    WBC (CBC) 7.9   RBC (CBC)  3.27   Hemoglobin (CBC)  10.1   Hematocrit (CBC)  30.2   Platelet Count (CBC) 282   MCV 92   MCH 30.8   MCHC 33.3   RDW  14.8   Neutrophil % 51.4   Lymphocyte % 28.6   Monocyte % 16.0   Eosinophil % 3.3   Basophil % 0.7   Neutrophil # 4.0   Lymphocyte # 2.3   Monocyte #  1.3    Eosinophil # 0.3   Basophil # 0.1 (Result(s) reported on 28 Mar 2012 at 07:03AM.)   Assessment/Plan:  Assessment/Plan:   Assessment Severe c.diff colitis. Gradually improving.    Plan Will need at least 14 day course of flagyl. [facility]ually advance diet as tolerated. Will check back on Wed if patient still here. Thanks.   Electronic Signatures: OVerdie Shire(MD)  (Signed 16-Sep-13 16:04)  Authored: Chief Complaint, VITAL SIGNS/ANCILLARY NOTES, Brief Assessment, Lab Results, Assessment/Plan   Last Updated: 16-Sep-13 16:04 by OVerdie Shire(MD)

## 2014-10-30 NOTE — Consult Note (Signed)
Chief Complaint:   Subjective/Chief Complaint Tolerating liquids. Still with abdominal pain. 2-3 BM's today.   VITAL SIGNS/ANCILLARY NOTES: **Vital Signs.:   15-Sep-13 04:41   Vital Signs Type Q 4hr   Temperature Temperature (F) 98.6   Celsius 37   Temperature Source oral   Pulse Pulse 87   Respirations Respirations 20   Systolic BP Systolic BP 594   Diastolic BP (mmHg) Diastolic BP (mmHg) 77   Mean BP 96   Pulse Ox % Pulse Ox % 95   Pulse Ox Activity Level  At rest   Oxygen Delivery Room Air/ 21 %   Lab Results: Routine Chem:  15-Sep-13 05:30    Glucose, Serum 87   BUN 7   Creatinine (comp)  0.55   Sodium, Serum 144   Potassium, Serum  3.0   Chloride, Serum  113   CO2, Serum 22   Calcium (Total), Serum  7.0   Anion Gap 9   Osmolality (calc) 284   eGFR (African American) >60   eGFR (Non-African American) >60 (eGFR values <43m/min/1.73 m2 may be an indication of chronic kidney disease (CKD). Calculated eGFR is useful in patients with stable renal function. The eGFR calculation will not be reliable in acutely ill patients when serum creatinine is changing rapidly. It is not useful in  patients on dialysis. The eGFR calculation may not be applicable to patients at the low and high extremes of body sizes, pregnant women, and vegetarians.)   Result Comment calcium, total - RESULTS VERIFIED BY REPEAT TESTING.  - NOTIFIED OF CRITICAL VALUE  - nyo to jane hodge/0752/03-27-12  - READ-BACK PROCESS PERFORMED.  Result(s) reported on 27 Mar 2012 at 07:08AM.  Routine Hem:  15-Sep-13 05:30    WBC (CBC) 8.1   RBC (CBC)  3.08   Hemoglobin (CBC)  9.7   Hematocrit (CBC)  28.4   Platelet Count (CBC) 333   MCV 92   MCH 31.3   MCHC 34.0   RDW  14.8   Neutrophil % 55.9   Lymphocyte % 29.9   Monocyte % 11.7   Eosinophil % 1.8   Basophil % 0.7   Neutrophil # 4.5   Lymphocyte # 2.4   Monocyte # 0.9   Eosinophil # 0.1   Basophil # 0.1 (Result(s) reported on 27 Mar 2012 at  07:08AM.)   Assessment/Plan:  Assessment/Plan:   Assessment C. diff colitis. Clinically better. Tolerating liquids.    Plan Agree with switching to PO Flagyl. Dr. OCandace Cruisewill resume care in am.   Electronic Signatures: IJill Side(MD)  (Signed 15-Sep-13 12:17)  Authored: Chief Complaint, VITAL SIGNS/ANCILLARY NOTES, Lab Results, Assessment/Plan   Last Updated: 15-Sep-13 12:17 by IJill Side(MD)

## 2014-10-30 NOTE — Consult Note (Signed)
Colonoscopy showed extensive colitis involvinb distal TC to descending colon, consistent with ischemic colitis. Continue Abx for now. Liquid diet. Hopefully, colitis will resolve on own over time.   Electronic Signatures: Verdie Shire (MD)  (Signed on 29-Jul-13 12:16)  Authored  Last Updated: 29-Jul-13 12:16 by Verdie Shire (MD)

## 2014-10-30 NOTE — Consult Note (Signed)
PATIENT NAME:  Alison Walker, Alison Walker MR#:  [id number] DATE OF BIRTH:  12-Jun-1961  DATE OF CONSULTATION:  02/07/2012  REFERRING PHYSICIAN:   CONSULTING PHYSICIAN:  Lupita Dawn. Trina Asch, MD  REASON FOR REFERRAL: Abdominal pain, diarrhea, abnormal CT scan.   DESCRIPTION: The patient is a 54 year old white female with rather chronic abdominal pain with recurrent diverticulosis requiring partial colectomy in the past. She also was admitted recently for questionable ischemic colitis who was supposed to have an outpatient colonoscopy scheduled at some point. She comes back with recurrent abdominal pain associated with diarrhea. In the Emergency Room, the patient was noted to have leukocytosis and a CT scan showed diffuse inflammation consistent with colitis.   She thought she had a colonoscopy done at [facility] about a year ago but she could not remember the results. She did have a C-difficile set which came back negative for C. difficile.   PAST MEDICAL HISTORY:  1. History of recurrent diverticulitis with surgery in 1998.  2. Hiatal hernia. She has had at least two abdominal hernias that are being evaluated for possible surgery but cannot be done because of recent issues.  3. Reflux. 4. Chronic obstructive pulmonary disease.   PAST SURGICAL HISTORY:  1. Hysterectomy. 2. Cholecystectomy. 3. Back surgery. 4. Hernia repair. 5. Partial colectomy.   MEDICATIONS: 1. Zofran. 2. Percocet. 3. Prilosec. 4. Flexeril. 5. Zestril.  6. Aspirin. 7. Align.  8. Atenolol. 9. Citrucel. 10. Colace.   ALLERGIES: She is allergic to morphine.   SOCIAL HISTORY: She still smokes but denies alcohol.   FAMILY HISTORY: Positive for stroke.   REVIEW OF SYSTEMS: Please refer to the initial dictation done on 07/27. There really has not been any changes in terms of the review of symptoms.   PHYSICAL EXAMINATION:  GENERAL: The patient is in no acute distress but she does complain of chronic pain.   VITAL SIGNS: She was afebrile.  Vital signs are stable.   HEENT: Normocephalic, atraumatic head. Pupils are equally reactive. Throat was clear.   NECK: Supple.   CARDIAC: Regular rhythm and rate without murmurs.   LUNGS: Clear bilaterally.   ABDOMEN: Normoactive bowel sounds, soft. It was diffusely tender, but it was worse in the lower abdomen. There is no rebound or guarding at this time. There is no hepatomegaly.   EXTREMITIES: No clubbing, cyanosis, or edema.   SKIN: Negative.   NEUROLOGICAL: Nonfocal.   LABORATORY, DIAGNOSTIC, AND RADIOLOGICAL DATA: Sodium 138, potassium 4.3, chloride 105, CO2 24, BUN 55, glucose 1.59. Liver enzymes are normal. Troponin level is normal. White count was 20.6 yesterday. It is 12.4 today. Hemoglobin 12.8. MCV is normal. Platelet count is normal. Urine is negative. C. difficile negative.   CT scan: There is evidence of ventral hernia. There was thickening of the bowel wall in the transverse and descending colon.   IMPRESSION/RECOMMENDATIONS: This is a patient with recurrent abdominal pain with diarrhea. Possibilities include either infectious colitis although C. difficile was negative versus ischemic colitis. We will plan on doing a colonoscopy tomorrow to evaluate the colon and take biopsies. Because of the hernias and prior surgery, it may be difficult to do. The patient understands the risks and potential complications.   Thank you for the referral.   ____________________________ Lupita Dawn. Candace Cruise, MD pyo:ap D: 02/08/2012 11:47:00 ET T: 02/08/2012 13:36:20 ET JOB#: [id number]  cc: Lupita Dawn. Candace Cruise, MD, <Dictator> Lupita Dawn Ann Bohne MD ELECTRONICALLY SIGNED 02/08/2012 13:53

## 2014-10-30 NOTE — Consult Note (Signed)
Chief Complaint:   Subjective/Chief Complaint Abd pain less and diarrhea less. Bx c/w ischemic colitis.   VITAL SIGNS/ANCILLARY NOTES: **Vital Signs.:   31-Jul-13 12:07   Vital Signs Type Routine   Temperature Temperature (F) 97.6   Celsius 36.4   Temperature Source AdultAxillary   Pulse Pulse 70   Respirations Respirations 18   Systolic BP Systolic BP 022   Diastolic BP (mmHg) Diastolic BP (mmHg) 88   Mean BP 104   Pulse Ox % Pulse Ox % 98   Pulse Ox Activity Level  At rest   Oxygen Delivery Room Air/ 21 %   Brief Assessment:   Cardiac Regular    Respiratory clear BS    Gastrointestinal mild lower abd tenderness   Lab Results: Routine Chem:  31-Jul-13 03:29    Glucose, Serum 87   BUN  4   Creatinine (comp)  0.50   Sodium, Serum 143   Potassium, Serum  3.0   Chloride, Serum  109   CO2, Serum 26   Calcium (Total), Serum  7.8   Anion Gap 8   Osmolality (calc) 281   eGFR (African American) >60   eGFR (Non-African American) >60 (eGFR values <80m/min/1.73 m2 may be an indication of chronic kidney disease (CKD). Calculated eGFR is useful in patients with stable renal function. The eGFR calculation will not be reliable in acutely ill patients when serum creatinine is changing rapidly. It is not useful in  patients on dialysis. The eGFR calculation may not be applicable to patients at the low and high extremes of body sizes, pregnant women, and vegetarians.)   Magnesium, Serum 1.8 (1.8-2.4 THERAPEUTIC RANGE: 4-7 mg/dL TOXIC: > 10 mg/dL  -----------------------)  Routine Hem:  31-Jul-13 03:29    WBC (CBC) 7.8   RBC (CBC)  3.22   Hemoglobin (CBC)  10.4   Hematocrit (CBC)  29.8   Platelet Count (CBC) 242   MCV 93   MCH 32.3   MCHC 34.9   RDW 14.4   Neutrophil % 61.4   Lymphocyte % 26.4   Monocyte % 10.0   Eosinophil % 1.6   Basophil % 0.6   Neutrophil # 4.8   Lymphocyte # 2.1   Monocyte # 0.8   Eosinophil # 0.1   Basophil # 0.0 (Result(s) reported on 10 Feb 2012 at 04:33AM.)   Assessment/Plan:  Assessment/Plan:   Assessment Ischemic colitis. Hopefully, colitis will resolve on own over time.    Plan If does not improve, then recommend vascular surgery referral later to evaluate blood supply to colon. Colon resection an option if ischemic colitis keeps recurring. Possible discharge in AM. Will sign off. Thanks.   Electronic Signatures: OVerdie Shire(MD)  (Signed 31-Jul-13 16:10)  Authored: Chief Complaint, VITAL SIGNS/ANCILLARY NOTES, Brief Assessment, Lab Results, Assessment/Plan   Last Updated: 31-Jul-13 16:10 by OVerdie Shire(MD)

## 2014-10-30 NOTE — Discharge Summary (Signed)
PATIENT NAME:  Alison Walker, Alison Walker MR#:  [id number] DATE OF BIRTH:  1960/09/18  DATE OF ADMISSION:  02/06/2012 DATE OF DISCHARGE:  02/11/2012  DISCHARGE DIAGNOSES:  1. Abdominal pain secondary to ischemic colitis. The patient's pain and nausea are improving, abdominal pain is resolving.  2. Acute renal failure due to dehydration, resolved.  3. Hypertension.  4. Chronic obstructive pulmonary disease.  5. Gastroesophageal reflux disease. 6. Abdominal pain, chronic, secondary to multiple abdominal surgeries.  7. Anxiety.  DISCHARGE MEDICATIONS:  1. Atenolol 50 mg p.o. daily.  2. Lisinopril 10 mg p.o. daily.  3. Align 4 mg daily.  4. Colace 100 mg p.o. daily.  5. Citracal 500 mg 2 tablets daily as needed for constipation. 6. Omeprazole 40 mg p.o. daily.  7. Zofran 4 mg every eight hours p.r.n. for nausea.  8. Aspirin 81 mg daily.  9. Flexeril 10 mg p.o. three times daily. 10. Acetaminophen with oxycodone 325/5 mg 1 tablet every four hours as needed for abdominal pain.  11. She also requested Xanax tablets, 0.25 mg, every eight hours p.r.n. for anxiety.   DIET: Low sodium diet.   DISCHARGE FOLLOWUP: The patient needs to followup with [facility]. She does have an appointment on 02/12/2012, Friday. She is also advised to followup with gastroenterologist, Dr. Candace Cruise, if she needs to, and also with primary doctor in 1 to 2 weeks.   CONSULTATIONS:  1. Loistine Simas, MD - Gastroenterology. 2. Sherri Rad, MD - Surgery.  HOSPITAL COURSE: This is a 54 year old female with history of multiple abdominal surgeries admitted for abdominal pain and nausea and diarrhea. She has history of diverticulitis with partial colectomy, status post colostomy and then reversal. The patient has history of probable ischemic colitis and also history of abdominal hernia repair. She came in because of abdominal pain and diarrhea. Look at the history and physical for full details.  1. The patient was admitted for  abdominal pain with diarrhea and CT scan on admission showed diffuse inflammation consistent with colitis. The patient was admitted to the hospitalist service for abdominal pain, possible ischemic colitis, and started on IV fluids and IV antibiotics. Surgical consult and gastroenterology consultations were obtained. Dr. Marina Gravel saw the patient and recommended no surgical intervention needed. Dr. Candace Cruise from gastroenterology also saw the patient and did a colonoscopy on 02/08/2012. The patient's colonoscopy showed diverticulosis of the sigmoid colon and also inflamed and erythematous mucosa in the descending colon, total transverse colon. Pathology results showed ischemic colitis. The patient was continued on clear liquids and IV fluids and pain medications were continued. Stool for Clostridium difficile was negative. The patient's symptoms gradually started to get better and she was tolerating her diet. Diarrhea resolved. The patient was started on a low sodium diet. The patient preferred to go home on 08/01 as she has an appointment with [facility] on 02/12/2012. So we discharged her. Abdominal pain is much better than when she came in. Her white count also has normalized; white count was 12.4 on 02/07/2012. The patient's blood cultures have been negative, stool for C. difficile has been negative.  2. Acute renal failure. When she came BUN was 55 and creatinine 1.59. She was started on IV fluids. The patient's BUN and creatinine have normalized; BUN is 22 and creatinine 0.64 on 02/08/2012. The patient's urine output remained stable and had no further issues. 3. Hypokalemia, secondary to diarrhea. That was replaced.  4. Hypertension. She was continued on her medications. Refer to the discharge medications. Blood pressure  stayed around 130/80 and 140/80 and heart rate was 73. The patient did remain afebrile.  5. Chronic pain secondary to multiple abdominal surgeries. The patient was on chronic  acetaminophen/hydrocodone combination, so I gave her a prescription for only 20 tablets.         6. The patient did receive Mefoxin and Flagyl.  TIME SPENT ON DISCHARGE PREPARATION: More than 30 minutes. ____________________________ Epifanio Lesches, MD sk:slb D: 02/14/2012 18:21:05 ET T: 02/15/2012 11:59:59 ET JOB#: [id number]  cc: Epifanio Lesches, MD, <Dictator> Epifanio Lesches MD ELECTRONICALLY SIGNED 02/17/2012 20:23

## 2014-10-30 NOTE — H&P (Signed)
PATIENT NAME:  Alison Walker, Alison Walker MR#:  [id number] DATE OF BIRTH:  29-Sep-1960  DATE OF ADMISSION:  03/23/2012  PRIMARY CARE PHYSICIAN: None.   REQUESTING PHYSICIAN: Dr. Michel Santee   CHIEF COMPLAINT: Abdominal pain, nausea, vomiting, diarrhea.   HISTORY OF PRESENT ILLNESS: The patient is a 54 year old female with a known history of hypertension, ischemic colitis, and COPD who is being admitted for severe acute abdominal pain, nausea, vomiting, and diarrhea. She started having symptoms last night, could not sleep all night. She had about 2 to 3 episodes of vomiting all night with the first one with mainly food particles followed by dark color vomiting, likely blood as per the patient. She also started having diarrhea which she had about 2 to 3 bowel movements. All of them were dark coffee colored. Her pain continued about 10 out of 10 in severity. Finally this morning she came to the Emergency Department. While in the ED, she had four more bowel movements. All of them were bloody (dark color) and she is being admitted for further evaluation and management. Her CT scan showed air-filled appendix with no inflammation around it, possible constipation, entire colon filled with stool and air, although obstruction cannot be excluded, thickening around distal esophageal wall. Her lactic acid was elevated at 4.2 and she is being admitted for further evaluation and management.   While in the ED, she was hypotensive with blood pressure in the 70's and tachycardia with heart rate anywhere from high 90's to 100.   PAST MEDICAL HISTORY:  1. Recurrent diverticulitis.  2. History of ischemic colitis.  3. Hiatal hernia.  4. Gastroesophageal reflux disease.  5. Chronic obstructive pulmonary disease.  6. 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   PAST SURGICAL HISTORY:  1. Hysterectomy. 2. Cholecystectomy.  3. Back surgery.  4. Abdominal hernia repair in 2005.   ALLERGIES: Cipro and morphine.   SOCIAL HISTORY: Not  smoking, on nicotine patch 21 mg daily. No alcohol use.   FAMILY HISTORY: Brother with heart attack at early age.   MEDICATIONS AT HOME:  1. Aspirin 81 mg p.o. daily.  2. Atenolol 50 mg p.o. daily.  3. Citrucel 500 mg p.o. every other day.  4. Colace 100 mg p.o. daily.  5. Lisinopril 10 mg p.o. daily.  6. Omeprazole 40 mg p.o. daily.   REVIEW OF SYSTEMS: CONSTITUTIONAL: No fever, fatigue, weakness. EYES: No blurred or double vision. ENT: No tinnitus or ear pain. RESPIRATORY: No cough, wheezing, hemoptysis. CARDIOVASCULAR: No chest pain, orthopnea, edema. GI: Positive for nausea, vomiting, diarrhea, also abdominal pain, possible hematochezia and hematemesis as described by the patient. GU: No dysuria or hematuria. ENDOCRINE: No polyuria or nocturia. HEMATOLOGY: No anemia or easy bruising. SKIN: No rash or lesion. MUSCULOSKELETAL: Back pain and overall chronic pain. NEUROLOGICAL: No tingling, numbness, weakness. PSYCHIATRY: No history of anxiety or depression.   PHYSICAL EXAMINATION:   VITAL SIGNS: Temperature 98, heart rate anywhere from 97 to 102, respirations 20 per minute, blood pressure was 70/54 while in the ER, after some fluid given in the Emergency Department it is up to 94/55. She is saturating 97% on room air.   GENERAL: The patient is a 54 year old female quite toxic looking sitting in the bed in acute distress.   EYES: Pupils equal, round, reactive to light and accommodation. No scleral icterus. Extraocular muscles intact.   HEENT: Head atraumatic, normocephalic. Oropharynx and nasopharynx clear.   NECK: Supple. No jugular venous distention. No thyromegaly. No tenderness.   LUNGS: Clear to auscultation  bilaterally. No wheezing, rales, rhonchi, or crepitation.   CARDIOVASCULAR: S1, S2 normal, tachycardic. No murmurs, rubs, or gallops.   ABDOMEN: Soft, nondistended. She has tenderness throughout her abdomen more in the lower quadrant. No specific point tenderness. No guarding  or rigidity. No organomegaly appreciated.   NEUROLOGIC: Cranial nerves III to XII intact. Muscle strength 5/5. Extremity sensation intact. Difficult evaluation as she is in a lot of pain. Still rates her pain as 9 out of 10 in severity.   SKIN: No obvious rash, lesion, or ulcer. She does have multiple surgical scars well healed.   EXTREMITIES: No pedal edema, cyanosis, or clubbing.   PSYCH: The patient is oriented to time, place, and person x3. She seems quite anxious at this time due to pain.   LABORATORY, DIAGNOSTIC, AND RADIOLOGICAL DATA: Sodium 134, potassium 5.1, chloride 102, CO2 18, BUN 42, creatinine 2.28. Serum magnesium 3.1. Liver function tests alkaline phosphatase 316, AST 38, ALT 27. Negative first set of cardiac enzymes. CBC showed white count of 16.0, hemoglobin 16.3, hematocrit 51.1, platelets 672. PTT of 28.6. Urinalysis showed trace bacteria, 2 WBCs, trace leukocyte esterase. Lactic acid of 4.2.   Chest x-ray while in the ED showed no acute cardiopulmonary disease. NG tube may lie at or about the GE junction.   CT scan of the abdomen and pelvis without contrast in the ED showed entire colon moderately distended with stool and air. Focal narrowing at the sigmoid suture line. This all could be secondary to constipation although some degree of obstruction cannot be excluded. Appendix air-filled with no adjacent inflammatory changes. Cannot rule out appendicitis. Mild wall thickening of the distal esophagus likely secondary to underdistention.   EKG showed no major ST-T changes.    IMPRESSION AND PLAN:  1. Suspected ischemic colitis/dead gut based on acuity of her abdominal symptoms with toxic appearance along with elevated lactic acid and her recent past history of similar nature. I have discussed the case with Surgery, Dr. Rexene Edison, and also discussed with Dr. Candace Cruise from GI and stat consult has been requested. They have agreed to see the patient as soon as possible. For now will  cover her with IV Tygacil for empiric antibiotic coverage for any underlying abdominal cavity infection. Will start on aggressive IV fluids and close monitoring with off unit telemetry.  2. Acute renal failure, likely ATN. Will place Foley. Start on aggressive hydration. Consult Nephrology. Avoid any nephrotoxins at this time.  3. Possible sepsis with hypotension, tachycardia, tachypnea, leukocytosis, diaphoresis likely due to intraabdominal infection. Will cover with Tygacil empiric antibiotic coverage. Hold all blood pressure medication.  4. Hematemesis/hematochezia. Start her on Protonix drip. Consult GI and surgical specialty stat. Have discussed the case with both of the specialties and they will see her tonight.   CODE STATUS: FULL CODE.   TOTAL TIME TAKING CARE OF THIS PATIENT (CRITICAL CARE): 55 minutes.   If her blood pressure continues to drop, she may need to be transferred to ICU for vasopressors. She remains very high risk for cardiorespiratory arrest including possible death.   ____________________________ Lucina Mellow. Manuella Ghazi, MD vss:drc D: 03/23/2012 18:58:29 ET T: 03/24/2012 07:39:58 ET JOB#: [id number]  cc: Amiree No S. Manuella Ghazi, MD, <Dictator> Lucina Mellow [facility] MD ELECTRONICALLY SIGNED 03/25/2012 18:39

## 2014-10-30 NOTE — Consult Note (Signed)
PATIENT NAME:  Alison Walker, Alison Walker MR#:  [id number] DATE OF BIRTH:  Oct 30, 1960  DATE OF CONSULTATION:  02/07/2012  CONSULTING PHYSICIAN:  Elta Guadeloupe A. Marina Gravel, MD  REASON FOR CONSULTATION: Abdominal pain and colitis.   HISTORY: This is a 54 year old white female with a complex past surgical history dating back at least 15 years, at which point she had a Hartmann's procedure for diverticular perforation followed by subsequent reversal. The patient has had multiple abdominal operations including cesarean section, hysterectomy, sequential unilateral oophorectomies, ventral hernia repair with mesh, followed by removal of the mesh, and repair of an enterocutaneous fistula in 2010 by Dr. Rochel Brome. The patient was recently admitted to the hospital in June of this year with presumed diverticulitis versus ischemic colitis. Surgical Services at that point were not consulted. The patient was discharged home on oral antibiotics. She had inflammatory changes seen within her sigmoid colon at that time. She has a history of chronic abdominal pain and chronic ventral hernia which she describes as a daily pain in the right lower quadrant. She does not have chronic diarrhea. The patient was discharged at that time on oral Cipro and Flagyl with a follow up at [facility] for a repeat colonoscopy. The patient was in her usual state of poor health with chronic abdominal pain up until the night before last when she awoke with profuse nausea, vomiting, and new onset diarrhea. She has had no sick contacts. No jaundice. No fevers. No recent travel. No recent antibiotics. The patient was seen in the Emergency Room at which point she was having found to have an elevated white count and significant abdominal pain. An oral only contrasted CT scan was obtained due to elevated creatinine and acute renal failure secondary to dehydration which demonstrated findings consistent with a ventral hernia as well as transverse colon thickening. No signs  of bowel obstruction. No free air. No pneumatosis intestinalis. The gallbladder is surgically absent from an open cholecystectomy a number of years ago. The patient was admitted to the Medical Service. Surgical Services were asked to evaluate.   ALLERGIES: Cipro and morphine.   HOME MEDICATIONS:  1. Align 4 mg by mouth once a day. 2. Aspirin 81 mg by mouth once a day. 3. Atenolol 50 mg by mouth once a day. 4. Citrucel 500 mg by mouth b.i.d.  5. Colace 100 mg by mouth once a day. 6. Flexeril 10 mg by mouth t.i.d.  7. Lisinopril 10 mg by mouth once a day.  8. Omeprazole 40 mg once a day. 9. Percocet 5/325, 1 to 2 tabs every 4 hours as needed.  10. Zofran orally-dissolving tablet 4 mg by mouth every 6 hours as needed for nausea.    PAST MEDICAL HISTORY: Significant for: 1. Obesity.  2. Multiple abdominal operations.  3. History of diverticulitis, possible ischemic colitis or recurrent diverticulitis back in June of this year.  4. History of hiatal hernia.  5. Gastroesophageal reflux disease.  6. History of pneumonia. 7. Chronic obstructive pulmonary disease.  8. History of WPW syndrome. 9. History of hysterectomy.  10. Cholecystectomy. 11. Back surgery. 12. Abdominal hernia surgery. 13. Infected mesh. 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   FAMILY HISTORY: Noncontributory.   REVIEW OF SYSTEMS: Review of systems is diffusely positive for chronic abdominal pain.   PHYSICAL EXAMINATION:  GENERAL: The patient is in no obvious distress. She is obese, short stature, BMI 31.1, weight 170 pounds.   VITAL SIGNS: Temperature is 99, pulse 89, respiratory rate 20, blood pressure 101/58,  room air saturation 94%.   LUNGS: Clear.   HEART: Regular rate and rhythm.   ABDOMEN: Abdomen demonstrates multiple scars in right upper quadrant, left lower quadrant midline. There is no discrete tenderness. There is no distention. The patient had difficulty standing, so hernia examination was incomplete.    EXTREMITIES: Warm and well perfused.   RECTAL/GENITOURINARY: Examination was deferred, however, as per the ER is guaiac-negative.   NEUROLOGIC/PSYCHIATRIC: Examination is unremarkable.   LABORATORY VALUES: As of this morning, glucose 123, BUN 55, creatinine 1.59, sodium 138, potassium 4.3, chloride 105, calcium 8.0. Liver function tests: Albumin 2.7, liver function tests are normal. White count is 12.4, down from 20.6 on admission. Hemoglobin 12.8, hematocrit 38.2, platelet count 296,000. Clostridium difficile toxin was negative. Urinalysis was negative. Lactic acid was 1.6 on admission.   IMPRESSION/ RECOMMENDATIONS: A 54 year old white female with a history of chronic right lower quadrant abdominal pain of unclear etiology, multiple ventral hernia repairs, history of enterocutaneous fistula, history of diverticulitis requiring diverting colostomy and subsequent reversal, with what appears to be transverse colon thickening seen on a CT scan. This certainly could be of infectious or regional ischemic etiology.   At present, there is no indication for surgical intervention. If the patient does require surgical intervention based on colonoscopic findings or worsening symptomatology of chronic pain, I would recommend a tertiary care referral.   TOTAL TIME SPENT: 45 minutes.    ____________________________ Jeannette How Marina Gravel, MD mab:cbb D: 02/07/2012 10:01:36 ET T: 02/07/2012 12:03:01 ET JOB#: [id number]  cc: Elta Guadeloupe A. Marina Gravel, MD, <Dictator> Franchot Pollitt A Ailed Defibaugh MD ELECTRONICALLY SIGNED 02/08/2012 8:06

## 2014-11-02 NOTE — Consult Note (Signed)
PATIENT NAME:  Alison Walker, Alison Walker MR#:  [id number] DATE OF BIRTH:  [birth year]  DATE OF CONSULTATION:  01/04/2013  REFERRING PHYSICIAN:  Dr. Waldron Labs. CONSULTING PHYSICIAN: Dr. Allen Norris.  NEUROLOGIST: Dr. Candace Cruise.   PRIMARY CARE PHYSICIAN: Not applicable.  REASON FOR CONSULTATION: Acute enterocolitis.   HISTORY OF PRESENT ILLNESS: Alison Walker is a 54 year old Caucasian female, who was in her usual state of health until yesterday. She tells me around suppertime she began to have abdominal cramps and spasms. She then began to have nausea and vomiting with too numerous to count episodes. She rates the pain all over her stomach 10 out of 10 on a pain scale. She then developed acute profuse diarrhea. She then had a syncopal episode and was transported via EMS to the hospital. She reports she did have some ill contacts with people with pneumonia and flu; however, she has not had contact with anybody with acute diarrheal illness. 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 She has a history of a ventral hernia and has been seen by [facility], Dr. Marina Gravel and Dr. Rexene Edison. She was referred to [facility], but has not followed up with this appointment. Her white blood cell count was 11.5. She has rare heartburn and indigestion if she takes her daily omeprazole 40 mg. She denies any dysphagia, odynophagia or chronic abdominal pain. CT scan showed severe bowel wall thickening of the gastric antrum, descending and sigmoid colitis, prior sigmoidectomy with colorectal anastomosis and ventral hernia with loops of small bowel suggestive of inflammatory versus infectious versus ischemia.   She does have history of C. difficile colitis in September 2013. However, she had a negative C. diff. PCR 01/03/2013. Her last colonoscopy was by Dr. Candace Cruise on 08/18/2012 and she had a poor prep, but an otherwise normal colonoscopy with normal biopsies and melanosis coli. She had a  poor prep and incomplete colonoscopy the day prior to this. On 05/12/2012, she has EGD, which showed LA grade A reflux esophagitis and retained food contents. On 03/24/2012, she had a flexible sigmoidoscopy for severe ischemic colitis with diffuse inflammation in the sigmoid and descending colon. Her urine drug screen was positive for cannabinoid, methadone and benzodiazepines.   PAST MEDICAL/SURGICAL HISTORY: C. diff. colitis in 03/2012, ischemic colitis in 03/2012 (see GI workup as above), recurrent diverticulitis requiring partial colectomy in 1998, COPD, coronary artery disease, status post MI, GERD, ventral hernia, hysterectomy, cholecystectomy, back surgery and hernia repair.   MEDICATIONS PRIOR TO ADMISSION: Aspirin 81 mg daily, atenolol 50 mg daily, Colace 100 mg t.i.d., Flexeril 10 mg t.i.d., lisinopril 10 mg daily, MiraLax 17 grams daily as needed, omeprazole 40 mg daily and Xanax 0.5 mg t.i.d.   ALLERGIES: CIPRO CAUSES HIVES. MORPHINE CAUSES HIVES.   FAMILY HISTORY: There is no known family history of ulcerative colitis, Crohn's disease or colorectal carcinoma. She does have a brother with hepatitis C.   SOCIAL HISTORY: She lives with a friend named Alison Walker. She has 3 healthy children. She does smoke cigarettes. 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 She denies any alcohol use.   REVIEW OF SYSTEMS: See HPI. CONSTITUTIONAL: She has fatigue, weakness, and malaise. Otherwise, negative complete review of systems.   PHYSICAL EXAMINATION:  VITAL SIGNS: Temperature 98.2, pulse 64, respirations 12, blood pressure 104/52, oxygen saturation 94% on room air.  GENERAL: She is a well-developed, well-nourished, Caucasian female in no acute distress.  HEENT: Sclerae clear and anicteric.  Conjunctivae pink. Oropharynx pink and moist without any lesions.  NECK: Supple without mass or thyromegaly. She does have am IV in the left neck.  CHEST: Regular rate and rhythm. Normal S1, S2  without any murmurs, clicks, rubs or gallops.  LUNGS: Clear to auscultation bilaterally.  ABDOMEN: Positive bowel sounds x4. She does have a large ventral midline hernia at the site of previous scar. Entire abdomen is tender without rigidity, rebound or guarding. She has multiple surgical scars. Hernia is easily reducible. No hepatosplenomegaly, but exam is limiting due to the patient's tenderness.  EXTREMITIES: Without edema or clubbing.  SKIN: Pink, warm and dry without any rash or jaundice.  PSYCHIATRIC: She is alert, oriented with normal mood and affect.  NEUROLOGIC: Grossly intact.   LABORATORY STUDIES: Glucose 143, creatinine 1.49. LFTs normal. Negative troponin. Potassium 3.3, otherwise normal met-7. Magnesium 2.6, lipase 160, INR 1.   IMAGING: She had a chest x-ray, which was negative. See HPI for CT scan.   IMPRESSION: Alison Walker is a pleasant 54 year old female admitted with a syncopal episode after acute abdominal pain, nausea, vomiting and diarrhea. Her CT suggests gastric thickening and descending sigmoid colon thickening with a ventral hernia containing loops of small bowel. She has been evaluated by surgery here and was referred [facility] for potential ventral hernia repair; however, she did not follow through with this. She admits to illicit drug use including taking someone else's methadone and using marijuana prior to the onset of her symptoms. She has a history of Clostridium difficile colitis in 03/2012, but her Clostridium difficile was negative this admission. She also has history of ischemic colitis on flexible sigmoidoscopy in 03/2012 prior to episode of Clostridium difficile. Since that time, she has had a normal colonoscopy with Dr. Candace Cruise in 08/2012 with a poor preparation. She has also had a history of partial colectomy in 199 for recurrent diverticulitis. Last esophagogastroduodenoscopy showed LA grade A reflux esophagitis and retained food contents.   She has acute enterocolitis  likely ischemic versus infectious with hypotension, now on dopamine drip and antibiotics including vancomycin, meropenem and Levaquin for sepsis. I have discussed her care with Dr. Allen Norris and our plan of care is below. There is no indication for endoscopy at this time.   PLAN:  1.  Advised no illicit drug use.  2.  Agree with IV fluids, supportive measures, sepsis protocol and antibiotic coverage.  3.  Followup stool culture.  4.  Check Giardia and E. coli. I contacted the lab and that should be included in stool culture.  5.  Check lactic acid. 6.  CBC in the morning.  7.  Daily KUB and if evidence of ischemic ventral hernia, she would need urgent surgical intervention.  8.  Agree with PPI.   We would like to thank you for allowing [place] to participate in the care of Alison Walker.  ____________________________ Andria Meuse, NP klj:aw D: 01/04/2013 11:18:47 ET T: 01/04/2013 12:54:05 ET JOB#: [id number]  cc: Andria Meuse, NP, <Dictator> [place] FNP ELECTRONICALLY SIGNED 01/05/2013 8:51

## 2014-11-02 NOTE — Consult Note (Signed)
Brief Consult Note: Diagnosis: colitis.   Patient was seen by consultant.   Consult note dictated.   Recommend further assessment or treatment.   Discussed with Attending MD.   Comments: prob recurrent C diff. colitis. Inconsequential VH, reducible and nontender. Will follow.  Electronic Signatures: Florene Glen (MD)  (Signed 25-Jun-14 00:48)  Authored: Brief Consult Note   Last Updated: 25-Jun-14 00:48 by Florene Glen (MD)

## 2014-11-02 NOTE — Consult Note (Signed)
Brief Consult Note: Diagnosis: Enterocolitis.   Patient was seen by consultant.   Consult note dictated.   Comments: Alison Walker is a pleasant 54 y/o female admitted with syncopal episode after acute abdominal pain, nausea, vomiting & diarrhea.  CT suggests gastric thickening & descending/sigmoid colon thickening with a ventral hernia containg loops of small bowel.  She has been evaluated by surgery here & was referred to [facility] for potential ventral hernia repair, however she did not follow through with this.  She admits to illicit drug use including taking someone else's methadone & using marjuana prior to the onset of her symptoms.  She has hx of c diff colitis in September 2013, but c diff was negative this admission.  She also has hx of ischemic colitis on flexible sigmoidoscopy in September 2013 prior to episode of c diff.  Since that time, she had a normal colonoscopy with Dr Candace Cruise Feb 2014 with a poor prep.  She also has hx of partial colectomy in 1998 for recurrent diverticulitis.  Last EGD showed LA Grade A reflux esophagitis & retained food contents.  She has acute enterocolitis likely ISCHEMIC VS. INFECTIOUS with hypotension, now on dopamine drip & antibiotics including vancomycin, meropenem & levaquin for sepsis.  I have discussed w/ Dr Allen Norris & our plan of care is below.  No indication for endoscopy at this time.  Plan: 1) Advised no illicit drug use 2) Agree w/ IVFs, supportive measures, sepsis protocol antibiotic coverage 3) FU stool culture 4) Check giardia & e coli  5) Lactic acid 6) CBC AM 7) Daily KUB 8) Agree w/ PPI  Thanks for consult.  Please see full dictated note. #[id number].  Electronic Signatures: Andria Meuse (NP)  (Signed 25-Jun-14 11:18)  Authored: Brief Consult Note   Last Updated: 25-Jun-14 11:18 by Andria Meuse (NP)

## 2014-11-02 NOTE — Consult Note (Signed)
Chief Complaint:  Subjective/Chief Complaint Covering for Dr.Wohl. Overall, abd pain better though not back to baseline. Nausea better. No signs of bleeding anywhere. Hgb up again. Feels anxious. Took BC powder several days ago when patient developed acute nausea/vomiting/diarrha/abd pain.   VITAL SIGNS/ANCILLARY NOTES: **Vital Signs.:   28-Jun-14 04:40  Vital Signs Type Q 4hr  Temperature Temperature (F) 98.4  Celsius 36.8  Temperature Source oral  Pulse Pulse 95  Respirations Respirations 18  Systolic BP Systolic BP 154  Diastolic BP (mmHg) Diastolic BP (mmHg) 83  Mean BP 105  Pulse Ox % Pulse Ox % 95  Pulse Ox Activity Level  At rest  Oxygen Delivery Room Air/ 21 %   Brief Assessment:  GEN no acute distress   Cardiac Regular   Respiratory clear BS   Gastrointestinal mild dffuse abd tenderness   Lab Results: Routine Chem:  28-Jun-14 05:17   Potassium, Serum 3.7 (Result(s) reported on 07 Jan 2013 at 05:37AM.)  Magnesium, Serum  1.6 (1.8-2.4 THERAPEUTIC RANGE: 4-7 mg/dL TOXIC: > 10 mg/dL  -----------------------)  Routine Hem:  28-Jun-14 05:17   WBC (CBC) 7.4  RBC (CBC)  3.18  Hemoglobin (CBC)  10.0  Hematocrit (CBC)  28.5  Platelet Count (CBC) 233  MCV 90  MCH 31.3  MCHC 35.0  RDW  15.3  Neutrophil % 66.1  Lymphocyte % 22.8  Monocyte % 7.5  Eosinophil % 2.6  Basophil % 1.0  Neutrophil # 4.9  Lymphocyte # 1.7  Monocyte # 0.6  Eosinophil # 0.2  Basophil # 0.1 (Result(s) reported on 07 Jan 2013 at 05:31AM.)   Assessment/Plan:  Assessment/Plan:  Assessment Gastritis? No bleeding. No need to schedule EGD urgently. Full liquid already ordered. Recurrent ischemic colitis? Requests xanax for anxiety.   Plan Will order xanax. Advance diet as tolerated. Continue protonix bid. Continue Abx. Recommend either CT angiography or mesenteric doppler u/s to evaluate the major arterial supply to GI system. thanks.   Electronic Signatures: Verdie Shire (MD)  (Signed  28-Jun-14 10:48)  Authored: Chief Complaint, VITAL SIGNS/ANCILLARY NOTES, Brief Assessment, Lab Results, Assessment/Plan   Last Updated: 28-Jun-14 10:48 by Verdie Shire (MD)

## 2014-11-02 NOTE — Consult Note (Signed)
Chief Complaint:  Subjective/Chief Complaint Pt notes 5/10 lower abdominal pain. Denies N/V.  No BM x 48hrs so stool culture was never collected.   VITAL SIGNS/ANCILLARY NOTES: **Vital Signs.:   27-Jun-14 10:55  Temperature Temperature (F) 98.2  Celsius 36.7  Temperature Source axillary  Pulse Pulse 86  Respirations Respirations 11  Systolic BP Systolic BP 235  Diastolic BP (mmHg) Diastolic BP (mmHg) 75  Mean BP 93  Pulse Ox % Pulse Ox % 100  Oxygen Delivery Room Air/ 21 %  Pulse Ox Heart Rate 76   Brief Assessment:  GEN well developed, well nourished, no acute distress, A/Ox3   Cardiac Regular  -- LE edema   Respiratory normal resp effort   Gastrointestinal details normal Soft  Nondistended  Bowel sounds normal  +TTP bilateral lower quads   Additional Physical Exam Skin: Pink, warm, dry   Lab Results:  Routine Chem:  27-Jun-14 03:18   Magnesium, Serum  1.6 (1.8-2.4 THERAPEUTIC RANGE: 4-7 mg/dL TOXIC: > 10 mg/dL  -----------------------)  Glucose, Serum 81  BUN  4  Creatinine (comp)  0.54  Sodium, Serum 140  Potassium, Serum  3.4  Chloride, Serum  109  CO2, Serum 27  Calcium (Total), Serum  7.8  Anion Gap  4  Osmolality (calc) 275  eGFR (African American) >60  eGFR (Non-African American) >60 (eGFR values <67m/min/1.73 m2 may be an indication of chronic kidney disease (CKD). Calculated eGFR is useful in patients with stable renal function. The eGFR calculation will not be reliable in acutely ill patients when serum creatinine is changing rapidly. It is not useful in  patients on dialysis. The eGFR calculation may not be applicable to patients at the low and high extremes of body sizes, pregnant women, and vegetarians.)  Routine Hem:  27-Jun-14 03:18   WBC (CBC) 8.6  RBC (CBC)  2.99  Hemoglobin (CBC)  9.2  Hematocrit (CBC)  26.7  Platelet Count (CBC) 207  MCV 90  MCH 30.9  MCHC 34.5  RDW  15.1  Neutrophil % 57.8  Lymphocyte % 31.2  Monocyte % 8.2   Eosinophil % 2.2  Basophil % 0.6  Neutrophil # 5.0  Lymphocyte # 2.7  Monocyte # 0.7  Eosinophil # 0.2  Basophil # 0.1 (Result(s) reported on 06 Jan 2013 at 03:37AM.)   Radiology Results: XRay:    26-Jun-14 10:02, KUB - Kidney Ureter Bladder  KUB - Kidney Ureter Bladder   REASON FOR EXAM:    ventral hernia, enterocolitis  COMMENTS:       PROCEDURE: DXR - DXR KIDNEY URETER BLADDER  - Jan 05 2013 10:02AM     RESULT: Comparison is made to the study of January 04, 2013 at 12:11 p.m.    There is a moderate amount of gas within the colon. There are small   amounts of small bowel gas in the left mid abdomen. There is a small   amount of gas in the rectum. There is a right femoral catheter in place   presumably in the femoral vein with the tip in the distal IVC or proximal   right common iliac vein. There are surgical clips in the gallbladder   fossa.    IMPRESSION:  The bowel gas pattern is relatively nonspecific and may     reflect a gastroenteritis type process or ileus. There is no evidence of   obstruction or perforation.     Dictation Site: 2        Verified By: DAVID A. JMartinique  M.D., MD   Assessment/Plan:  Assessment/Plan:  Assessment Acute enterocolitis:  Likely Ischemic.  Resolved.  Ventral hernia:  Stable.  Not acute surgical abdomen. Anemia:  Hgb dropped another gram down to 9.2.  Hx GERD & frequent headache powder use.  No evidence of gross GI bleeding.   Mild Electrolyte imbalance:  Improving.  Mag/K repletion per attending   Plan 1) FU [facility] Surgery for hernia 2) Will discuss pt's anemia with Dr Allen Norris to see if she needs EGD given her drop in hgb, inflammatory changes on CT & hx freq asa headache powders.  She did have clear liquids today at 8:30am. 3) Further recommendations to follow. 4) Increase Protonix to 15m BID IV Addendum: Discussed w/ Dr WAllen Norris  He will do EGD today at 14:30.  Risks, benefits, alternatives explained to pt to include, but not limited to  bleeding, infection, perforation or med reaction.  She agrees w/ plan.  Consent to be obtained, NPO.  HEPARIN SUSPENDED at this time.   Electronic Signatures: JAndria Meuse(NP)  (Signed 27-Jun-14 11:50)  Authored: Chief Complaint, VITAL SIGNS/ANCILLARY NOTES, Brief Assessment, Lab Results, Radiology Results, Assessment/Plan   Last Updated: 27-Jun-14 11:50 by JAndria Meuse(NP)

## 2014-11-02 NOTE — H&P (Signed)
PATIENT NAME:  Alison Walker, PARLIER MR#:  [id number] DATE OF BIRTH:  Feb 19, 1961  DATE OF ADMISSION:  12/01/2012  PRIMARY CARE PHYSICIAN:  None.   CHIEF COMPLAINT:  Generalized abdominal pain.   HISTORY OF PRESENT ILLNESS: Alison Walker is a 54 year old Caucasian female with history of diverticulosis, status post colostomy with reversal done in the past, history of GERD, COPD, and history of C. diff colitis in the past. Comes to the Emergency Room with history of abdominal pain, generalized for the last couple of days, got worse today. She had a small bowel movement today, without any blood. Denies any nausea, vomiting. She has been able to keep oral diet in. In the Emergency Room, CT of the abdomen with IV contrast was done, which showed mild inflammation around the ventral hernia without obstruction, and considerable amount of stool in the entire colon. The rest of the CT abdomen has some chronic changes; however, nothing acute has shown. The patient received some IV Dilaudid in the Emergency Room. She was tearful secondary to pain; hence, being admitted for abdominal pain suspected secondary to mild ventral hernia inflammation along with constipation. The patient denies any fever. Her white count is normal.   PAST MEDICAL HISTORY:  1.  C. diff colitis in September 2013. She had a normal colonoscopy thereafter as a followup. She followed up with Dr. Candace Cruise.  2.  Hypertension.  3.  GERD.  4.  History of ventral hernia, for which she follows up with [facility].  5.  History of Wolff-Parkinson-White syndrome.   PAST SURGICAL HISTORY: 1.  Colostomy secondary to history of diverticulosis.  2.  Partial hysterectomy.  3.  Cholecystectomy.  4.  Back surgery.  5.  Reversal of colostomy.  6.  D and C.  7.  C-section.   ALLERGIES:  CIPRO and MORPHINE.   MEDICATIONS: 1.  Acetaminophen/hydrocodone 325/5, 1 tablet 3 times a day as needed.  2.  Aspirin 81 mg daily.  3.  Atenolol 50 mg daily.  4.  Colace 100 mg p.o. 1  to 2, 3 times a day.  5.  Cyclobenzaprine 10 mg 1 tablet 3 times a day.  6.  Lisinopril 10 mg daily.  7.  MiraLAX daily.  8.  Omeprazole 40 mg daily.   SOCIAL HISTORY: Does not smoke. Denies alcohol use.   FAMILY HISTORY: Brother with heart attack.   REVIEW OF SYSTEMS: CONSTITUTIONAL: No fever, fatigue or weakness.  EYES:  No blurred or double vision.  EARS, NOSE, THROAT:  No tinnitus, ear pain, hearing loss or epistaxis.  RESPIRATORY:  No cough, wheeze, hemoptysis. No shortness of breath.  CARDIOVASCULAR:  No chest pain, orthopnea, edema. Positive for hypertension.  GASTROINTESTINAL: Positive for abdominal pain and constipation. Positive for GERD.  GENITOURINARY:  No dysuria or hematuria.  ENDOCRINE:  No polyuria, nocturia or thyroid problems.  HEMATOLOGY:  No anemia or easy bruising.  SKIN:  No acne or rash.  MUSCULOSKELETAL: Positive for arthritis.  NEUROLOGIC:  No CVA or TIA.   PSYCHIATRIC:  No anxiety or depression.  All other systems reviewed and negative.   PHYSICAL EXAMINATION: GENERAL:  The patient is awake, alert, oriented x 3, not in acute distress.  VITAL SIGNS:  Afebrile, pulse is 97, blood pressure is 118/78. Sats are 96% on room air.  HEENT: Atraumatic, normocephalic. PERLA. EOM intact. Oral mucosa is moist.  NECK:  Supple. No JVD. No carotid bruit.  RESPIRATORY:  Clear to auscultation bilaterally. No rales, rhonchi, respiratory distress or labored breathing.  CARDIOVASCULAR: Both the heart sounds are normal. Rate, rhythm is regular. PMI not lateralized. Chest nontender. Good pedal pulses, good femoral pulses. No lower extremity edema.  ABDOMEN: Soft, benign. There is some diffuse tenderness present over the upper abdomen. There was no guarding or rigidity. No abdominal mass felt. There are several well-healed abdominal scars present. Bowel sounds are hypoactive.  EXTREMITIES: Good pedal pulses, good femoral pulses. No lower extremity edema.  NEUROLOGIC:  Grossly  intact cranial nerves II to XII. No motor or sensory deficits.  PSYCHIATRIC: The patient is awake, alert, oriented x 3.  SKIN: Warm and dry.   DIAGNOSTIC DATA:  CT abdomen shows there is a lower abdominopelvic ventral hernia, appears inflamed, contains loops of small bowel and large bowel. There is no obstructive pattern currently. There is moderate amount stool throughout the colon; however, that may reflect constipation. There is no evidence of obstruction, gallbladder is surgically absent. There is intrahepatic ductal dilatation. No hepatic mass noted. The pancreas and spleen appear normal. There is no intraabdominal pelvic abscess or free fluid. White count is 7.9, H and H is 11.9 and 34.8, platelet count is 324. Comprehensive metabolic panel within normal limits. .   ASSESSMENT: A 54 year old Alison Alison Walker with history of a ventral hernia, on and off abdominal pain, and history of Clostridium difficile colitis in the past, comes in with:   1.  Abdominal pain, on and off for 2 to 3 days, generalized. Suspect from constipation and mild ventral hernia inflammation, without evidence of any obstruction as noted on CT of the abdomen. Will admit patient for overnight observation.   2.  Full liquid diet.   3.  MiraLAX p.o. to help with constipation, p.r.n. IV and p.o. pain medications.   4. Surgery consult for ventral hernia and possible mild inflammation. The patient's white count is normal. She is not having any fever. I will hold off on any IV antibiotics unless Surgery thinks antibiotics are needed at this time.   5.  Asymptomatic urinary tract infection. Will treat with Keflex b.i.d. for 5 days.   6.  Known history of ventral hernia.   7.  Gastroesophageal reflux disease:   8.  Hypertension. Will continue p.o. lisinopril and atenolol.   9.  Deep vein thrombosis prophylaxis with heparin subcu t.i.d.   Further workup according to patient's clinical course. Hospital admission plan was  discussed with patient.   TIME SPENT:  50 minutes    ____________________________ Gus Height A. Posey Pronto, MD sap:mr D: 12/01/2012 19:02:08 ET T: 12/01/2012 19:52:39 ET JOB#: [id number]  cc: Lakaisha Danish A. Posey Pronto, MD, <Dictator> Ilda Basset MD ELECTRONICALLY SIGNED 12/08/2012 14:50

## 2014-11-02 NOTE — Consult Note (Signed)
PATIENT NAME:  Alison Walker, Alison Walker MR#:  [id number] DATE OF BIRTH:  August 29, 1960  DATE OF CONSULTATION:  01/04/2013  REFERRING PHYSICIAN:   CONSULTING PHYSICIAN:  Jerrol Banana. Burt Knack, MD  CHIEF COMPLAINT:  Abdominal pain.   HISTORY OF PRESENT ILLNESS:  This is a patient who experienced the acute onset of abdominal pain, although she has had pain like this similar on multiple episodes in the past.  She related it to her ventral hernia which is known and has been recommended that she see her surgeon in [place] for repair.  This suggestion was made most recently by both Dr. Marina Gravel and Dr. Rexene Edison, however the patient also states on further questioning that the abdominal pain has made her feel like she had to go to the bathroom and then she stooled all over herself with some cramping abdominal pain that was very similar to her prior episodes of C. diff. colitis.  She also carries with her a diagnosis of possible ischemic colitis, although a recent colonoscopy last fall by Dr. Candace Cruise failed to identify any ischemic areas including biopsies.    Today, she had a syncopal episode, possibly due to dehydration.  She had nausea, vomiting, abdominal pain and diarrhea.  She has had a recent admission for her nausea, vomiting, abdominal pain and constipation, most recently approximately three weeks ago.  She has also had a history of diverticulitis with a colon resection and colostomy, ultimately a colostomy closure, that was done at [facility] as well.   PAST MEDICAL HISTORY:  C. diff. colitis, diverticulitis, hypertension, reflux disease and ventral hernia.   PAST SURGICAL HISTORY:  Multiple C-sections, hysterectomy, colostomy with colostomy closure, open cholecystectomy, back surgery and D and C.   MEDICATIONS:  Multiple, see chart.   ALLERGIES:  CIPRO AND MORPHINE.   FAMILY HISTORY:  Noncontributory.   SOCIAL HISTORY:  The patient does not smoke.  She does not drink.   REVIEW OF SYSTEMS:  A 10 system review is  performed and negative with the exception of that mentioned in the HPI.   PHYSICAL EXAMINATION: GENERAL:  The patient is in Trendelenburg position.  VITAL SIGNS:  Her blood pressure is currently 105 and her heart rate is 64.  Initially her temp was listed at 94.2 with a pulse of 64, respirations 14 and blood pressure 60/36.  She has responded to fluids.  She remains with a pain scale of 9.  HEENT:  Shows no scleral icterus.  NECK:  No palpable neck nodes.  CHEST:  Clear to auscultation.  CARDIAC:  Regular rate and rhythm.  ABDOMEN:  Is showing a long midline scar, soft, nondistended, minimally tender.  No peritoneal signs.  No percussion tenderness.  No rebound tenderness.  There is a large wide-mouth ventral hernia which is partially reducible without much tenderness.  EXTREMITIES:  Show minimal edema.  NEUROLOGIC:  Grossly intact.  INTEGUMENT:  No jaundice.   LABORATORY, DIAGNOSTIC, AND RADIOLOGICAL DATA:  CT scan is personally reviewed and discussed with Dr. Renard Hamper.  She has thickening of her colon and rectum and liquid stool in her rectal vault.  Ventral hernia without sign of obstruction.    Creatinine is 1.49, BUN of 18.  Potassium of 3.3, albumin of 3.4, white blood cell count is 11 and an H and H of 14.6 and 43.2.   ASSESSMENT AND PLAN:  I was asked to see this patient who had a syncopal episode secondary to nausea, vomiting and abdominal pain.  She is not showing  any sign of acidosis and her white count is only mildly elevated.  She is minimally tender without peritoneal signs and has a reducible ventral hernia.  Dr. Marina Gravel had seen the patient in the past and suggested that she be seen by her surgeon in [facility] and that is planned apparently, but she has not done that since last fall's recommendations to do so.  She sees Dr. Candace Cruise for recurrent Clostridium difficile colitis, which may be present again at this point and it may be of value to repeat a contrasted CT scan once her creatinine  improves.  This was discussed with Dr. Renard Hamper.  She is to be admitted by [facility] and we will be happy to follow the patient while she is in the hospital.    ____________________________ Jerrol Banana. Burt Knack, MD rec:ea D: 01/04/2013 01:00:28 ET T: 01/04/2013 01:25:56 ET JOB#: [id number]  cc: Jerrol Banana. Burt Knack, MD, <Dictator> Florene Glen MD ELECTRONICALLY SIGNED 01/04/2013 2:09

## 2014-11-02 NOTE — Discharge Summary (Signed)
PATIENT NAME:  [name], Alison Walker MR#:  [id number] DATE OF BIRTH:  January 09, 1961  DATE OF ADMISSION:  01/04/2013 DATE OF DISCHARGE:  01/08/2013  PRESENTING COMPLAINT: Abdominal pain and hypotension.   DISCHARGE DIAGNOSES:  1.  Sigmoid colitis.  2.  Orthostatic hypotension, resolved after hydration.  3.  Hypertension.  4.  Chronic pain syndrome.  5.  Gastroesophageal reflux disease.  6.  History of chronic ventral hernia.   CODE STATUS: Full code.   MEDICATIONS:  1.  Lisinopril 10 mg daily.  2.  Omeprazole 40 mg daily.  3.  Aspirin 81 mg daily.  4.  Colace 100 mg 1 tablet 1 to 3 times a day.  5.  Flexeril 10 mg 3 times a day.  6.  Xanax 0.5 mg t.i.d.  7.  Tylenol 50 mg daily.  8.  Augmentin 875 mg b.i.d. 9.   Percocet 5/325 mg 1 every 8 hours as needed.   DIET: Low sodium diet. Mechanical soft diet.   FOLLOWUP: With [facility] in 1 to 2 weeks.   CONSULTATION:  1.  GI consultation with Dr. Gustavo Lah and Dr. Candace Cruise.  2.  Surgical consultation with Dr. Sherri Rad.   STUDIES: CT angiography of the abdomen and pelvis showed thickening of the wall of the sigmoid colon reflecting colitis or diverticulitis. No abscess or free fluid in the abdomen. The abdominal aorta appears normal for age without any evidence of aneurysm, dissection or significant soft or calcified plaque. Mesenteric and renal artery appeared normal. There is a small left pleural effusion and trace right pleural effusion with basilar atelectasis. White count is 7.4, creatinine 0.54, sodium is 140, potassium is 3.4 and chloride is 109.   [place] COURSE: Ms. Usman is a 54 year old Caucasian female, well known to our service from previous multiple admissions in the hospital because of abdominal pain. She comes to the Emergency Room with:  1.  Syncope. The patient had hypotension as a result of nausea, vomiting, diarrhea and dehydration, which improved after dopamine drip and IV fluids. She was kept in the CCU  overnight and weaned off dopamine drip. Her blood pressure is stable at this time.  2.  Recurrent colitis. Suspect could be due to ischemic colitis; however, the patient's CT angiography remained negative for dissection or abdominal aortc aneurysm. GI saw the patient and recommended continue antibiotics. The patient remained afebrile, white count remained stable. She was advanced to a soft diet prior to discharge.  3.  Acute renal failure, improved after IV fluids.  4.  Hypomagnesemia and hypokalemia were repeated.  5.  GERD, on PPI.  6.  Positive urine for cannabinoids and methadone. The patient denies any intentional use. She does have history of narcotic dependence and narcotic overuse.  7.  DVT prophylaxis. Subcutaneous heparin was given.   CODE STATUS: The patient remained a full code.   TIME SPENT: 40 minutes.  ____________________________ Hart Rochester Posey Pronto, MD sap:aw D: 01/09/2013 07:16:38 ET T: 01/09/2013 07:34:10 ET JOB#: [id number]  cc: Generoso Cropper A. Posey Pronto, MD, <Dictator> [facility] Ilda Basset MD ELECTRONICALLY SIGNED 01/26/2013 7:31

## 2014-11-02 NOTE — H&P (Signed)
PATIENT NAME:  Alison Walker, Alison Walker MR#:  [id number] DATE OF BIRTH:  1961-01-01  DATE OF ADMISSION:  01/04/2013  REFERRING PHYSICIAN:  Dr. Renard Hamper.   PRIMARY CARE PHYSICIAN:  Currently having none, goes to walk-in clinic.   CHIEF COMPLAINT:  Syncope, abdominal pain and diarrhea.   HISTORY OF PRESENT ILLNESS:  This is a 54 year old female with past medical history of diverticulosis, status post colostomy reversal, history of GERD, COPD, history of C. diff. colitis in the past, comes to the Emergency Room with complaints of syncope, the patient reports she has been feeling weak over the last day, as well, she had been complaining of diarrhea, watery, multiple episodes, nonbloody, with abdominal pain, she has been feeling weak, lightheaded and dizzy, as well had nausea and multiple episodes of vomiting, the patient had episode of syncope from standing position where she had loss of consciousness, but denies any head trauma, in the ED, the patient was found to be hypotensive with systolic blood pressure in the 60s, the patient has had multiple fluid boluses, despite that she remained hypotensive.  Then she was started on a dopamine drip, the patient had CT abdomen without contrast which did show evidence of gastritis/duodenitis, as well did show evidence of severe to moderate colitis, the patient is known to have history of ventral hernia which did show some inflammatory changes, the patient was seen by Dr. Burt Knack from surgical service in ED who did not think much about of her ventral hernia, the patient's C. diff. came back negative in ED, hospitalist service were requested to admit the patient for further management and treatment of her sepsis, the patient denies any chest pain, but complains of being generally weak, had abdominal pain where she did require IV Dilaudid with much improvement of her pain, as well, the patient had basic blood work done which showing her to be in acute renal failure with GFR of 40, as well  she had mild hypokalemia with a potassium of 3.3, the patient's urinalysis was positive for methadone, cannabinoids and benzodiazepines, the patient denies any drug abuse, she reports she was given some pain medicine by a friend recently.  The patient's urinalysis was negative, she had an ABG done which did show her to be in metabolic acidosis, as well with mild elevated lactic acid level at 1.5.   PAST MEDICAL HISTORY:   1.  C. diff. colitis September 2013.  Had normal colonoscopy thereafter, followed by Dr. Candace Cruise.  2.  Hypertension.  3.  GERD.  4.  History of ventral hernia.  5.  History of Wolff-Parkinson-White syndrome.   PAST SURGICAL HISTORY:  1.  Colostomy secondary to history of diverticulosis.  2.  Partial hysterectomy.  3.  Cholecystectomy.  4.  Back surgery.  5.  Reversal of colostomy.  6.  D and C.  7.  C-section.   ALLERGIES:  CIPRO AND MORPHINE CAUSING RASH.   HOME MEDICATIONS: 1.  Aspirin 81 mg daily.  2.  Lisinopril 10 mg oral daily.  3.  Xanax 0.5 mg 2 times a day.  4.  Atenolol 50 mg oral daily.  5.  Colace as needed.  6.  MiraLAX as needed.  7.  Flexeril 10 mg oral 3 times a day.  8.  Omeprazole 40 mg oral daily.    SOCIAL HISTORY:  The patient reports she has history of smoking, quit a couple of months ago, currently using patches, only have one cigarette every few days, denies any alcohol use, denies any  drug abuse, once told about her urine drug analysis, she reports she was given some pain medication by a friend which she does not know what kind of meds was that.   FAMILY HISTORY:  Significant for cardiac disease in her brother.   REVIEW OF SYSTEMS:  CONSTITUTIONAL:  The patient complains of generalized weakness, fatigue.  Denies any fever or chill.  EYES:  Denies blurry vision, double vision, inflammation, glaucoma.  EARS, NOSE, THROAT:  Denies tinnitus, ear pain, epistaxis or discharge.  RESPIRATORY:  Denies cough, wheezing, hemoptysis.  Has history of  COPD.  CARDIOVASCULAR:  Denies chest pain, edema, orthopnea.  Has history of palpitation and syncope.  GASTROINTESTINAL:  Complains of nausea, vomiting, diarrhea, abdominal pain.  Denies any hematemesis, coffee-ground emesis, bright red blood per rectum or jaundice.  GENITOURINARY:  Denies dysuria, hematuria, renal colic.  ENDOCRINE:  Denies polyuria, polydipsia, heat or cold intolerance.  HEMATOLOGY:  Denies anemia, easy bruising, bleeding diathesis.  INTEGUMENTARY:  Denies acne, rash or skin lesions.  MUSCULOSKELETAL:  Denies any gout, arthritis or cramps.  NEUROLOGIC:  Denies CVA, TIA, seizures.  Had syncope, lightheadedness and dizziness.  PSYCHIATRIC:  Has history of anxiety.  Denies any substance abuse, alcohol abuse, bipolar disorder or schizophrenia.   PHYSICAL EXAMINATION:  VITAL SIGNS:  Temperature 94.2, pulse 64, respiratory rate 18, blood pressure 90/45, saturating 98% on room air.  GENERAL:  Well-nourished female, looks comfortable in bed, in no apparent distress.  HEENT:  Head atraumatic, normocephalic.  Pupils equal, reactive to light.  Pink conjunctivae.  Anicteric sclerae.  Dry oral mucosa with cracked lips.  NECK:  Supple.  No thyromegaly.  No JVD.  No carotid bruits.  Has IV access in the left external jugular.  CHEST:  Good air entry bilaterally.  No wheezing, rales, rhonchi.  CARDIOVASCULAR:  S1, S2 heard.  No rubs, murmurs, gallops.  ABDOMEN:  Obese, mild diffuse tenderness, but no rebound, no guarding.  Bowel sounds present.  There is multiple surgical scars, she has a ventral hernia which is reproducible.  EXTREMITIES:  No edema.  No clubbing.  No cyanosis.  Dorsalis pedis pulses are +2 bilaterally.  SKIN:  Normal skin turgor.  Warm and dry.  PSYCHIATRIC:  Appropriate affect.  Awake, alert x 3.  Intact judgment and insight.  NEUROLOGIC:  Cranial nerves grossly intact.  Motor 5 out of 5.  LYMPHATICS:  No cervical or supraclavicular lymphadenopathy could be appreciated.     PERTINENT LABORATORY DATA AND IMAGING:  Glucose 143, BUN 18, creatinine 1.49, sodium 134, potassium 3.3, chloride 105, CO2 24, ALT 16, AST 24, alkaline phosphatase 65, total bilirubin 1.  Urine drugs positive for benzodiazepines, cannabinoids and methadone.  White blood cells 11.5, hemoglobin 14.6, hematocrit 43.2, platelets 385, INR of 1.  C. diff. negative.  Urinalysis negative for leukocyte esterase and nitrite.  ABG showing pH of 7.23, pO2 of 109, pCO2 of 41.  Lactic acid 1.5.  CT abdomen without contrast showing finding suggesting moderate to severe colitis involving the left colon and ventral midline hernia containing loops of small bowel and gastritis/duodenitis picture.   ASSESSMENT AND PLAN: 1.  Syncope, this is related to her hypotension, which is due to sepsis and dehydration.  2.  Sepsis, this is most likely due to colitis/duodenitis/gastritis, surgical consult appreciated, the patient is Clostridium difficile negative at this point, so we will check her stool work-up, she will be started on IV antibiotic levofloxacin and IV Flagyl, as well we will add by mouth vancomycin  giving her history of Clostridium difficile and fear of recurrence while she is on IV antibiotics, the patient currently on a dopamine drip, and if she is still hypotensive after appropriate fluid replacement she might require a central line for pressors, as well we will continue with aggressive IV fluid resuscitation, she is on her 6 liters of normal saline in the ED, but she still appears to be volume-depleted and requiring more fluid hydration, as well does not appear to be in any respiratory distress, so we will continue with aggressive IV fluid resuscitation.  3.  Acute renal failure.  This is most likely due to her volume depletion.  We will hold lisinopril.  We will hold all nephrotoxic agents.  We will continue with fluids.  4.  Hypokalemia.  We will replace.  5.  Hypertension.  The patient is currently hypotensive  secondary to her symptoms and hypovolemia.  We will hold all her blood pressure medications.  6.  Gastroesophageal reflux disease.  We will continue the patient on IV Protonix.  7.  Positive urine for cannabinoids and methadone, the patient denies any intentional use at this point.  We are going to try to counsel her.  8.  Deep vein thrombosis prophylaxis.  SubQ heparin.  9.  Gastrointestinal prophylaxis.  On IV Protonix.  10.  CODE STATUS:  FULL CODE.   Total critical care time spent on admission and patient care 65 minutes.    ____________________________ Albertine Patricia, MD dse:ea D: 01/04/2013 03:10:23 ET T: 01/04/2013 04:25:52 ET JOB#: [id number]  cc: Albertine Patricia, MD, <Dictator> Larkin Alfred Graciela Husbands MD ELECTRONICALLY SIGNED 01/04/2013 6:10

## 2014-11-02 NOTE — Discharge Summary (Signed)
PATIENT NAME:  Alison Walker, Alison Walker MR#:  [id number] DATE OF BIRTH:  09/07/1960  DATE OF ADMISSION:  12/01/2012 DATE OF DISCHARGE:  12/03/2012  DISCHARGE DIAGNOSES: 1.  Constipation.  2.  Ventral hernia.  3.  Gastroesophageal reflux disease.  4.  Chronic back pain.  5.  Hypertension.  6.  Urinary tract infection.   CONDITION ON DISCHARGE: Stable.   CODE STATUS: FULL CODE.   DISCHARGE MEDICATIONS: 1.  Lisinopril 10 mg once a day.  2.  Omeprazole 40 mg once a day.  3.  Aspirin 81 mg once a day.  4.  Acetaminophen/hydrocodone 325/5 mg tablet 3 times a day as needed for pain. 5.  MiraLax powder 17 grams as needed for constipation  6.  Colace 100 mg 1 to 3 times a day.  7.  Cephalexin 250 mg oral capsule every 12 hours for 4 days.  8.  Cyclobenzaprine 10 mg oral tablet 3 times a day.  9.  Percocet 5/325 mcg every 6 hours as needed for pain.   DIET ON DISCHARGE: Low sodium. D  DIET CONSISTENCY: Regular.   ACTIVITY: As tolerated.   TIMEFRAME TO FOLLOW-UP:  In 1 to 2 weeks in surgical clinic for hernia. Advised to follow with Dr. Marina Gravel for hernia in clinic.  HISTORY OF PRESENT ILLNESS: This is a 54 year old Caucasian female with history of diverticulosis status post colostomy ,gastroesophageal reflux disease, COPD, and C. diff, came with abdominal pain, but no nausea and vomiting. CT of the abdomen with IV contrast was done which showed mild inflammation around ventral hernia without obstruction and considerable amount of stool in entire colon.  The patient received some IV Dilaudid in the Emergency Room and admitted for further management by medical service.   HOSPITAL COURSE AND STAY:  1.  To get relief of her constipation, she received MiraLax and she had good bowel movements the next day and she felt very much relieved.  For ventral hernia, which was mild inflammation but no pain, surgery consult was awaited for 2 days and then finally I spoke to Dr. Marina Gravel. He told that if the hernia is not  obstructed they are not going to do anything for it and the patient felt symptomatically much better so it is safe to discharge the patient home and tell her to follow with him in the surgery clinic. That is what I spoke to the patient and advised her to go to the clinic.  2.  Urinary tract infection. The patient was asymptomatic, but UA was positive. She was started with Keflex and discharged with the same.  3.  Hypertension. We continued lisinopril and atenolol as she was taking at home. 4.  Gastroesophageal reflux disease. We continued PPI.  IMPORTANT LABORATORY RESULTS IN THE HOSPITAL:  WBC count 7.9, hemoglobin 11.9, platelet count 324.  Glucose 75, creatinine 0.53, sodium 136, potassium 4.8. Urinalysis:  28 WBCs and trace leukocytes with 1+ bacteria.   DIAGNOSTICS:  CT of the abdomen and pelvis with contrast: Lower abdominal, upper pelvic ventral hernia, inflamed and contains looks of small and large bowel.  No obstructive pattern. No evidence of acute urinary tract abnormality. Gallbladder is surgically absent. Intrahepatic ductal dilation.  No intraabdominal or pelvic abscess, free fluid, or gas.  Moderate amount of stool throughout the colon.   TOTAL TIME SPENT: In this discharge is 45 minutes. ____________________________ Ceasar Lund Anselm Jungling, MD vgv:sb D: 12/05/2012 23:30:00 ET T: 12/06/2012 07:38:52 ET JOB#: [id number]  cc: Ceasar Lund. Anselm Jungling, MD, <Dictator> Mark A.  Marina Gravel, MD Alison Basta MD ELECTRONICALLY SIGNED 12/08/2012 22:33

## 2014-11-02 NOTE — Op Note (Signed)
PATIENT NAME:  Alison Walker, Alison Walker MR#:  [id number] DATE OF BIRTH:  November 07, 1960  DATE OF PROCEDURE:  01/04/2013  PREOPERATIVE DIAGNOSIS: Hypotension.   POSTOPERATIVE DIAGNOSIS: Hypotension.   PROCEDURE: Right femoral vein central line placement.   SURGEON: Harrison Paulson E. Burt Knack, MD   ANESTHESIA: Local anesthetic.   INDICATIONS: This is a patient with prolonged hypotension, on pressors, requires IV access. She was counseled concerning the risks of bleeding, infection, thrombosis and nonfunction. She understood and agreed to proceed.   FINDINGS: Placement in the right femoral vein without difficulty.   DESCRIPTION OF PROCEDURE: The patient was identified. After obtaining informed consent, local anesthetic was infiltrated in skin and subcutaneous tissues after prepping and draping the patient. Caps, gowns and masks were utilized by all.   On the second attempt, a large-bore needle was placed within the right femoral vein, and a Seldinger wire was placed. An incision was made, and over the Seldinger wire that was placed, the introducer dilator, followed by the previously flushed triple-lumen catheter. The wire was removed. All ports were aspirated and flushed for function and capped. Then the catheter was sutured to the skin of the anterior thigh, and a sterile dressing was placed.   The patient tolerated this procedure well. There were no complications. She was left in stable condition in the ICU.   Note that the right femoral approach was chosen because she has had multiple central lines in the neck and subclavian prior.   ____________________________ Jerrol Banana Burt Knack, MD rec:OSi D: 01/04/2013 05:15:08 ET T: 01/04/2013 05:46:01 ET JOB#: [id number]  cc: Jerrol Banana. Burt Knack, MD, <Dictator> Florene Glen MD ELECTRONICALLY SIGNED 01/04/2013 19:11

## 2014-11-02 NOTE — Consult Note (Signed)
Chief Complaint:  Subjective/Chief Complaint Pt notes 8/10 lower abdominal pain.  Notes 1 small episode of clear emesis.  No BM x 24hrs so stool culture was never collected.  Interestingly, KUB yesterday showed moderate stool in colon.   VITAL SIGNS/ANCILLARY NOTES: **Vital Signs.:   26-Jun-14 04:00  Temperature Temperature (F) 98.2  Celsius 36.7  Temperature Source oral  Pulse Pulse 64  Pulse source if not from Vital Sign Device per cardiac monitor  Respirations Respirations 12  Systolic BP Systolic BP 678  Diastolic BP (mmHg) Diastolic BP (mmHg) 55  Mean BP 73  Pulse Ox % Pulse Ox % 92  Pulse Ox Activity Level  At rest  Oxygen Delivery Room Air/ 21 %  Pulse Ox Heart Rate 64    08:00  Pulse Pulse 74  Respirations Respirations 12  Systolic BP Systolic BP 938  Diastolic BP (mmHg) Diastolic BP (mmHg) 68  Mean BP 90  Pulse Ox % Pulse Ox % 95  Pulse Ox Activity Level  At rest  Oxygen Delivery Room Air/ 21 %  Pulse Ox Heart Rate 76   Brief Assessment:  GEN well developed, well nourished, no acute distress, A/Ox3   Cardiac Regular  -- LE edema   Respiratory normal resp effort   Gastrointestinal details normal Soft  Nondistended  Bowel sounds normal  +TTP bilateral lower quads   Additional Physical Exam Skin: Pink, warm, dry   Lab Results:  Hepatic:  26-Jun-14 04:57   Bilirubin, Total 0.4  Alkaline Phosphatase  47  SGPT (ALT)  10  SGOT (AST) 16  Total Protein, Serum  5.3  Albumin, Serum  2.4  Routine Chem:  26-Jun-14 04:57   Result Comment LABS - This specimen was collected through an   - indwelling catheter or arterial line.  - A minimum of 34ms of blood was wasted prior    - to collecting the sample.  Interpret  - results with caution.  Result(s) reported on 05 Jan 2013 at 05:27AM.  Glucose, Serum  127  BUN  5  Sodium, Serum 142  Potassium, Serum  3.2  Chloride, Serum  110  CO2, Serum 27  Calcium (Total), Serum  7.9  Osmolality (calc) 282  eGFR (African  American) >60  eGFR (Non-African American) >60 (eGFR values <635mmin/1.73 m2 may be an indication of chronic kidney disease (CKD). Calculated eGFR is useful in patients with stable renal function. The eGFR calculation will not be reliable in acutely ill patients when serum creatinine is changing rapidly. It is not useful in  patients on dialysis. The eGFR calculation may not be applicable to patients at the low and high extremes of body sizes, pregnant women, and vegetarians.)  Anion Gap  5  Magnesium, Serum  1.3 (1.8-2.4 THERAPEUTIC RANGE: 4-7 mg/dL TOXIC: > 10 mg/dL  -----------------------)  Routine Hem:  26-Jun-14 04:57   WBC (CBC) 10.8  RBC (CBC)  3.42  Hemoglobin (CBC)  10.7  Hematocrit (CBC)  30.6  Platelet Count (CBC) 252  MCV 90  MCH 31.3  MCHC 34.9  RDW  15.1  Neutrophil % 75.6  Lymphocyte % 16.1  Monocyte % 7.4  Eosinophil % 0.6  Basophil % 0.3  Neutrophil #  8.2  Lymphocyte # 1.7  Monocyte # 0.8  Eosinophil # 0.1  Basophil # 0.0   Radiology Results: XRay:    25-Jun-14 12:24, KUB - Kidney Ureter Bladder  KUB - Kidney Ureter Bladder   REASON FOR EXAM:    colitis, ventral hernia  COMMENTS:   Bedside (portable):Y    PROCEDURE: DXR - DXR KIDNEY URETER BLADDER  - Jan 04 2013 12:24PM     RESULT: Air is seen within nondilated loops of large and small bowel. A   moderate to large amount of stool is appreciated within the colon. A   linearly oriented area of increased density projects in the region of the   right lateral pelvis. This has the appearance of a catheter or possibly a   left femoral catheter. The visualized bony skeleton is unremarkable.    IMPRESSION:      1. Nonobstructive bowel gas pattern with a moderate to large amount of   stool in region of the ascending colon.  Thank you for the opportunity to contribute to the care of your patient.         Verified By: Mikki Santee, M.D., MD   Assessment/Plan:  Assessment/Plan:  Assessment  Acute enterocolitis:  Ischemic vs. infectious.  Lactic acid normal.  No further diarrhea.  On antibiotic coverage for sepsis due to hypotension.  Weaning dopamine today.  C diff negative.  No BM x 24h so stool cx was never sent.  Interestingly mod-lg amt of stool in asc colon on KUB.   Ventral hernia:  Followed by surgery.  Daily KUB. Anemia:  Without gross GI bleeding.  Hgb went from normal to 10.7.  Will monitor. Electrolyte imbalance:  Mag/K repletion per attending   Plan 1) Continue IVFs, supportive measures, sepsis protocol antibiotic coverage 2) Advised pt to have staff collect stool culture if further diarrhea 3) Daily KUB 4) Continue PPI 5) Follow up CBC in AM 6) Mag/K repletion per attending   Electronic Signatures: Andria Meuse (NP)  (Signed 26-Jun-14 09:39)  Authored: Chief Complaint, VITAL SIGNS/ANCILLARY NOTES, Brief Assessment, Lab Results, Radiology Results, Assessment/Plan   Last Updated: 26-Jun-14 09:39 by Andria Meuse (NP)

## 2014-11-04 NOTE — H&P (Signed)
PATIENT NAME:  Alison Walker, Alison Walker MR#:  [id number] DATE OF BIRTH:  Sep 20, 1960  DATE OF ADMISSION:  12/29/2011  CHIEF COMPLAINT: Right-sided lower abdominal pain.  PRIMARY CARE PHYSICIAN: None.   SUBJECTIVE: This is a 54 year old female with a complicated past medical history involving several different abdominal surgeries who presents to the ER with right-sided abdominal pain and one episode of bright red blood per rectum at around 11 p.m. last night. The patient's abdominal pain is described as stabbing pain radiating all the way into her pelvic area. She also describes what sounds like bladder spasms but denies any urinary urgency, frequency, or dysuria. The patient has not had any nausea or vomiting. Abdominal pain is chronic but has worsened over the last day. The patient states that she has two ventral hernias and is currently considering undergoing surgery for these at [facility] but has no health insurance at this time. She denies any fever at home but states that she had a low-grade fever in the ER. She denies any chills or rigors. She denies any chest pain or any shortness of breath.   PAST MEDICAL HISTORY:  1. History of diverticulitis in 1998 with a colectomy and creation of a colostomy.  2. Colostomy reversal in 2000.  3. Three Cesarean sections.  4. Hysterectomy.  5. Open cholecystectomy.  6. Two operations to remove each of her ovaries.  7. Large abdominal hernia repair in 2005.  8. Another surgery in [place] in May of 2005 for an abdominal infection.  9. History of Wolff-Parkinson-White associated with tachycardia status post ablation.  10. The patient states that she had a myocardial infarction last year.   SOCIAL HISTORY: [place] and occasionally lives with a friend. She is a nonsmoker, nonalcoholic.   FAMILY HISTORY: Reviewed and found to be negative for coronary artery disease and hypertension.   HOME MEDICATIONS:  1. Colace 100 mg p.o.  b.i.d.  2. Lisinopril 10 mg p.o. daily.  3. Atenolol 50 mg p.o. daily.  4. Aspirin 81 mg p.o. daily.  5. Citrucel.   REVIEW OF SYSTEMS: CONSTITUTIONAL: No fever, fatigue, weakness, weight loss or weight gain. EYES: No blurry vision, double vision, pain, redness, inflammation, glaucoma. ENT: No tinnitus, ear pain, hearing loss. No seasonal allergies. RESPIRATORY: No cough, wheezing, hemoptysis, dyspnea, asthma, painful respiration. CARDIOVASCULAR: No chest pain, orthopnea, edema, arrhythmia, palpitations. GI: Complaining of low abdominal pain but no nausea or vomiting. No hematemesis or gastroesophageal reflux disease.  GU: No dysuria, hematuria, renal calculi. ENDOCRINE: No polyuria, nocturia, thyroid problems. HEME: No anemia, easy bruising, bleeding. INTEGUMENTARY: No acne, rash. No swelling, gout, redness, limited activity. NEUROLOGIC: No numbness, weakness, dysarthria, epilepsy. PSYCHIATRIC: No anxiety, insomnia, bipolar disorder, depression.   PHYSICAL EXAMINATION:   VITAL SIGNS: Blood pressure 107/55, respirations 11, pulse 68, 99% on room air.   GENERAL: Currently appears to be somewhat uncomfortable and is complaining of abdominal pain in her right lower quadrant, otherwise alert and oriented x3.  HEENT: Pupils equal and reactive to light. Extraocular movements are intact. Sclerae anicteric. Oral mucosa is moist. No lesions on the tongue.   NECK: Supple. No JVD. No thyromegaly.   LUNGS: Clear to auscultation bilaterally. No wheezes, crackles, or rhonchi.   CARDIOVASCULAR: Regular rate and rhythm. No murmurs, rubs, or gallops.   ABDOMEN: Obese with multiple midline scars. She has no mucopurulent drainage and all of her scars appear to be well healed. Tenderness to palpation in the right lower quadrant without  any rigidity or guarding.   NEUROLOGIC: Cranial nerves II through XII grossly intact. Deep tendon reflexes 2+ bilaterally. Motor strength intact in bilateral upper and lower  extremities.   MUSCULOSKELETAL: Intact strength in bilateral upper and lower extremities. Moving all four extremities spontaneously.   SKIN: Without any skin rashes.   PSYCHIATRIC: Appears to be somewhat anxious, otherwise alert and oriented x3.   LABORATORY, DIAGNOSTIC, AND RADIOLOGICAL DATA: WBC 9.3, hemoglobin 11.6, hematocrit 35.8, platelet count 343, BUN 21, creatinine 1.24, sodium 139, potassium 4.0, chloride 106, bicarb 28, ALT 14, AST 15. Lipase 112. Urinalysis negative. INR 0.9.   CT scan of the abdomen and pelvis shows colitis of the sigmoid colon proximal to the bowel. Differential diagnosis infectious inflammatory versus ischemic etiology and soft tissue density of the left sigmoid colon thought to be secondary to bladder wall thickening, could be cystitis. Intrahepatic biliary dilatation.   ASSESSMENT AND PLAN:  1. Colitis, infectious versus ischemic. The patient will be admitted and will be started on Cipro and Flagyl Will keep her n.p.o. for now. Will start her on IV fluids. I presume that the process is probably ischemic given the fact that the patient had some bloody stools. Will hold her antihypertensive medications. Hydrate her aggressively with IV fluids. Her bleeding was self-limiting. Follow CBC.  2. Cystitis. The patient will be started on ciprofloxacin for her colitis which should also cover her for presumed cystitis.  3. Chronic pain syndrome. The patient is on chronic Vicodin at home and has a high tolerance to narcotic medications and states that her pain has not been relieved with the morphine and Vicodin that was given to her in the ER. Therefore, will try her on some IV Dilaudid and see if her pain improves.  4. She is a FULL CODE.  time spent 70 mins ____________________________ Alison Dumas, MD na:drc D: 12/29/2011 16:41:50 ET T: 12/29/2011 17:00:26 ET JOB#: [id number]  cc: Alison Dumas, MD, <Dictator> Alison Dumas MD ELECTRONICALLY SIGNED 12/29/2011 19:32

## 2014-11-04 NOTE — Consult Note (Signed)
PATIENT NAME:  Alison Walker, GREENLEY MR#:  [id number] DATE OF BIRTH:  1961/04/09  DATE OF CONSULTATION:  12/30/2011  REFERRING PHYSICIAN:   CONSULTING PHYSICIAN:  Jill Side, MD  REASON FOR CONSULTATION: Abdominal pain.   HISTORY OF PRESENT ILLNESS: 54 year old female with history of chronic abdominal pain, multiple abdominal surgeries who presented to the Emergency Room yesterday with increasing abdominal pain for about last 48 hours as well as one bowel movement that was reported to be frankly bloody. Since then patient has had couple more bowel movements and they are reported to be brown and loose with very small amount of blood. Patient is also complaining of lower abdominal discomfort but again she has history of chronic abdominal pain, abdominal hernia that is considering surgery at [facility] lately. The pain is mainly located in the lower abdominal area and is crampy in nature. As mentioned above there has been no further bleeding except for very small amount of blood mixed in brown liquid stools. She is somewhat nauseated but denies any vomiting. There is no fever but she states that she had a low-grade fever in the Emergency Room.   PAST MEDICAL HISTORY:  1. History of diverticulitis in 1998 after which she had a colectomy, a colostomy which was then reversed in 2000. 2. History of C-section.  3. Hysterectomy.  4. Cholecystectomy. 5. Oophorectomy. 6. Abdominal hernia repair in 2005. 7. History of Wolff-Parkinson's-White syndrome, status post ablation.   SOCIAL HISTORY: She lives alone. She does not smoke or drink.   FAMILY HISTORY: Quite unremarkable.   HOME MEDICATIONS:  1. Colace. 2. Lisinopril. 3. Atenolol. 4. Aspirin. 5. Citrucel.   REVIEW OF SYSTEMS: Negative except for what is mentioned in the History of Present Illness.   PHYSICAL EXAMINATION:  GENERAL: Fairly well built female. She does not appear to be toxic or septic. She does not appear to be in any acute distress.    VITAL SIGNS: Temperature 98.6, pulse 62, respirations 20, blood pressure 126/79.   HEENT: Unremarkable.   NECK: Veins are flat.   LUNGS: Grossly clear to auscultation bilaterally with fair air entry. No added sounds.   CARDIOVASCULAR: Regular rate and rhythm. No gallops or murmur.   ABDOMEN: Fairly soft and benign abdomen. Bowel sounds are positive and somewhat hyperactive. Diffuse lower abdominal tenderness was noted although there is no significant rebound or point tenderness.   NEUROLOGIC: She is fully awake, alert, and oriented and neurological examination appears to be unremarkable.   LABORATORY, DIAGNOSTIC, AND RADIOLOGICAL DATA: White cell count 5.1, hemoglobin 11, hematocrit 33.7, platelet count 277. Electrolytes, BUN and creatinine are normal. Liver enzymes are normal. Serum lipase is normal at 112. CT scan of abdomen and pelvis showed some thickening of the wall of the sigmoid colon above the site of reanastomosis as well as what appears to be some thickening of the wall of the bladder. Urinalysis is negative.   ASSESSMENT AND PLAN: Patient with what appears to be sigmoid colitis, most likely infectious, although ischemia is another consideration. Patient does not have an acute abdomen. She is afebrile and her white cell count is normal. Patient has been started on Cipro and Flagyl with which I agree. There does appear to be some element of cystitis although it may be reactionary from adjacent sigmoid colitis as her urinalysis is fairly unremarkable. I would recommend obtaining stool studies. Continue Cipro and Flagyl. Clear liquid diet which can be advanced gradually if patient continues to do well. Patient would require a colonoscopy although  this can be and should be done after the resolution of acute inflammation. Discussed with the patient. Will follow.   ____________________________ Jill Side, MD si:cms D: 12/30/2011 17:08:38 ET T: 12/30/2011 17:26:47  ET JOB#: [id number]  cc: Jill Side, MD, <Dictator>  Jill Side MD ELECTRONICALLY SIGNED 01/06/2012 13:07

## 2014-11-04 NOTE — Consult Note (Signed)
Chief Complaint:   Subjective/Chief Complaint No BM today. Feels better but continues to complain of abdominal pain which is a chronic problem.   VITAL SIGNS/ANCILLARY NOTES: **Vital Signs.:   20-Jun-13 15:59   Temperature Temperature (F) 98.3   Celsius 36.8   Temperature Source Oral   Pulse Pulse 74   Pulse source per vital sign device   Respirations Respirations 20   Systolic BP Systolic BP 722   Diastolic BP (mmHg) Diastolic BP (mmHg) 98   Mean BP 111   Pulse Ox % Pulse Ox % 96   Pulse Ox Activity Level  At rest   Oxygen Delivery Room Air/ 21 %   Brief Assessment:   Additional Physical Exam No distress. No significant abdominal tenderness when palpated with scope.   Assessment/Plan:  Assessment/Plan:   Assessment Sigmoid colitis. Diarrhea and bleeding have resolved. Chronic pain syndrome.    Plan Agree with gradually advancing diet. Continue antibiotics as OP for a total of 10 days. Patient to folow up with GI in couple of weeks for an OP colonoscopy. Will follow.   Electronic Signatures: Jill Side (MD)  (Signed 20-Jun-13 19:25)  Authored: Chief Complaint, VITAL SIGNS/ANCILLARY NOTES, Brief Assessment, Assessment/Plan   Last Updated: 20-Jun-13 19:25 by Jill Side (MD)

## 2014-11-04 NOTE — Discharge Summary (Signed)
PATIENT NAME:  Alison Walker, Alison Walker MR#:  [id number] DATE OF BIRTH:  10-21-1960  DATE OF ADMISSION:  12/29/2011 DATE OF DISCHARGE:  01/01/2012  PRESENTING COMPLAINT: Abdominal pain.   DISCHARGE DIAGNOSES:  1. Sigmoid diverticulitis.  2. Hypertension.   MEDICATIONS ON DISCHARGE:  1. Norco 5/325, 1 p.o. t.i.d. p.r.n.  2. Ciprofloxacin 500 mg p.o. b.i.d.  3. Flagyl 500 mg p.o. t.i.d.  4. Phenergan 25 mg p.o. b.i.d. p.r.n.  5. Atenolol 50 mg daily.  6. Lisinopril 10 mg daily.  7. Align 4 mg p.o. daily.  8. Colace 100 mg daily.  9. Ibuprofen 200 mg 3 tablets as needed for pain with meals.  10. Citrucel 500 mg 2 tablets daily.  11. Omeprazole 40 mg p.o. daily.   FOLLOW UP: Follow up with [facility] at your appointment in August 2013.   LABORATORY, DIAGNOSTIC AND RADIOLOGICAL DATA:  White count 5, hemoglobin and hematocrit 11 and 32.5, platelet count 331.   Comprehensive metabolic panel within normal limits except albumin of 2.7.   Blood cultures negative in 36 hours.   CT of the abdomen shows findings involving colitis of the sigmoid colon. Soft tissue density to the left sigmoid colon is felt to be secondary to acute soft tissue density. Thickening of the adjacent posterior dome of the bladder. This is nonspecific and may be reactive to aforementioned process. Intrahepatic biliary ductal dilatation is similar to prior. May be related to prior cholecystectomy. Lipase 112. Urinalysis negative for urinary tract infection. Urine culture negative in 36 hours,   CONSULTATION: GI consultation Dr. Dionne Milo.   HOSPITAL COURSE: [name] is a 54 year old Caucasian female comes to the Emergency Room with:  1. Abdominal pain. She was admitted with sigmoid colitis, appears likely infectious. Continued on IV Cipro, Flagyl and is changed to p.o. for a total of 10 days. She was started on clear liquid diet as her symptoms improved and changed to soft diet at discharge. Dr. Dionne Milo saw patient and recommend  continued antibiotics for total of 10 days and follow up GI as outpatient which patient will go to [facility] on her August appointment.  2. Urinary tract infection. On Cipro.  3. Chronic pain syndrome. Is chronically on Vicodin. She has high tolerance to narcotics.  4. History of hypertension. Her atenolol and lisinopril were resumed prior to discharge.  5. Hospital stay otherwise remained stable.   TIME SPENT: 40 minutes.  ____________________________ Hart Rochester Posey Pronto, MD sap:cms D: 01/02/2012 15:17:45 ET T: 01/02/2012 15:27:19 ET JOB#: [id number]  cc: Conny Moening A. Posey Pronto, MD, <Dictator> Ilda Basset MD ELECTRONICALLY SIGNED 01/02/2012 15:46

## 2014-11-11 NOTE — Consult Note (Signed)
Urology Consultation Report for Consultation: Urinary Retention MD: Theodoro Grist, M.D.MD: Darcella Cheshire, M.D. 54 y.o WF admitted 09/07/2014 with Urinary Retention, Rectal Bleeding, Hyponatremia, Hypertension, CHF w/Hypoxemia.  Pt presented to the [facility] ER with sharp, severe, lower abd pain with an inability to void for 24 hrs.  CT Abd/Pelvis revealed marked bladder distention with fullness of the renal collecting systems bilaterally and hydroureter to the level of the bladder without stone or ureteral lesion.  A foley catheter was placed in the ER with 1079mL of clear urine obtained.  UA was neg, except for a markedly dilute urine with a SG of 1,008,  Urine and Blood Cultures remain neg to date.  The pt denies dysuria, gross hematuria, urinary freq.  Does endorse a 2 month h/o occas, mild urge urinary incontinence.  PSH is notable for the following: s/p ?Lumbar Lam/Diskectomy in 1991 [facility]), s/p Hysterectomy for uterine rupture 4 days s/p C-section in 1987 [facility]), b/L (sequential) oophorectomy for benign cysts in 1994 [facility]), s/p partial colectomy with partial cystectomy for a sigmoid diverticular abscess ? involving the posterior bladder in 1999 [facility]).  was seen by GI in consultation and found to have a large fecal impaction that was unable to disimpacted manually.  Soap Suds Enema was successful in relieving the fecal impaction.  The pt endorsed a h/o chronic constipation requiring a bowel regimen consisting of daily Dulculax and qOD Miralax. was d/c'd this AM.  Pt reports voiding without difficulty. as per the HPI and the H&P by Dr. Ether Griffins dated 09/07/2014. denies F/Cdenies rashes/lesionsdenies URI, cough, rhinorrheaendorses mild  dyspnea requiring an inhalerdenies CP, peripheral edemadenies N/V, endorses chronic constipation and chronic, idiopathic abd painper HPIendorses some increase in thirst, denies heat/cold intoleranceendorses, chronic diffuse arthritis (stable)denies lateralizing weaknessdenies  bleeding diathesis AVSS, 97% RA SatWDWNWF in NADno rashes/lesions about the Head/NeckNC/AT, EOMI, anictericno masses. 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  Electronic Signatures: Darcella Cheshire (MD)  (Signed on 27-Feb-16 12:21)  Authored  Last Updated: 27-Feb-16 12:21 by Darcella Cheshire (MD)

## 2014-11-11 NOTE — Consult Note (Signed)
Brief Consult Note: Diagnosis: Rectal Bleeding.   Patient was seen by consultant.   Comments: Alison Walker is a 54 y/o female admitted with abdominal pain & reports rectal bleeding.  She has mild anemia.  She has significant fecal impaction & hemoccult negative stool on exam today.  Pt unable to describe rectal bleeding to me at this time as she appears sedated, previously received narcan.  Last normal colonoscopy 08/2012 by Dr Candace Cruise with poor prep.  Hx ischemic colitis, c diff colitis, recurrent diverticulitis requiring partial colectomy in 1998 & ventral hernia.  No active lower GI Bleed at this time on digital rectal exam.  Plan: 1) Colace BID 2) soap suds enema today with disimpaction 3) miralax 17 grams BID 4 ) follow H/H & consider bleeding scan or possible colonoscopy if bleeding returns Thanks for allowing [place] to participate in her care.  Please see full dictated note. #[id number].  Electronic Signatures: Andria Meuse (NP)  (Signed [phone] 17:13)  Authored: Brief Consult Note   Last Updated: 26-Feb-16 17:13 by Andria Meuse (NP)

## 2014-11-11 NOTE — H&P (Signed)
PATIENT NAME:  Alison Walker, Alison Walker MR#:  [id number] DATE OF BIRTH:  04-17-1961  DATE OF ADMISSION:  09/07/2014  PRIMARY CARE PHYSICIAN:  Dr. Tobie Lords from [place].    HISTORY OF PRESENT ILLNESS:  The patient is a 54 year old Caucasian female with history of sigmoid colitis in July 2014, history of dehydration and orthostatic hypotension during the same admission, chronic pain syndrome, history of hypertension, gastroesophageal reflux disease, who presented to the hospital with complaints of abdominal pain. According to the patient she was doing well up until today when she started having suprapubic lateral lower part abdominal pain. Pain is described as sharp, 10 out of 10 by intensity, constant, with no aggravating or alleviating factors. She stated that she was not able to empty her bladder.  It started just yesterday. She also is complaining of flank pain for approximately 1 week now. Upon further questioning it appears that she is also having bright red blood per her rectum which happened yesterday and dysuria, burning sensation and pain with urination. She also admits to having some upper abdominal pains. She says that she is scared to eat sometimes because of the worsening pain.  She has been losing weight. She tells me that she lost probably 50 pounds over the past few months. She is somewhat confused and I not sure how much of the history is true.  Apparently she came in to the Emergency Room with abdominal pain and some hypotension with systolic blood pressure of 88/50. She was given IV fluids and her blood pressure improved. However whenever she was given morphine her O2 saturations dropped down to 82% on room air at rest. She got chest x-ray done which revealed some mild pulmonary edema. At that point the patient was given Narcan as well as a little dose of Lasix, however she still did not urinate yet and remains somewhat somnolent and confused during my evaluation.   PAST MEDICAL HISTORY:  Significant for  history of sigmoid colitis in July 2014 admission, history of dehydration during the same admission and orthostatic hypotension, chronic pain syndrome, hypertension, gastroesophageal reflux disease, chronic ventral hernia, history of WPW syndrome, Clostridium difficile colitis in September 2013, history of normal colonoscopy thereafter followed by Dr. Candace Cruise, hypertension, gastroesophageal reflux disease, COPD.   PAST SURGICAL HISTORY:  Colostomy secondary to history of diverticulosis and reversal of colostomy, bilateral hysterectomy, cholecystectomy, back surgery, D and C and C-section.   ALLERGIES: CIPRO AND MORPHINE WHICH CAUSES A RASH.   HOME MEDICATIONS: According to medical records the patient is on acetaminophen/hydrocodone 325 mg/5 mg 1 tablet 3 times daily as needed, alprazolam 1 mg twice daily as needed, atenolol 50 mg p.o. daily, cyclobenzaprine 10 mg p.o. twice daily, dicyclomine 10 mg 3 times daily, escitalopram 20 mg p.o. daily, lisinopril 10 mg p.o. daily, loratadine 10 mg p.o. daily, omeprazole 40 mg p.o. twice daily.     SOCIAL HISTORY: The patient reports that she has history of smoking, quit a couple of years ago according to medical records, however tells me now that she still smokes 1 pack a day since age of 10, has been smoking for 40 years. No alcohol abuse. No drug abuse   FAMILY HISTORY: Cardiac disease in her brother.     REVIEW OF SYSTEMS: Difficult to obtain as the patient is somewhat confused and very somnolent. She admits to feeling feverish and chilly for a couple of weeks now, fatigue and weak, weight loss of about 50 pounds over the past few months. Abdominal pain,  is being scared to eat because of increasing pain whenever she eats.  Bifocal glasses for vision. Some cough as well as intermittent wheezing as well as shortness of breath especially on exertion, some intermittent arrhythmias in her chest, also some chest pains. Sometimes she will have panic attacks, syncopal  episodes intermittently, abdominal pain as mentioned above with rectal bleeding, has been having some constipation, however had a bowel movement yesterday. She admits of having some bright red blood in her stools, also blood clots in the stool, although history is very difficult to obtain.  There is some urinary incontinence as well as dysuria symptoms and difficulty emptying her bladder. No high fevers, weight gain.  EYES: Denies any blurry vision, double vision, or glaucoma.  EARS, NOSE, AND THROAT: Denies any tinnitus, allergies, epistaxis, sinus pain, dentures, difficulty swallowing.  RESPIRATORY:  Denies any hemoptysis, asthma, COPD.   CARDIOVASCULAR: Denies any orthopnea, edema, palpitations.  GASTROINTESTINAL: Denies any nausea, vomiting, diarrhea, or hematemesis.  GENITOURINARY: Denies hematuria, frequency.  ENDOCRINE:  Denies polydipsia, nocturia, thyroid problems, heat or cold intolerance, or thirst.  HEMATOLOGIC: Denies anemia, easy bruising or bleeding, swollen glands.  SKIN: Denies acne, rashes, change in moles.  MUSCULOSKELETAL: Denies arthritis, cramps, swelling.  NEUROLOGIC: No numbness, epilepsy, or tremor.   HEMATOLOGIC: Denies anxiety, insomnia, or depression.   PHYSICAL EXAMINATION:  VITAL SIGNS: On arrival to the hospital the patient's vital signs, temperature is 98.7, pulse was 74, respiration was 18, blood pressure 84/42, saturation was 95% on room air.  GENERAL:  She is a well-nourished Caucasian female, mildly obese, laying on the stretcher.  I am having difficulty waking her up, we have to sternal rub her until I am able to get her up.  HEENT: Her pupils are equal and reactive to light. Extraocular muscles intact. No icterus or conjunctivitis. Has normal hearing. No pharyngeal erythema. Mucosa is moist. She has dried saliva in her mouth.  NECK: No masses. Supple, nontender. Thyroid is not enlarged. No adenopathy. No JVD or carotid bruits bilaterally. Full range of motion.   LUNGS: Clear to auscultation in all fields, but diminished breath sounds, but otherwise no rales, rhonchi, or wheezing. No labored inspirations, decreased effort, dullness to percussion, or overt respiratory distress, however the patient has few crackles heard at the bases, more on the right posterior base.  CARDIOVASCULAR: S1, S2 appreciated. Rhythm is regular. PMI is not lateralized. Chest is nontender to palpation. 1 + pedal pulses. No lower extremity edema, calf tenderness, or cyanosis was noted.  ABDOMEN: Soft, tender diffusely mostly in the lower part of abdomen suprapubic as well as bilateral lower right as well as left quadrant, but no rebound or guarding were noted. The patient does have ventral hernia which is palpated, which is somewhat tender to palpation, but I am able to reduce.  No hepatosplenomegaly or masses were noted. Bowel sounds were present, but diminished.  MUSCLE STRENGTH: Able to move all extremities. Has difficulty standing up or sitting up because of significant pain, discomfort. No cyanosis, degenerative joint disease, or kyphosis. Gait was not tested.  SKIN: Did not reveal any rashes, lesions, erythema, nodularity, or induration. It was warm and dry to palpation.  LYMPHATIC: No adenopathy in the cervical region.  NEUROLOGICAL: Cranial nerves grossly intact. Sensory is intact. No dysarthria or aphasia. The patient is alert, but very somnolent, intermittently she would drift down to sleep. She is also tearful whenever I examine her and palpate her abdomen.     LABORATORY DATA:  Glucose  level of 109, BUN of 19, sodium 135, otherwise BMP unremarkable. Lipase level is low at 68. Her liver enzymes were normal. White blood cell count is normal at 10.3, hemoglobin 11.3, platelet count 284,000. Coagulation panel, pro time is 13.7, INR 1.0, activated PTT 36.3. Urinalysis yellow clear urine, negative for glucose, bilirubin, or ketones, specific gravity 1.008, pH was 7.0, negative for  blood, protein, nitrites, or leukocyte esterase, red blood cells none, less than 1 white blood cell, no bacteria were seen.  Lactic acid level is 0.8.     RADIOLOGIC STUDIES: Chest portable single view x-ray 09/07/2014 revealed mild CHF. CT scan of abdomen and pelvis with contrast on 09/07/2014 showed changes consistent with cholecystectomy and biliary ductal dilatation, distended bladder with distended collecting systems and ureters bilaterally.  This may simply be related to distended bladder. Decompression of the bladder is recommended, followup ultrasound may be helpful to assess the degree of residual hydronephrosis, correlation with the patient's laboratory values was recommended as well, stable anterior abdominal wall hernia containing bowel although no incarceration was noted, post surgical changes were seen in colon.   ASSESSMENT AND PLAN:  1.  Acute urinary bladder obstruction of unclear etiology at this time. Place Foley catheter, drain, get ultrasound tomorrow to document urinary decompression, and get urology involved.  2.  Hyponatremia, following in the morning.  3.  Hypoxia, likely combination of mild congestive heart failure seen on chest x-ray as well as likely obstructive sleep apnea presentation after the patient was given morphine.  We will continue oxygen therapy and will get echocardiogram. I will try to wean her off oxygen.  4.  Hypertension. We will continue home medications and will follow blood pressure readings, advance him as needed although the patient is hypotensive at present.  5.  Rectal bleed. We will get gastroenterology consultation and get bleeding scan if it recurs. We will initiate the patient on PPIs  TIME SPENT: 50 minutes.    ____________________________ Theodoro Grist, MD rv:bu D: 09/07/2014 14:55:00 ET T: 09/07/2014 16:05:15 ET JOB#: [id number]  cc: Theodoro Grist, MD, <Dictator> Cyndi Bender, MD [place] MD ELECTRONICALLY SIGNED 09/26/2014 14:26

## 2014-11-11 NOTE — Consult Note (Signed)
PATIENT NAME:  Alison Walker, Alison Walker MR#:  [id number] DATE OF BIRTH:  19-Jul-1960  DATE OF CONSULTATION:  09/07/2014  REFERRING PHYSICIAN:  Theodoro Grist, MD CONSULTING PHYSICIAN:  Andria Meuse, NP  PRIMARY CARE PHYSICIAN: Dr. Tobie Lords in [place].   CONSULTING GASTROENTEROLOGIST: Lucilla Lame, MD   HISTORY OF PRESENT ILLNESS: Alison Walker is a 54 year old female who was admitted to the hospital with abdominal pain. We had actually seen her on admission back in June 2014 when she had acute enterocolitis felt to be ischemic. She had a CT scan of the abdomen and pelvis which showed status post cholecystectomy with biliary physiologic dilation, distended bladder, collecting system, and ureters, and stable anterior abdominal wall hernia without incarceration. Her LFTs and lipase were normal. Her hemoglobin was 11.3, hematocrit 34. White blood cell count and platelets are normal. Her INR is 1. Her lactic acid was normal. She is quite lethargic as I am trying to do her exam. She tells me she has not taken any medications or over-the-counter nonsteroidal anti-inflammatory drugs. She was supposed to have a surgical followup at [facility] for her ventral hernia, but she is unsure whether she has been to that appointment and really is unable answer most of my questions this afternoon. She tells me last week she started to have abdominal pain. The pain is 10/10 at worst. She tells me she has not seen rectal bleeding since she was shopping for Christmas at [facility] She then tells me she saw rectal bleeding and had severe proctalgia with defecation yesterday. She denies any diarrhea or constipation. She tells the nurse that she may have taken multiple Vicodin at home, but then she later says that she did not do this.   She has a history of Clostridium difficile colitis in September 2013. She had a negative PCR in June 2014. Her last colonoscopy was by Dr. Candace Cruise 08/18/2012, in which she had a poor preparation; otherwise, normal  colonoscopy with normal random biopsies and melanosis coli. She had an incomplete poor preparation colonoscopy the day prior to that. In October 2013 she had an EGD which showed LA grade A reflux esophagitis. In September 2013, she had a flexible sigmoidoscopy for severe ischemic colitis with diffuse inflammation in the sigmoid and descending colon. At that time, her urine drug screen was positive for cannabinoids, methadone she was taking from her friend, and benzodiazepines.   PAST MEDICAL AND SURGICAL HISTORY: Ischemic colitis in July 2014 and September 2013. Last colonoscopy as per HPI. She has chronic pain syndrome, hypertension, gastroesophageal reflux disease, chronic ventral hernia, a history of WPW, Clostridium difficile colitis in September 2013, COPD, congestive heart failure. She had a colostomy secondary to recurrent diverticulitis and reversal. Bilateral salpingo-oophorectomy with hysterectomy, cholecystectomy, back surgery, multiple DNCs and C-sections.   MEDICATIONS PRIOR TO ADMISSION: Acetaminophen/hydrocodone 325/5 mg t.i.d. p.r.n., alprazolam 1 mg b.i.d., atenolol 50 mg daily, cyclobenzaprine 10 mg b.i.d., dicyclomine 10 mg t.i.d., escitalopram 20 mg daily, lisinopril 10 mg daily, loratadine 10 mg p.r.n., omeprazole 40 mg b.i.d.   ALLERGIES: CIPRO CAUSES HIVES AND MORPHINE CAUSES HIVES.   FAMILY HISTORY: There is no known family history of colorectal carcinoma, liver or chronic GI problems, other than a brother with hepatitis C.   SOCIAL HISTORY: She has 3 healthy children. She has history of tobacco use, marijuana use, and illicit drug use. She denies any alcohol use.   REVIEW OF SYSTEMS: CONSTITUTIONAL: She has fatigue, malaise.  GENITOURINARY: She has increased urinary frequency and dysuria.  MUSCULOSKELETAL:  She has chronic back pain.  Otherwise, negative complete review of systems.   PHYSICAL EXAMINATION:  VITAL SIGNS: BMI is 29.4, weight is 160.3, height is 61.9 inches.   GENERAL: She is awake, however, appears lethargic. She is confused at times.  HEENT: Sclerae clear, anicteric. Conjunctivae pink. Oropharynx pink and moist without lesions.  NECK: Supple without any mass or thyromegaly.  CHEST: Heart regular rate and rhythm. Normal S1, S2 without any murmurs, clicks, rubs, or gallops.  LUNGS: Clear to auscultation bilaterally.  ABDOMEN: Positive bowel sounds x 4. No bruits auscultated. Abdomen is soft, nontender, nondistended, without palpable mass or hepatosplenomegaly. No rebound tenderness or guarding.  RECTAL: No external lesions visualized. She had a very large fecal impaction, which was unable to be disrupted by digital exam. A small amount of medium brown stool was obtained from the vault, which was Hemoccult negative. No gross GI bleeding noted. No gross rectal bleeding noted.  EXTREMITIES: Without clubbing or edema.  SKIN: Pink, warm, and dry without any rash or jaundice.  NEUROLOGIC: She is confused at times but otherwise grossly intact.  MUSCULOSKELETAL: She has good equal movement and strength bilaterally.  SKIN: Pink, warm, and dry without any rash or jaundice.   LABORATORY STUDIES: See HPI. Glucose 109, BUN 19, sodium 135, lipase 68, otherwise normal basic metabolic panel. LFTs normal.   IMPRESSION: Alison Walker is a 54 year old female admitted with abdominal pain and reports rectal bleeding. She has mild anemia. She has a significant fecal impaction and Hemoccult negative stool on exam today. The patient unable to describe rectal bleeding to me at this time as she appears sedated and previously received Narcan. Her last normal colonoscopy was February 2014 by Dr. Candace Cruise, and she had a poor preparation, but the exam was otherwise normal. She has history of ischemic colitis, Clostridium difficile colitis, recurrent diverticulitis requiring partial colectomy in 1998, and ventral hernia. No active lower GI bleed at this time on digital rectal exam.   PLAN:   1.  Colace b.i.d.  2.  Soap suds enema today with fecal impaction disimpaction.  3.  MiraLax 17 grams b.i.d.  4.  Follow hemoglobin and hematocrit and consider bleeding scan or possible colonoscopy if bleeding returns.   Thank you for allowing [place] to participate in her care.    ____________________________ Andria Meuse, NP klj:at D: 09/07/2014 17:13:05 ET T: 09/07/2014 17:26:43 ET JOB#: [id number]  cc: Andria Meuse, NP, <Dictator> Theodoro Grist, MD Dr. Rolla Etienne FNP ELECTRONICALLY SIGNED 09/13/2014 21:13

## 2014-11-11 NOTE — Consult Note (Signed)
Chief Complaint:  Subjective/Chief Complaint Heme negative with a report of rectal bleeding on admission. No further bleeding and patient eating well. Hb down today 1 gram.   VITAL SIGNS/ANCILLARY NOTES: **Vital Signs.:   27-Feb-16 05:58  Vital Signs Type Routine  Temperature Temperature (F) 97.9  Celsius 36.6  Temperature Source oral  Pulse Pulse 70  Respirations Respirations 18  Systolic BP Systolic BP 91  Diastolic BP (mmHg) Diastolic BP (mmHg) 48  Mean BP 62  Pulse Ox % Pulse Ox % 94  Pulse Ox Activity Level  At rest  Oxygen Delivery Room Air/ 21 %   Brief Assessment:  GEN well developed, well nourished, no acute distress   Respiratory normal resp effort  no use of accessory muscles   Additional Physical Exam Alert and orientated times 3   Lab Results: Routine Chem:  27-Feb-16 00:59   Glucose, Serum  109  BUN  19  Creatinine (comp) 0.97  Sodium, Serum 139  Potassium, Serum 3.9  Chloride, Serum 105  CO2, Serum 27  Calcium (Total), Serum  8.3  Anion Gap 7  Osmolality (calc) 280  eGFR (African American) >60  eGFR (Non-African American) >60 (eGFR values <68m/min/1.73 m2 may be an indication of chronic kidney disease (CKD). Calculated eGFR, using the MRDR Study equation, is useful in  patients with stable renal function. The eGFR calculation will not be reliable in acutely ill patients when serum creatinine is changing rapidly. It is not useful in patients on dialysis. The eGFR calculation may not be applicable to patients at the low and high extremes of body sizes, pregnant women, and vegetarians.)  Cardiac:  27-Feb-16 00:59   Troponin I < 0.02 (0.00-0.05 0.05 ng/mL or less: NEGATIVE  Repeat testing in 3-6 hrs  if clinically indicated. >0.05 ng/mL: POTENTIAL  MYOCARDIAL INJURY. Repeat  testing in 3-6 hrs if  clinically indicated. NOTE: An increase or decrease  of 30% or more on serial  testing suggests a  clinically important change)  CPK-MB, Serum   6.2 (Result(s) reported on 08 Sep 2014 at 01:55AM.)  Routine Hem:  27-Feb-16 00:59   WBC (CBC) 9.2  RBC (CBC)  3.27  Hemoglobin (CBC)  10.3  Hematocrit (CBC)  32.1  Platelet Count (CBC) 256  MCV 98  MCH 31.6  MCHC 32.2  RDW 14.3  Neutrophil % 65.4  Lymphocyte % 21.1  Monocyte % 11.3  Eosinophil % 1.8  Basophil % 0.4  Neutrophil # 6.0  Lymphocyte # 1.9  Monocyte #  1.0  Eosinophil # 0.2  Basophil # 0.0 (Result(s) reported on 08 Sep 2014 at 01:54AM.)   Assessment/Plan:  Assessment/Plan:  Assessment Rectal bleeding. Stool impaction.   Plan Heme negative. Moving bowels now and no further report of bleeding. Nothing further from a GI point of view.   Electronic Signatures: WLucilla Lame(MD)  (Signed [id number])  Authored: Chief Complaint, VITAL SIGNS/ANCILLARY NOTES, Brief Assessment, Lab Results, Assessment/Plan   Last Updated: 27-Feb-16 09:52 by WLucilla Lame(MD)

## 2014-11-11 NOTE — Discharge Summary (Signed)
PATIENT NAME:  Alison Walker, Alison Walker MR#:  [id number] DATE OF BIRTH:  Feb 08, 1961  DATE OF ADMISSION:  09/07/2014 DATE OF DISCHARGE:  09/08/2014  DISCHARGE DIAGNOSIS: Bladder outlet obstruction secondary to severe fecal impaction.  PROCEDURES: Fecal disimpaction.  CONSULTS: Urology.  BRIEF HISTORY AND PHYSICAL AND HOSPITAL COURSE BASED ON THE PROBLEM:  1.  Bladder outlet obstruction. The patient is a 54 year old Caucasian female with a history of sigmoid colitis came into the ED with chief complaint of abdominal pain. Please review history and physical dictated by Dr. Ether Griffins for details. The pain was sharp and 10/10 in the suprapubic and lateral lower quadrant abdominal pain. She noted bright red blood per rectum. Initial blood pressure was 88/50 , IV fluids were given. Narcan was given and hospitalist team is called to admit the patient. For acute bladder outlet obstruction, Foley catheter was placed and renal ultrasound was ordered. Urology consult was placed. Eventually, we came to know that the patient was severely constipated and did not have any good bowel movements for the past several days. Eventually, the patient was given bowel regimen and fecal disimpaction was performed by the registered nurse, following which the patient had significant amount bowel movement, following which her abdominal pain was resolved. Flank pain was also resolved. Foley catheter was discontinued and post void residual trials were done as recommended by urology. Renal ultrasound did not reveal any hydronephrosis. The bladder outlet obstruction was thought to be from severe fecal impaction, which was resolved after fecal disimpaction and a good bowel movement. The patient's post void residual of her bladder scan was approximately 30 mL x2. As the patient was feeling better, decision was made to discharge the patient home from a medical and urology standpoint.  2.  Rectal bleed. It was thought to be from severe constipation and  straining. GI consult was placed and the patient was evaluated by gastrointestinal. As hemoglobin was stable, which obviously has dropped from 11 to 10, which was presumed to be from hemodilution and lab work done in the hospital stay. Bowel regimen was provided. No other episodes of rectal bleed were noticed.  3. Hypoxia  seem to be from anxiety. There was a concern for CHF at the time of admission, but hypoxia though to be from component of obstructive sleep apnea and also from anxiety as well as from morphine given in the ED at the time of arrival. Echocardiogram was normal, and the patient's hypoxia was resolved by next day. Congestive heart failure  was ruled out, after discussing with Dr. Rockey Situ on curbside cardiology consult was cancelled. Her oxygen via nasal cannula was weaned off.  4.  Hypotension. The patient's atenolol was reduced to 25 mg.   Hospital course was uneventful.   CONDITION AT THE TIME OF DISCHARGE: Stable.   SIGNIFICANT LABORATORIES AND IMAGING STUDIES: Troponin less than 0.02 x3. BMP on 27th of February: Glucose 109, BUN 19, creatinine normal. Sodium and potassium, normal. Serum osmolality normal. Calcium 8.3. WBC 9.2, hemoglobin 10.3, hematocrit 32.1, platelets are 256,000. Urine culture and blood culture no growth.   Echocardiogram: Left ventricular ejection fraction 60% -65%, normal study, normal global left ventricular systolic function, normal right ventricular systolic pressure   MEDICATIONS AT THE TIME OF DISCHARGE: Lisinopril 10 mg p.o. once daily, omeprazole 40 mg p.o. 2 times a day, alprazolam 1 mg p.o. 2 times a day as needed for anxiety, acetaminophen with hydrocodone 25/5 one tablet p.o. 3 times a day as needed for pain, cyclobenzaprine 10 mg p.o.  2 times a day, escitalopram 20 mg 1 tablet p.o. once daily, dicyclomine 10 mg 1 capsule p.o. 3 times a day, loratadine 10 mg 1 tablet p.o. once a day as needed, atenolol 20 mg p.o. once daily, polyethylene glycol 17 grams  p.o. 2 times a day for constipation, Colace 100 mg p.o. 2 times a day.   DIET: Low sodium diet.   ACTIVITY: As tolerated.   FOLLOWUP: With primary care physician in a week. Urology in 1 to 2 weeks as needed. Followup with GI as needed if the rectal bleed recurs. If it is severe rectal bleed, the patient was instructed to come to the ED. She is full code. Plan of care discussed with the bed. She verbalized understanding of the plan.   TOTAL TIME SPENT ON THE DISCHARGE: Forty-five minutes.    ____________________________ Nicholes Mango, MD ag:TM D: 09/13/2014 15:46:16 ET T: 09/13/2014 23:45:13 ET JOB#: [id number]  cc: Nicholes Mango, MD, <Dictator> Nicholes Mango MD ELECTRONICALLY SIGNED 09/25/2014 20:42

## 2014-11-19 ENCOUNTER — Telehealth: Payer: Self-pay | Admitting: *Deleted

## 2014-11-19 NOTE — Telephone Encounter (Signed)
Pt states the toe and the side of her foot has worsened and she would like to discuss changing the surgery date.

## 2014-11-21 ENCOUNTER — Ambulatory Visit: Payer: Medicaid Other | Admitting: Podiatry

## 2015-01-07 ENCOUNTER — Ambulatory Visit: Payer: Medicaid Other | Admitting: Podiatry

## 2015-03-19 ENCOUNTER — Emergency Department: Payer: Medicaid Other

## 2015-03-19 ENCOUNTER — Encounter: Payer: Self-pay | Admitting: Emergency Medicine

## 2015-03-19 ENCOUNTER — Emergency Department
Admission: EM | Admit: 2015-03-19 | Discharge: 2015-03-19 | Disposition: A | Discharge status: Home / Self Care | Payer: Medicaid Other | Attending: Emergency Medicine | Admitting: Emergency Medicine

## 2015-03-19 DIAGNOSIS — M79674 Pain in right toe(s): Secondary | ICD-10-CM

## 2015-03-19 DIAGNOSIS — Z79899 Other long term (current) drug therapy: Secondary | ICD-10-CM | POA: Insufficient documentation

## 2015-03-19 DIAGNOSIS — I10 Essential (primary) hypertension: Secondary | ICD-10-CM | POA: Insufficient documentation

## 2015-03-19 DIAGNOSIS — Z79891 Long term (current) use of opiate analgesic: Secondary | ICD-10-CM | POA: Insufficient documentation

## 2015-03-19 DIAGNOSIS — Y998 Other external cause status: Secondary | ICD-10-CM | POA: Insufficient documentation

## 2015-03-19 DIAGNOSIS — W228XXA Striking against or struck by other objects, initial encounter: Secondary | ICD-10-CM | POA: Insufficient documentation

## 2015-03-19 DIAGNOSIS — Y9289 Other specified places as the place of occurrence of the external cause: Secondary | ICD-10-CM | POA: Insufficient documentation

## 2015-03-19 DIAGNOSIS — Z7982 Long term (current) use of aspirin: Secondary | ICD-10-CM | POA: Diagnosis not present

## 2015-03-19 DIAGNOSIS — Z720 Tobacco use: Secondary | ICD-10-CM | POA: Insufficient documentation

## 2015-03-19 DIAGNOSIS — Z79811 Long term (current) use of aromatase inhibitors: Secondary | ICD-10-CM | POA: Diagnosis not present

## 2015-03-19 DIAGNOSIS — S99921A Unspecified injury of right foot, initial encounter: Secondary | ICD-10-CM | POA: Diagnosis not present

## 2015-03-19 DIAGNOSIS — Y9389 Activity, other specified: Secondary | ICD-10-CM | POA: Diagnosis not present

## 2015-03-19 MED ORDER — TRAMADOL HCL 50 MG PO TABS: 50.0000 mg | ORAL_TABLET | Freq: Four times a day (QID) | ORAL | Status: DC | PRN

## 2015-03-19 MED ORDER — TRAMADOL HCL 50 MG PO TABS
50.0000 mg | ORAL_TABLET | Freq: Once | ORAL | Status: AC
  Administered 2015-03-19: 50 mg via ORAL
  Filled 2015-03-19: qty 1

## 2015-03-19 NOTE — ED Provider Notes (Signed)
[facility] Emergency Department Provider Note  Time seen: 10:00 PM  I have reviewed the triage vital signs and the nursing notes.   HISTORY  Chief Complaint Foot Pain    HPI Alison Walker is a 54 y.o. female with a past medical history of hypertension, diverticulitis, Wolff-Parkinson-White status post ablation, who presents the emergency department for right great toe pain. According to the patient she hit the toe twice today. Describes pain in her great toe extending to her first metatarsal. Denies any other injuries. Denies falling, denies head injury, denies loss consciousness. Describes a pain as moderate, dull/aching worse with ambulation.     Past Medical History  Diagnosis Date  . Hypertension   . Diverticulitis   . S/P ablation operation for arrhythmia     for WPW  . Wolf-Parkinson-White syndrome     There are no active problems to display for this patient.   Past Surgical History  Procedure Laterality Date  . Abdominal surgery    . Colonoscopy    . Colostomy    . Colostomy revision    . Cholecystectomy    . Tonsillectomy      Current Outpatient Rx  Name  Route  Sig  Dispense  Refill  . ALPRAZolam (XANAX) 0.5 MG tablet   Oral   Take 0.5 mg by mouth at bedtime as needed for anxiety.         Marland Kitchen aspirin EC 81 MG tablet   Oral   Take 81 mg by mouth.         Marland Kitchen atenolol (TENORMIN) 50 MG tablet   Oral   Take 50 mg by mouth.         Marland Kitchen atorvastatin (LIPITOR) 40 MG tablet   Oral   Take 40 mg by mouth daily.         . cyclobenzaprine (FLEXERIL) 10 MG tablet   Oral   Take 10 mg by mouth.         . desipramine (NORPRAMIN) 50 MG tablet   Oral   Take 50 mg by mouth.         . dicyclomine (BENTYL) 20 MG tablet   Oral   Take 20 mg by mouth every 6 (six) hours.         Marland Kitchen escitalopram (LEXAPRO) 20 MG tablet   Oral   Take 20 mg by mouth daily.         Marland Kitchen HYDROcodone-acetaminophen (NORCO/VICODIN) 5-325 MG per  tablet   Oral   Take 1 tablet by mouth every 6 (six) hours as needed for moderate pain.         Marland Kitchen lisinopril (PRINIVIL,ZESTRIL) 10 MG tablet   Oral   Take 10 mg by mouth.         Marland Kitchen omeprazole (PRILOSEC) 40 MG capsule   Oral   Take 40 mg by mouth daily.         Marland Kitchen oxyCODONE-acetaminophen (PERCOCET/ROXICET) 5-325 MG per tablet   Oral   Take by mouth.         . predniSONE (DELTASONE) 20 MG tablet      3 po daily for 2 days, then 2 po daily for 2 days, then 1 po daily for 2 days         . Sennosides (SENNA) 8.6 MG CAPS   Oral   Take by mouth.         . terbinafine (LAMISIL) 250 MG tablet   Oral   Take  250 mg by mouth daily.           Allergies Ciprofloxacin and Morphine  Family History  Problem Relation Age of Onset  . Cancer Mother   . Cancer Other     Social History Social History  Substance Use Topics  . Smoking status: Current Every Day Smoker  . Smokeless tobacco: Never Used  . Alcohol Use: No    Review of Systems Constitutional: Negative for fever. Cardiovascular: Negative for chest pain. Respiratory: Negative for shortness of breath. Gastrointestinal: Negative for abdominal pain Musculoskeletal: Right great toe pain. Skin: Negative for rash. 10-point ROS otherwise negative.  ____________________________________________   PHYSICAL EXAM:  VITAL SIGNS: ED Triage Vitals  Enc Vitals Group     BP 03/19/15 2029 111/57 mmHg     Pulse Rate 03/19/15 2029 79     Resp 03/19/15 2029 20     Temp 03/19/15 2029 98.6 F (37 C)     Temp Source 03/19/15 2029 Oral     SpO2 03/19/15 2029 100 %     Weight 03/19/15 2029 148 lb (67.132 kg)     Height 03/19/15 2029 5\' 2"  (1.575 m)     Head Cir --      Peak Flow --      Pain Score 03/19/15 2030 7     Pain Loc --      Pain Edu? --      Excl. in [place]? --     Constitutional: Alert and oriented. Well appearing and in no distress. Eyes: Normal exam ENT   Head: Normocephalic and  atraumatic. Cardiovascular: Normal rate, regular rhythm. No murmur Respiratory: Normal respiratory effort without tachypnea nor retractions. Breath sounds are clear and equal bilaterally. No wheezes/rales/rhonchi. Gastrointestinal: Soft and nontender. No distention.   Musculoskeletal: Mild to moderate tenderness of the right great toe. No erythema noted. No abrasions or lacerations. Neurologic:  Normal speech and language. No gross focal neurologic deficits  Skin:  Skin is warm, dry and intact.  Psychiatric: Mood and affect are normal. Speech and behavior are normal.  ____________________________________________   INITIAL IMPRESSION / ASSESSMENT AND PLAN / ED COURSE  Pertinent labs & imaging results that were available during my care of the patient were reviewed by me and considered in my medical decision making (see chart for details).  Patient right great toe pain after hitting it twice today. X-rays negative. Will discharge with a short course of Ultram and primary care follow-up. Patient agreeable plan.  ____________________________________________   FINAL CLINICAL IMPRESSION(S) / ED DIAGNOSES  Right great toe pain   Harvest Dark, MD 03/19/15 2203

## 2015-03-19 NOTE — Discharge Instructions (Signed)

## 2015-03-19 NOTE — ED Notes (Signed)
Patient to ED with c/o right foot pain, reports she has injured foot in past and hit it today and pain became more severe.

## 2015-03-20 ENCOUNTER — Ambulatory Visit: Payer: Self-pay | Admitting: Unknown Physician Specialty

## 2015-03-25 ENCOUNTER — Emergency Department: Payer: Medicaid Other

## 2015-03-25 ENCOUNTER — Emergency Department
Admission: EM | Admit: 2015-03-25 | Discharge: 2015-03-25 | Disposition: A | Discharge status: Other Acute Inpatient Hospital | Payer: Medicaid Other | Attending: Emergency Medicine | Admitting: Emergency Medicine

## 2015-03-25 ENCOUNTER — Encounter: Payer: Self-pay | Admitting: Emergency Medicine

## 2015-03-25 DIAGNOSIS — R109 Unspecified abdominal pain: Secondary | ICD-10-CM | POA: Insufficient documentation

## 2015-03-25 DIAGNOSIS — I10 Essential (primary) hypertension: Secondary | ICD-10-CM | POA: Diagnosis not present

## 2015-03-25 DIAGNOSIS — R1110 Vomiting, unspecified: Secondary | ICD-10-CM | POA: Diagnosis not present

## 2015-03-25 DIAGNOSIS — Z7982 Long term (current) use of aspirin: Secondary | ICD-10-CM | POA: Diagnosis not present

## 2015-03-25 DIAGNOSIS — Z720 Tobacco use: Secondary | ICD-10-CM | POA: Diagnosis not present

## 2015-03-25 DIAGNOSIS — Z79899 Other long term (current) drug therapy: Secondary | ICD-10-CM | POA: Diagnosis not present

## 2015-03-25 LAB — CBC WITH DIFFERENTIAL/PLATELET
Basophils Absolute: 0.1 10*3/uL (ref 0–0.1)
Basophils Relative: 1 %
Eosinophils Absolute: 0.1 10*3/uL (ref 0–0.7)
Eosinophils Relative: 2 %
HCT: 39.8 % (ref 35.0–47.0)
Hemoglobin: 13.2 g/dL (ref 12.0–16.0)
Lymphocytes Relative: 29 %
Lymphs Abs: 2.3 10*3/uL (ref 1.0–3.6)
MCH: 31.6 pg (ref 26.0–34.0)
MCHC: 33.3 g/dL (ref 32.0–36.0)
MCV: 95 fL (ref 80.0–100.0)
Monocytes Absolute: 0.5 10*3/uL (ref 0.2–0.9)
Monocytes Relative: 6 %
Neutro Abs: 5.1 10*3/uL (ref 1.4–6.5)
Neutrophils Relative %: 62 %
Platelets: 356 10*3/uL (ref 150–440)
RBC: 4.19 MIL/uL (ref 3.80–5.20)
RDW: 14.5 % (ref 11.5–14.5)
WBC: 8.1 10*3/uL (ref 3.6–11.0)

## 2015-03-25 LAB — URINALYSIS COMPLETE WITH MICROSCOPIC (ARMC ONLY)
Bacteria, UA: NONE SEEN
Bilirubin Urine: NEGATIVE
Glucose, UA: NEGATIVE mg/dL
Hgb urine dipstick: NEGATIVE
Ketones, ur: NEGATIVE mg/dL
Leukocytes, UA: NEGATIVE
Nitrite: NEGATIVE
Protein, ur: NEGATIVE mg/dL
RBC / HPF: NONE SEEN RBC/hpf (ref 0–5)
Specific Gravity, Urine: 1.003 — ABNORMAL LOW (ref 1.005–1.030)
pH: 6 (ref 5.0–8.0)

## 2015-03-25 LAB — COMPREHENSIVE METABOLIC PANEL
ALT: 19 U/L (ref 14–54)
AST: 22 U/L (ref 15–41)
Albumin: 4.1 g/dL (ref 3.5–5.0)
Alkaline Phosphatase: 70 U/L (ref 38–126)
Anion gap: 7 (ref 5–15)
BUN: 11 mg/dL (ref 6–20)
CO2: 26 mmol/L (ref 22–32)
Calcium: 9.2 mg/dL (ref 8.9–10.3)
Chloride: 105 mmol/L (ref 101–111)
Creatinine, Ser: 0.62 mg/dL (ref 0.44–1.00)
GFR calc Af Amer: >60 mL/min (ref >60)
GFR calc non Af Amer: >60 mL/min (ref >60)
Glucose, Bld: 87 mg/dL (ref 65–99)
Potassium: 3.6 mmol/L (ref 3.5–5.1)
Sodium: 138 mmol/L (ref 135–145)
Total Bilirubin: 0.5 mg/dL (ref 0.3–1.2)
Total Protein: 7.8 g/dL (ref 6.5–8.1)

## 2015-03-25 LAB — LIPASE, BLOOD: Lipase: 14 U/L — ABNORMAL LOW (ref 22–51)

## 2015-03-25 MED ORDER — HYDROMORPHONE HCL 1 MG/ML IJ SOLN
0.5000 mg | Freq: Once | INTRAMUSCULAR | Status: AC
  Administered 2015-03-25: 0.5 mg via INTRAVENOUS

## 2015-03-25 MED ORDER — DIPHENHYDRAMINE HCL 25 MG PO CAPS
25.0000 mg | ORAL_CAPSULE | Freq: Once | ORAL | Status: AC
Start: 2015-03-25 — End: 2015-03-25
  Administered 2015-03-25: 25 mg via ORAL

## 2015-03-25 MED ORDER — HYDROMORPHONE HCL 1 MG/ML PO LIQD: 0.5000 mg | Freq: Once | ORAL | Status: DC

## 2015-03-25 MED ORDER — IOHEXOL 240 MG/ML SOLN
25.0000 mL | Freq: Once | INTRAMUSCULAR | Status: AC | PRN
Start: 2015-03-25 — End: 2015-03-25
  Administered 2015-03-25: 25 mL via INTRAVENOUS

## 2015-03-25 MED ORDER — IOHEXOL 300 MG/ML  SOLN
100.0000 mL | Freq: Once | INTRAMUSCULAR | Status: AC | PRN
  Administered 2015-03-25: 100 mL via INTRAVENOUS

## 2015-03-25 MED ORDER — MORPHINE SULFATE (PF) 4 MG/ML IV SOLN
INTRAVENOUS | Status: AC
  Filled 2015-03-25: qty 1

## 2015-03-25 MED ORDER — MORPHINE SULFATE (PF) 2 MG/ML IV SOLN
INTRAVENOUS | Status: DC
Start: 2015-03-25 — End: 2015-03-25
  Filled 2015-03-25: qty 1

## 2015-03-25 MED ORDER — ONDANSETRON HCL 4 MG/2ML IJ SOLN
4.0000 mg | Freq: Once | INTRAMUSCULAR | Status: AC
  Administered 2015-03-25: 4 mg via INTRAVENOUS
  Filled 2015-03-25: qty 2

## 2015-03-25 MED ORDER — DIPHENHYDRAMINE HCL 25 MG PO CAPS
ORAL_CAPSULE | ORAL | Status: AC
  Administered 2015-03-25: 25 mg via ORAL
  Filled 2015-03-25: qty 1

## 2015-03-25 MED ORDER — HYDROMORPHONE HCL 1 MG/ML IJ SOLN
INTRAMUSCULAR | Status: AC
  Administered 2015-03-25: 0.5 mg via INTRAVENOUS
  Filled 2015-03-25: qty 1

## 2015-03-25 MED ORDER — MORPHINE SULFATE (PF) 4 MG/ML IV SOLN
4.0000 mg | Freq: Once | INTRAVENOUS | Status: AC
  Administered 2015-03-25: 4 mg via INTRAVENOUS

## 2015-03-25 NOTE — ED Notes (Signed)
MD at bedside. 

## 2015-03-25 NOTE — ED Notes (Signed)
Pt here with generalized abd pain, pt states that she had surgery in June at [facility] and had mesh placed. Pt states that pt in generalized across her abd.  Pt has 3 hernia's repaired.  Pt denies vomiting and diarrhea. Pt states that she has had nausea and that she has been taking phenergan. Pt was ambulatory to triage room and pt in NAD at this time.

## 2015-03-25 NOTE — ED Provider Notes (Signed)
[facility] Emergency Department Provider Note    ____________________________________________  Time seen: 1735  I have reviewed the triage vital signs and the nursing notes.   HISTORY  Chief Complaint Abdominal Pain   History limited by: Not Limited   HPI Alison Walker is a 54 y.o. female who presents to the emergency department today with abdominal pain. She states this pain is severe. It is located in the central part of her stomach. She states that the pain started yesterday and is progressively gotten worse. She did have a history of a hernia repair with mesh in July. She states that since that surgery she has been doing okay and hasn't had any significant complications. States her last bowel movement was yesterday. She has been passing gas today. She has been nauseous today. She has vomited.     Past Medical History  Diagnosis Date  . Hypertension   . Diverticulitis   . S/P ablation operation for arrhythmia     for WPW  . Wolf-Parkinson-White syndrome     There are no active problems to display for this patient.   Past Surgical History  Procedure Laterality Date  . Abdominal surgery    . Colonoscopy    . Colostomy    . Colostomy revision    . Cholecystectomy      Current Outpatient Rx  Name  Route  Sig  Dispense  Refill  . ALPRAZolam (XANAX) 0.5 MG tablet   Oral   Take 0.5 mg by mouth at bedtime as needed for anxiety.         Marland Kitchen aspirin EC 81 MG tablet   Oral   Take 81 mg by mouth.         Marland Kitchen atenolol (TENORMIN) 50 MG tablet   Oral   Take 50 mg by mouth.         Marland Kitchen atorvastatin (LIPITOR) 40 MG tablet   Oral   Take 40 mg by mouth daily.         . cyclobenzaprine (FLEXERIL) 10 MG tablet   Oral   Take 10 mg by mouth.         . desipramine (NORPRAMIN) 50 MG tablet   Oral   Take 50 mg by mouth.         . dicyclomine (BENTYL) 20 MG tablet   Oral   Take 20 mg by mouth every 6 (six) hours.         Marland Kitchen  escitalopram (LEXAPRO) 20 MG tablet   Oral   Take 20 mg by mouth daily.         Marland Kitchen HYDROcodone-acetaminophen (NORCO/VICODIN) 5-325 MG per tablet   Oral   Take 1 tablet by mouth every 6 (six) hours as needed for moderate pain.         Marland Kitchen lisinopril (PRINIVIL,ZESTRIL) 10 MG tablet   Oral   Take 10 mg by mouth.         Marland Kitchen omeprazole (PRILOSEC) 40 MG capsule   Oral   Take 40 mg by mouth daily.         Marland Kitchen oxyCODONE-acetaminophen (PERCOCET/ROXICET) 5-325 MG per tablet   Oral   Take by mouth.         . predniSONE (DELTASONE) 20 MG tablet      3 po daily for 2 days, then 2 po daily for 2 days, then 1 po daily for 2 days         . Sennosides (SENNA) 8.6 MG  CAPS   Oral   Take by mouth.         . terbinafine (LAMISIL) 250 MG tablet   Oral   Take 250 mg by mouth daily.         . traMADol (ULTRAM) 50 MG tablet   Oral   Take 1 tablet (50 mg total) by mouth every 6 (six) hours as needed.   12 tablet   0     Allergies Ciprofloxacin and Morphine  Family History  Problem Relation Age of Onset  . Cancer Mother   . Cancer Other     Social History Social History  Substance Use Topics  . Smoking status: Current Every Day Smoker  . Smokeless tobacco: Never Used  . Alcohol Use: No    Review of Systems  Constitutional: Negative for fever. Cardiovascular: Negative for chest pain. Respiratory: Negative for shortness of breath. Gastrointestinal: Positive for abdominal pain positive for emesis Genitourinary: Negative for dysuria. Musculoskeletal: Negative for back pain. Skin: Negative for rash. Neurological: Negative for headaches, focal weakness or numbness.  10-point ROS otherwise negative.  ____________________________________________   PHYSICAL EXAM:  VITAL SIGNS: ED Triage Vitals  Enc Vitals Group     BP 03/25/15 1457 122/62 mmHg     Pulse Rate 03/25/15 1457 91     Resp 03/25/15 1457 18     Temp 03/25/15 1457 98.3 F (36.8 C)     Temp Source  03/25/15 1457 Oral     SpO2 03/25/15 1457 100 %     Weight 03/25/15 1457 153 lb (69.4 kg)     Height --      Head Cir --      Peak Flow --      Pain Score 03/25/15 1657 8   Constitutional: Alert and oriented. Well appearing and in no distress. Eyes: Conjunctivae are normal. PERRL. Normal extraocular movements. ENT   Head: Normocephalic and atraumatic.   Nose: No congestion/rhinnorhea.   Mouth/Throat: Mucous membranes are moist.   Neck: No stridor. Hematological/Lymphatic/Immunilogical: No cervical lymphadenopathy. Cardiovascular: Normal rate, regular rhythm.  No murmurs, rubs, or gallops. Respiratory: Normal respiratory effort without tachypnea nor retractions. Breath sounds are clear and equal bilaterally. No wheezes/rales/rhonchi. Gastrointestinal: Soft. Surgical scars present and abdomen. Tender to palpation in the upper and mid abdomen. Some mild guarding. Genitourinary: Deferred Musculoskeletal: Normal range of motion in all extremities. No joint effusions.  No lower extremity tenderness nor edema. Neurologic:  Normal speech and language. No gross focal neurologic deficits are appreciated. Speech is normal.  Skin:  Skin is warm, dry and intact. No rash noted. Psychiatric: Mood and affect are normal. Speech and behavior are normal. Patient exhibits appropriate insight and judgment.  ____________________________________________    LABS (pertinent positives/negatives)  Labs Reviewed  LIPASE, BLOOD - Abnormal; Notable for the following:    Lipase 14 (*)    All other components within normal limits  URINALYSIS COMPLETEWITH MICROSCOPIC ([facility] ONLY) - Abnormal; Notable for the following:    Color, Urine COLORLESS (*)    APPearance CLEAR (*)    Specific Gravity, Urine 1.003 (*)    Squamous Epithelial / LPF 0-5 (*)    All other components within normal limits  CBC WITH DIFFERENTIAL/PLATELET  COMPREHENSIVE METABOLIC PANEL     ____________________________________________   EKG  I, Nance Pear, attending physician, personally viewed and interpreted this EKG  EKG Time: 1535 Rate: 88 Rhythm: NSR Axis: normal Intervals: qtc 454 QRS: narrow ST changes: no st elevation Impression: no st elevation  ____________________________________________    RADIOLOGY  CT abd/pel IMPRESSION: Evidence of patient's previous midline ventral hernia repair. Small fluid collection along the most inferior aspect of the surgical site measuring 1.2 x 2.7 cm likely noninfected postoperative collection.  Mild prominence of a small bowel loop measuring 3.1 cm in diameter with associated surgical clips over the midline lower abdomen. Findings may be due to ileus or postoperative change, although cannot exclude an early/partial obstructive process.  I, Jaquarious Grey, personally viewed and evaluated these images (CT scan) as part of my medical decision making.    ____________________________________________   PROCEDURES  Procedure(s) performed: None  Critical Care performed: No  ____________________________________________   INITIAL IMPRESSION / ASSESSMENT AND PLAN / ED COURSE  Pertinent labs & imaging results that were available during my care of the patient were reviewed by me and considered in my medical decision making (see chart for details).  Patient presented to the emergency department with concerns for abdominal pain. CT scan does show some dilated bowel concerning for ileus first partial/early obstruction. At this point I discussed with Dr. Dossie Der, surgeon at [facility], who will accept the patient in transfer. I discussed this in risk of transfer with patient.  ____________________________________________   FINAL CLINICAL IMPRESSION(S) / ED DIAGNOSES  Final diagnoses:  Abdominal pain, unspecified abdominal location     Nance Pear, MD 03/25/15 2049

## 2015-03-25 NOTE — ED Notes (Signed)
Pt unable to use bathroom at this time. Labeled cup and bag given to pt

## 2015-03-28 ENCOUNTER — Ambulatory Visit: Payer: Medicaid Other | Admitting: Podiatry

## 2015-04-30 ENCOUNTER — Telehealth: Payer: Self-pay | Admitting: *Deleted

## 2015-04-30 NOTE — Telephone Encounter (Signed)
Pt request to change appt.

## 2015-05-02 ENCOUNTER — Ambulatory Visit (INDEPENDENT_AMBULATORY_CARE_PROVIDER_SITE_OTHER): Payer: Medicaid Other | Admitting: Podiatry

## 2015-05-02 ENCOUNTER — Encounter: Payer: Self-pay | Admitting: Podiatry

## 2015-05-02 ENCOUNTER — Ambulatory Visit (INDEPENDENT_AMBULATORY_CARE_PROVIDER_SITE_OTHER): Payer: Medicaid Other

## 2015-05-02 VITALS — BP 121/70 | HR 83 | Resp 18

## 2015-05-02 DIAGNOSIS — M79673 Pain in unspecified foot: Secondary | ICD-10-CM

## 2015-05-02 DIAGNOSIS — M205X1 Other deformities of toe(s) (acquired), right foot: Secondary | ICD-10-CM | POA: Diagnosis not present

## 2015-05-02 DIAGNOSIS — Q828 Other specified congenital malformations of skin: Secondary | ICD-10-CM

## 2015-05-02 DIAGNOSIS — R52 Pain, unspecified: Secondary | ICD-10-CM

## 2015-05-03 ENCOUNTER — Telehealth: Payer: Self-pay | Admitting: *Deleted

## 2015-05-04 NOTE — Progress Notes (Signed)
Patient ID: JEFFREY VOTH, female   DOB: 05/09/1961, 54 y.o.   MRN: [id number]  Subjective: Patient presents the office they for continued pain to her right big toe joint. She states that she has extreme pain anytime she tries to move her toe. She also states that she did hit her toe couple days ago which is concerned may be broken. She denies any swelling or redness of the toe. She also states that she continues to have painful callus at the bottom of her left big toe as well as to her right submetatarsal 3. She says that despite recurring debridement they come back frequently. She is tried multiple steroid injections for the right big toe arthritis as well as offloading padding and shoe gear changes. She will discuss other options at this time. No other complaints at this time.  Objective: AAO 3, NAD DP/PT pulses 2/4, CRT less than 3 seconds Protective sensation intact with Derrel Nip monofilament There is very limited range of motion of the right first MTPJ with tenderness upon range of motion and there is crepitation present. There is palpable exostosis off the dorsal MPJ. There is no tenderness of the hallux. There is no open lesion or pre-ulcerative lesion and there is no evidence of puncture site for foreign body. Continuation of hyperkeratotic lesions left plantar hallux and right submetatarsal 3. Upon debridement there is no underlying ulceration, drainage or other signs of infection. No other areas of tenderness to bilateral lower extremity. No pain with calf compression, swelling, warmth, erythema.  Assessment: 54 year old female with right hallux rigidus, symptomatic hyperkeratotic lesions  Plan: -Treatment options discussed including all alternatives, risks, and complications -X-rays were obtained and reviewed with the patient.  -Etiology of symptoms were discussed -Hyperkeratotic lesions were sharply debrided 2 without complication/bleeding. -In regards the right hallux  rigidus she is having significant pain to this area. We have attempted multiple conservative treatments for this time I discussed surgical intervention. I discussed with her bone spur resection with likely first MPJ resection and implant. Also discussed the possible fusion or Jake Michaelis however she elects to proceed with implant. She wishes to proceed with surgery. I suggested for him by removal as there is deformity present the plantar hallux on x-ray. She would like to have this removed. I discussed that surgery may not alleviate all of her symptoms that she may have continued pain. She understands this. -The incision placement as well as the postoperative course was discussed with the patient. I discussed risks of the surgery which include, but not limited to, infection, bleeding, pain, swelling, need for further surgery, delayed or nonhealing, painful or ugly scar, numbness or sensation changes, over/under correction, recurrence, transfer lesions, further deformity, hardware failure, DVT/PE, loss of toe/foot. Patient understands these risks and wishes to proceed with surgery. The surgical consent was reviewed with the patient all 3 pages were signed. No promises or guarantees were given to the outcome of the procedure. All questions were answered to the best of my ability. Before the surgery the patient was encouraged to call the office if there is any further questions. The surgery will be performed at the [facility] on an outpatient basis.  Celesta Gentile, DPM

## 2015-05-06 NOTE — Telephone Encounter (Signed)
"  I'm calling for Dr. Jacqualyn Posey."

## 2015-05-06 NOTE — Telephone Encounter (Signed)
"  Dr. Jacqualyn Posey asked me to call you.  If you would give me a call back as soon as possible.  Thank you."

## 2015-05-06 NOTE — Telephone Encounter (Signed)
"  Dr. Jacqualyn Posey told me to call about surgery information.  Give me a call."

## 2015-05-09 NOTE — Telephone Encounter (Signed)
I'm returning your call.  I apologize for not calling you back until now.  I just received your information.  Would you like to proceed in scheduling surgery?  "Yes, I would.  What does he have available?"  He can do it on November 9, 16 or 23.  "Let's do it on November 9th."  Okay, I'll get it scheduled.  Surgical center will call you a day or two prior to with arrival time.  Do not eat or drink anything after midnight.  Go ahead and register with the surgical center, instructions are in your surgery brochure.  "I didn't get a brochure."  You can go by the [facility] office to get one or you can just call the surgical center at [phone].

## 2015-05-13 ENCOUNTER — Telehealth: Payer: Self-pay | Admitting: *Deleted

## 2015-05-13 NOTE — Telephone Encounter (Signed)
Pt states she is having a lot of pain in her feet.  Pt states her big toe is having shooting pain in the joint, can't even touching hurts.  I instructed pt to wear stiff sole shoe and ice, and rest.

## 2015-05-14 ENCOUNTER — Ambulatory Visit: Payer: Medicaid Other | Admitting: Unknown Physician Specialty

## 2015-05-14 ENCOUNTER — Telehealth: Payer: Self-pay | Admitting: *Deleted

## 2015-05-14 NOTE — Telephone Encounter (Signed)
That is good. I would like to hold off on anymore injections given her upcoming surgery. Thank you.

## 2015-05-15 NOTE — Telephone Encounter (Signed)
"  Patient is scheduled for surgery on November 9th.  Dr. Jacqualyn Posey said he needs a Silicone Implant.  Medicaid does not cover the implant.  Does he want to do surgery without the implant?"

## 2015-05-16 ENCOUNTER — Telehealth: Payer: Self-pay | Admitting: *Deleted

## 2015-05-16 ENCOUNTER — Other Ambulatory Visit: Payer: Self-pay | Admitting: Podiatry

## 2015-05-16 MED ORDER — HYDROCODONE-ACETAMINOPHEN 5-325 MG PO TABS: 1.0000 | ORAL_TABLET | ORAL | Status: DC | PRN

## 2015-05-16 NOTE — Telephone Encounter (Signed)
You can do vicodin 5/325 1 po q6 hr prn pain Disp #20

## 2015-05-16 NOTE — Telephone Encounter (Signed)
I attempted to call patient.  She needs to schedule an appointment with Dr. Jacqualyn Posey.  I left a message on her home phone to call me back.

## 2015-05-16 NOTE — Telephone Encounter (Addendum)
Pt states her feet still hurt, that the bone in her foot is hitting a nerve every time it touches anything, and would like something for the pain.  Informed pt she would be able to pick the Vicodin up in the [place] office before [place] called states pt just received 30 day supply of Hydrocodone from Dr. Ali Lowe.  I told the pharmacist to hold Dr. Leigh Aurora rx and I would inform him.

## 2015-05-16 NOTE — Telephone Encounter (Signed)
She needs to come back into the office. We will have to change the procedure and I need to talk to her about it. See if she can come today or Tuesday. I will likely just change it to a keller without implant. Thanks.

## 2015-05-16 NOTE — Telephone Encounter (Signed)
"  I'm returning your call."  Yes, Dr. Jacqualyn Posey would like to see you today or Tuesday regarding your surgery.  "May I ask why?"  Medicaid will not cover the implant he wants to use.  So, he may have to change your procedure.  He needs to re-evaluate you.  "Okay, I can't do it today, schedule me for Tuesday."  I transferred her to a scheduler.

## 2015-05-17 ENCOUNTER — Telehealth: Payer: Self-pay | Admitting: *Deleted

## 2015-05-17 ENCOUNTER — Encounter: Payer: Self-pay | Admitting: *Deleted

## 2015-05-17 NOTE — Telephone Encounter (Signed)
I'm calling to get the name of your primary care doctor.  Dr. Jacqualyn Posey wants to send him a request for medical clearance.  "My doctor's name is Alison Walker."    Medical clearance letter was faxed to Dr. Tobie Lords for surgery scheduled for 05/22/2015.

## 2015-05-17 NOTE — Telephone Encounter (Signed)
ENTERED IN ERROR

## 2015-05-20 ENCOUNTER — Telehealth: Payer: Self-pay | Admitting: *Deleted

## 2015-05-20 NOTE — Telephone Encounter (Signed)
Pt left name and phone number only.  Pt states she didn't want [place] to think she was trying to get drugs for not reason, she sees that our office can see what other doctors prescribe.  I told pt Dr. Jacqualyn Posey Did see the Oxycodone rx of 04/2014 and did not know the pt had a current rx for Oxycodone because it was not in our system.  Pt states she now understands why the pharmacy would not fill the Hydrocodone.

## 2015-05-21 ENCOUNTER — Ambulatory Visit: Payer: Medicaid Other | Admitting: Podiatry

## 2015-05-22 ENCOUNTER — Encounter: Payer: Self-pay | Admitting: Podiatry

## 2015-05-22 DIAGNOSIS — M2020 Hallux rigidus, unspecified foot: Secondary | ICD-10-CM | POA: Diagnosis not present

## 2015-05-22 DIAGNOSIS — L921 Necrobiosis lipoidica, not elsewhere classified: Secondary | ICD-10-CM | POA: Diagnosis not present

## 2015-05-23 ENCOUNTER — Encounter: Payer: Self-pay | Admitting: Podiatry

## 2015-05-23 NOTE — Progress Notes (Signed)
Patient ID: Alison Walker, female   DOB: 07-Nov-1960, 54 y.o.   MRN: [id number]  I received a message stating that the implant that I was going to use for her surgery have been denied by her insurance. Because of this I needed to see the patient back in the office to discuss surgical options as implants were denied. The patient came to the office to pick up a pain medicine prescription. While she can pick up the prescription I did bring her back and talk to her about her surgical options. Given the hallux rigidus I discussed with her possible Keller bunionectomy versus MTPJ fusion. Given that she does smoke quite a bit I would hold off on MPJ fusion if possible due to risk of nonhealing. I discussed with her Jake Michaelis bunionectomy including all alternatives, risks, complications for which she understands and she would like to proceed with the surgery. She also had to have the foreign body removed as well as have her toenail taken off temporarily. She does not want have a permanent nail avulsion. I discussed the nail will likely grow back the same however show that to give it a chance.   I discussed with her Mateo Flow, foreign body removal, temporary  Total nail avulsion for which she wishes to proceed with surgery. Today,  she did sign the consent forms.  The incision placement as well as the postoperative course was discussed with the patient. I discussed risks of the surgery which include, but not limited to, infection, bleeding, pain, swelling, need for further surgery, delayed or nonhealing, painful or ugly scar, numbness or sensation changes, over/under correction, recurrence, transfer lesions, further deformity, flail or floppy toe, hardware failure, DVT/PE, loss of toe/foot. Patient understands these risks and wishes to proceed with surgery. The surgical consent was reviewed with the patient all 3 pages were signed. No promises or guarantees were given to the outcome of the procedure. All questions  were answered to the best of my ability. Before the surgery the patient was encouraged to call the office if there is any further questions. The surgery will be performed at the [facility] on an outpatient basis.  This encounter took place on 05/16/15  Celesta Gentile, DPM

## 2015-05-27 ENCOUNTER — Other Ambulatory Visit: Payer: Self-pay | Admitting: Podiatry

## 2015-05-27 ENCOUNTER — Telehealth: Payer: Self-pay | Admitting: *Deleted

## 2015-05-27 MED ORDER — HYDROCODONE-ACETAMINOPHEN 5-325 MG PO TABS: ORAL_TABLET | ORAL | Status: DC

## 2015-05-27 NOTE — Telephone Encounter (Signed)
Pt called left name and phone number.  I called pt and she said the pharmacy has fixed her pain medication.  I reminded pt of her 05/30/2015 appt at 900am.

## 2015-05-29 ENCOUNTER — Telehealth: Payer: Self-pay | Admitting: *Deleted

## 2015-05-29 NOTE — Telephone Encounter (Signed)
Pt states she calling about her surgery foot.  Pt states the toe is throbbing.  I told pt to remove the boot, open ended sock and remove the ace wrap only, then elevate the foot and may ice then, after 15 minutes, rewrap ace looser, put on the open ended sock and the boot, sleep in the boot, and don't be up on the foot more than 64min/hour the 1st week post op and always be in the boot to walk.  Pt agreed and thanked me for the call.

## 2015-05-30 ENCOUNTER — Ambulatory Visit (INDEPENDENT_AMBULATORY_CARE_PROVIDER_SITE_OTHER): Payer: Medicaid Other

## 2015-05-30 ENCOUNTER — Encounter: Payer: Self-pay | Admitting: Podiatry

## 2015-05-30 ENCOUNTER — Ambulatory Visit (INDEPENDENT_AMBULATORY_CARE_PROVIDER_SITE_OTHER): Payer: Medicaid Other | Admitting: Podiatry

## 2015-05-30 VITALS — BP 128/78 | HR 71 | Resp 18

## 2015-05-30 DIAGNOSIS — M795 Residual foreign body in soft tissue: Secondary | ICD-10-CM

## 2015-05-30 DIAGNOSIS — M205X1 Other deformities of toe(s) (acquired), right foot: Secondary | ICD-10-CM

## 2015-05-30 DIAGNOSIS — Z9889 Other specified postprocedural states: Secondary | ICD-10-CM

## 2015-05-30 MED ORDER — CEPHALEXIN 500 MG PO CAPS: 500.0000 mg | ORAL_CAPSULE | Freq: Three times a day (TID) | ORAL | Status: DC

## 2015-05-30 MED ORDER — OXYCODONE-ACETAMINOPHEN 5-325 MG PO TABS: 1.0000 | ORAL_TABLET | ORAL | Status: DC | PRN

## 2015-05-30 NOTE — Progress Notes (Signed)
Patient ID: ASIANNA LIAKOS, female   DOB: Oct 03, 1960, 54 y.o.   MRN: [id number]  Subjective: Alison Walker is a 54 y.o. is seen today in office s/p Right keller bunionectomy, foreign body removal, toenail removal preformed on 06/01/15.  She states that she has continued have some pain to the right foot although does appear to be somewhat improved compared total was immediately after surgery. She isn't wearing the boot majority time but she has taken off. She's continue with Keflex. She is asking for refill of her pain medication. She did not get the Norco filled. She is asking for Percocet as it seems to work better. Denies any systemic complaints such as fevers, chills, nausea, vomiting. No calf pain, chest pain, shortness of breath.   Objective: General: No acute distress, AAOx3  DP/PT pulses palpable 2/4, CRT < 3 sec to all digits.  Protective sensation intact. Motor function intact.  Right foot: Incision is well coapted without any evidence of dehiscence and sutures are intact.  Versus small flaccid appearing bulla adjacent to the incision but not connected the dorsal incision. There is a faint amount of erythema around the incision however there is no increase in warmth and no ascending cellulitis. There is no drainage or purulence expressed. There is no malodor. There is no fluctuance or crepitus. There is mild edema other skin lines present. There is mild to palpation on the surgical site is mild pain with first MTPJ range of motion. There is no other areas of tenderness to bilateral lower extremities. Hammertoe contractures are present bilaterally. No other open lesions or pre-ulcerative lesions.  No pain with calf compression, swelling, warmth, erythema.   Assessment and Plan:  Status post left foot surgery, doing well however has continued pain.   -X-rays were obtained and reviewed with the patient.  -Treatment options discussed including all alternatives, risks, and complications -  antibiotic 1 is placed over the incisions followed by dry sterile dressing. -Ice/elevation -Pain medication as needed. Prescribed  Percocet. -Due to the erythema continue antibody since I see her next appointment. This is refilled today. - vital long discussion with the patient be scarred to the intraoperative findings. 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 Prior to surgery implant was denied by insurance and given her history of smoking a number to proceed with a fusion therefore we proceeded with a Keller bunionectomy. If she continues have pain of the first MTPJ with range of motion will likely need to go back for an implant. She understands the potential for this. -Monitor for any clinical signs or symptoms of infection and DVT/PE and directed to call the office immediately should any occur or go to the ER. -Follow-up in 1 week for suture removal or sooner if any problems arise. In the meantime, encouraged to call the office with any questions, concerns, change in symptoms.   Celesta Gentile, DPM

## 2015-06-04 ENCOUNTER — Telehealth: Payer: Self-pay | Admitting: *Deleted

## 2015-06-04 ENCOUNTER — Encounter: Payer: Self-pay | Admitting: Podiatry

## 2015-06-04 NOTE — Telephone Encounter (Addendum)
Pt states she will run out of her pain medication before the end of the Thanksgiving vacation and would like to pick up a rx to have on hand.  I told pt she would have to pick it up at the [place] office.  Pt states understanding.  Pt called states there is no one at the [place] office.  I asked the pt if she had picked up the oxycodone yesterday in the [place] office and she said no.  I refilled the oxycodone and our office manager is talking to the pt as to how she will pick it up.  Alison Walker [facility] office manager informed pt she would have to pick up the Oxycodone rx in [place], since she did not pick it up in the [place] office on 06/04/2015.  11/282016 The Oxycodone rx was still taped to the back door of [facility], and destroyed by Alison Walker and shredded at 0815am.  Pt called 0900am requesting to pick up the Oxycodone rx in [place].  Pt was informed she would need to pick up the rx in [place], there is no staff in the [place] office today.  Pt states she will send her son Alison Walker.  Refilled Oxycodone as previously written.

## 2015-06-04 NOTE — Telephone Encounter (Signed)
Please refill.

## 2015-06-05 MED ORDER — OXYCODONE-ACETAMINOPHEN 5-325 MG PO TABS: 1.0000 | ORAL_TABLET | ORAL | Status: DC | PRN

## 2015-06-10 MED ORDER — OXYCODONE-ACETAMINOPHEN 5-325 MG PO TABS: 1.0000 | ORAL_TABLET | ORAL | Status: DC | PRN

## 2015-06-11 ENCOUNTER — Encounter: Payer: Medicaid Other | Admitting: Podiatry

## 2015-06-11 ENCOUNTER — Telehealth: Payer: Self-pay | Admitting: *Deleted

## 2015-06-11 NOTE — Telephone Encounter (Signed)
She needs to go to pain clinic.

## 2015-06-11 NOTE — Telephone Encounter (Addendum)
Erasmo Downer states pt had drug screen that showed +Benzodiazepine, after stating she had stopped this medication.  Dr. Ovid Curd [place]'s assistant called to see if we had prescribed and I told Erasmo Downer no, and asked that a report be faxed to our office for Dr. Jacqualyn Posey to review as well.  I called Erasmo Downer - Dr. Nathen May assistant and asked if Dr. Tobie Lords was managing her pain medication or had a pain contract, she stated Dr. Tobie Lords had referred pt to [place].  06/25/2015 - Called to speak with pt and Debbie answered she said pt was humiliated by Dr. Tobie Lords and how he managed her pain medication, and she wasn't going to be humiliated by a doctor again, so she took her stitches out.  Debbie states pt has had 7 stomach surgeries and a stroke, and is not abusing the pain medications.  I told Jackelyn Poling that pt's medications curtail and referral to pain management were not without reason or concern for the pt's health.  I told Jackelyn Poling if she was responsible for pt's appts lets get her in to make certain she doesn't get into any other health status problems.

## 2015-06-12 NOTE — Telephone Encounter (Signed)
Great. Thank you.

## 2015-06-13 NOTE — Progress Notes (Signed)
Patient ID: Alison Walker, female   DOB: 04/11/1961, 54 y.o.   MRN: [id number] Dr Jacqualyn Posey performed a right foot big toe joint removal, foreign body removal and right hallux nail avulsion on 05/22/2015 at [facility]

## 2015-06-15 ENCOUNTER — Telehealth: Payer: Self-pay | Admitting: *Deleted

## 2015-06-17 ENCOUNTER — Telehealth: Payer: Self-pay | Admitting: *Deleted

## 2015-06-18 ENCOUNTER — Telehealth: Payer: Self-pay | Admitting: *Deleted

## 2015-06-18 NOTE — Telephone Encounter (Addendum)
Pt called state she would like to speak to Dr. Jacqualyn Posey about her feet.  I informed pt, she would benefit from an appt, and transferred to schedulers.

## 2015-06-19 NOTE — Telephone Encounter (Signed)
"  I'd like to speak to the surgeon on call."

## 2015-06-19 NOTE — Telephone Encounter (Signed)
"  I'd like someone to contact me over my foot surgery as soon as you can."

## 2015-06-27 ENCOUNTER — Encounter: Payer: Medicaid Other | Admitting: Podiatry

## 2015-07-08 ENCOUNTER — Telehealth: Payer: Self-pay | Admitting: *Deleted

## 2015-07-08 NOTE — Telephone Encounter (Signed)
Pt called states her surgery foot became red, swollen and painful over Christmas.  I told pt I would have the schedulers call her to schedule in [place] with Dr. Jacqualyn Posey, and inform him and call with further instructions.

## 2015-07-09 ENCOUNTER — Ambulatory Visit: Payer: Medicaid Other | Admitting: Podiatry

## 2015-07-11 ENCOUNTER — Ambulatory Visit (INDEPENDENT_AMBULATORY_CARE_PROVIDER_SITE_OTHER): Payer: Medicaid Other | Admitting: Podiatry

## 2015-07-12 NOTE — Progress Notes (Signed)
Patient ID: Alison Walker, female   DOB: 26-Sep-1960, 54 y.o.   MRN: [id number]  Ms. Hanlan was a no show for today's appointment. After her surgery I only saw her for the first postop visit. She did take her stitches out herself and she no longer follow-up at the office with me. She had an appointment on Tuesday however she came to the office over an hour late for her appointment and I wasn't returns with clinic on the way to another office to start afternoon clinic. Because of that she was rescheduled for Thursday and the patient/family member made this appointment themselves and were physically present at the time this appointment was made. She did come with a family member who insisted that she would be cured her appointment today and that she would personally bring her. Today again she was a no-show for the appointment and she did not call from what I understand.

## 2015-07-24 ENCOUNTER — Telehealth: Payer: Self-pay

## 2015-07-24 NOTE — Telephone Encounter (Signed)
Spoke with pt regarding post op foot pain. She states that she needed to talk to Dr Jacqualyn Posey. I advised her to come in to [place] office to see Dr Jacqualyn Posey on 07/25/15 at 10:30am, she stated that she could come in and she would be here for that scheduled time to see him. I repeated 3 times to the patient that she needs to come to our [place] office to be seen, that there will be nobody available to see her in [place]. Pt understood with verbal feedback.

## 2015-07-25 ENCOUNTER — Ambulatory Visit: Payer: Medicaid Other | Admitting: Podiatry

## 2015-07-25 ENCOUNTER — Telehealth: Payer: Self-pay | Admitting: Podiatry

## 2015-07-25 NOTE — Telephone Encounter (Signed)
S/w with Mrs. Alison Walker and Alison Walker- discussed her missed appointments and her latest one which was this morning .  This morning was scheduled after she called on the evening of 07/24/2015 stating that she was in extreme pain and needed to be seen. She was scheduled to be seen on 07/25/2015 which was canceled at 8:45 am.  The fact that Alison Walker has not been keeping her Post Op appointments was mentioned and she was asked how can she make arrangements to be seen in order for Alison Walker to treat her.  She stated she went to [facility] and they took x rays that she wanted Alison Walker to look at. I told her that we do not have access to those x rays and that Alison Walker needed to see her in the office to provide her care.  I stated that she was making it difficult for [place] to care for her and how could she make transportation arrangements via taxi, or [facility] to be seen.  I stated this because her common reason for no-show  is that she cannot get a ride on  appointment days. I told her that we wanted to work with her but she was making it very difficult. At that point she spoke with someone in the background that she doesn't want to work with [place] and she hung up the phone.

## 2015-08-14 ENCOUNTER — Encounter: Payer: Self-pay | Admitting: Emergency Medicine

## 2015-08-14 ENCOUNTER — Emergency Department
Admission: EM | Admit: 2015-08-14 | Discharge: 2015-08-14 | Disposition: A | Discharge status: Home / Self Care | Payer: Medicaid Other | Attending: Emergency Medicine | Admitting: Emergency Medicine

## 2015-08-14 ENCOUNTER — Ambulatory Visit: Payer: Medicaid Other | Admitting: Family Medicine

## 2015-08-14 ENCOUNTER — Emergency Department: Payer: Medicaid Other

## 2015-08-14 DIAGNOSIS — R1030 Lower abdominal pain, unspecified: Secondary | ICD-10-CM

## 2015-08-14 DIAGNOSIS — Z9049 Acquired absence of other specified parts of digestive tract: Secondary | ICD-10-CM | POA: Insufficient documentation

## 2015-08-14 DIAGNOSIS — Z79899 Other long term (current) drug therapy: Secondary | ICD-10-CM | POA: Insufficient documentation

## 2015-08-14 DIAGNOSIS — R197 Diarrhea, unspecified: Secondary | ICD-10-CM | POA: Insufficient documentation

## 2015-08-14 DIAGNOSIS — R112 Nausea with vomiting, unspecified: Secondary | ICD-10-CM | POA: Diagnosis not present

## 2015-08-14 DIAGNOSIS — Z792 Long term (current) use of antibiotics: Secondary | ICD-10-CM | POA: Diagnosis not present

## 2015-08-14 DIAGNOSIS — F17200 Nicotine dependence, unspecified, uncomplicated: Secondary | ICD-10-CM | POA: Diagnosis not present

## 2015-08-14 DIAGNOSIS — Z7982 Long term (current) use of aspirin: Secondary | ICD-10-CM | POA: Insufficient documentation

## 2015-08-14 DIAGNOSIS — I10 Essential (primary) hypertension: Secondary | ICD-10-CM | POA: Diagnosis not present

## 2015-08-14 LAB — URINALYSIS COMPLETE WITH MICROSCOPIC (ARMC ONLY)
Bacteria, UA: NONE SEEN
Bilirubin Urine: NEGATIVE
Glucose, UA: NEGATIVE mg/dL
Hgb urine dipstick: NEGATIVE
Ketones, ur: NEGATIVE mg/dL
Leukocytes, UA: NEGATIVE
Nitrite: NEGATIVE
Protein, ur: NEGATIVE mg/dL
RBC / HPF: NONE SEEN RBC/hpf (ref 0–5)
Specific Gravity, Urine: 1.005 (ref 1.005–1.030)
pH: 6 (ref 5.0–8.0)

## 2015-08-14 LAB — CBC WITH DIFFERENTIAL/PLATELET
Basophils Absolute: 0.1 10*3/uL (ref 0–0.1)
Basophils Relative: 1 %
Eosinophils Absolute: 0.2 10*3/uL (ref 0–0.7)
Eosinophils Relative: 3 %
HCT: 36.7 % (ref 35.0–47.0)
Hemoglobin: 12.2 g/dL (ref 12.0–16.0)
Lymphocytes Relative: 27 %
Lymphs Abs: 2.3 10*3/uL (ref 1.0–3.6)
MCH: 30.9 pg (ref 26.0–34.0)
MCHC: 33.2 g/dL (ref 32.0–36.0)
MCV: 93.1 fL (ref 80.0–100.0)
Monocytes Absolute: 0.6 10*3/uL (ref 0.2–0.9)
Monocytes Relative: 7 %
Neutro Abs: 5.3 10*3/uL (ref 1.4–6.5)
Neutrophils Relative %: 62 %
Platelets: 336 10*3/uL (ref 150–440)
RBC: 3.94 MIL/uL (ref 3.80–5.20)
RDW: 15 % — ABNORMAL HIGH (ref 11.5–14.5)
WBC: 8.5 10*3/uL (ref 3.6–11.0)

## 2015-08-14 LAB — COMPREHENSIVE METABOLIC PANEL
ALT: 10 U/L — ABNORMAL LOW (ref 14–54)
AST: 14 U/L — ABNORMAL LOW (ref 15–41)
Albumin: 3.5 g/dL (ref 3.5–5.0)
Alkaline Phosphatase: 71 U/L (ref 38–126)
Anion gap: 7 (ref 5–15)
BUN: 13 mg/dL (ref 6–20)
CO2: 22 mmol/L (ref 22–32)
Calcium: 8.7 mg/dL — ABNORMAL LOW (ref 8.9–10.3)
Chloride: 109 mmol/L (ref 101–111)
Creatinine, Ser: 0.49 mg/dL (ref 0.44–1.00)
GFR calc Af Amer: >60 mL/min (ref >60)
GFR calc non Af Amer: >60 mL/min (ref >60)
Glucose, Bld: 113 mg/dL — ABNORMAL HIGH (ref 65–99)
Potassium: 3.7 mmol/L (ref 3.5–5.1)
Sodium: 138 mmol/L (ref 135–145)
Total Bilirubin: <0.1 mg/dL — ABNORMAL LOW (ref 0.3–1.2)
Total Protein: 6.8 g/dL (ref 6.5–8.1)

## 2015-08-14 LAB — LIPASE, BLOOD: Lipase: 16 U/L (ref 11–51)

## 2015-08-14 MED ORDER — LOPERAMIDE HCL 2 MG PO TABS: 2.0000 mg | ORAL_TABLET | Freq: Four times a day (QID) | ORAL | Status: DC | PRN

## 2015-08-14 MED ORDER — HYDROCODONE-ACETAMINOPHEN 5-325 MG PO TABS: ORAL_TABLET | ORAL | Status: DC

## 2015-08-14 MED ORDER — ONDANSETRON HCL 4 MG/2ML IJ SOLN
INTRAMUSCULAR | Status: AC
  Filled 2015-08-14: qty 2

## 2015-08-14 MED ORDER — ONDANSETRON HCL 4 MG/2ML IJ SOLN
4.0000 mg | Freq: Once | INTRAMUSCULAR | Status: AC
  Administered 2015-08-14: 4 mg via INTRAVENOUS
  Filled 2015-08-14: qty 2

## 2015-08-14 MED ORDER — ONDANSETRON HCL 4 MG/2ML IJ SOLN
4.0000 mg | Freq: Once | INTRAMUSCULAR | Status: AC
  Administered 2015-08-14: 4 mg via INTRAVENOUS

## 2015-08-14 MED ORDER — IOHEXOL 300 MG/ML  SOLN
100.0000 mL | Freq: Once | INTRAMUSCULAR | Status: AC | PRN
  Administered 2015-08-14: 100 mL via INTRAVENOUS
  Filled 2015-08-14: qty 100

## 2015-08-14 MED ORDER — HYDROMORPHONE HCL 1 MG/ML IJ SOLN
1.0000 mg | Freq: Once | INTRAMUSCULAR | Status: AC
  Administered 2015-08-14: 1 mg via INTRAVENOUS
  Filled 2015-08-14: qty 1

## 2015-08-14 MED ORDER — ONDANSETRON 4 MG PO TBDP: 4.0000 mg | ORAL_TABLET | Freq: Three times a day (TID) | ORAL | Status: DC | PRN

## 2015-08-14 MED ORDER — IOHEXOL 240 MG/ML SOLN
25.0000 mL | Freq: Once | INTRAMUSCULAR | Status: AC | PRN
  Administered 2015-08-14: 25 mL via ORAL

## 2015-08-14 NOTE — ED Notes (Signed)
NAD noted at this time. Pt taken to lobby via wheelchair to call her ride. Pt alert and oriented at this time.

## 2015-08-14 NOTE — ED Provider Notes (Signed)
[facility] Emergency Department Provider Note  ____________________________________________  Time seen: Approximately 4:07 PM  I have reviewed the triage vital signs and the nursing notes.   HISTORY  Chief Complaint Abdominal Pain and Diarrhea    HPI Miyani Batzel Pontrelli is a 55 y.o. 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 Patient describes a severe "dull" pain in the lower abdomen diffusely. This is been associated with a large amount of diarrhea and 2 episodes of vomiting. No fever, chills, urinary symptoms, patient is not sexually active and has no change in her vaginal discharge.Nothing makes the pain better or worse.   Past Medical History  Diagnosis Date  . Hypertension   . Diverticulitis   . S/P ablation operation for arrhythmia     for WPW  . Wolf-Parkinson-White syndrome     There are no active problems to display for this patient.   Past Surgical History  Procedure Laterality Date  . Abdominal surgery    . Colonoscopy    . Colostomy    . Colostomy revision    . Cholecystectomy      Current Outpatient Rx  Name  Route  Sig  Dispense  Refill  . ALPRAZolam (XANAX) 0.5 MG tablet   Oral   Take 0.5 mg by mouth at bedtime as needed for anxiety.         Marland Kitchen aspirin EC 81 MG tablet   Oral   Take 81 mg by mouth.         Marland Kitchen atenolol (TENORMIN) 50 MG tablet   Oral   Take 50 mg by mouth.         Marland Kitchen atorvastatin (LIPITOR) 40 MG tablet   Oral   Take 40 mg by mouth daily.         . cephALEXin (KEFLEX) 500 MG capsule   Oral   Take 1 capsule (500 mg total) by mouth 3 (three) times daily.   21 capsule   2   . cyclobenzaprine (FLEXERIL) 10 MG tablet   Oral   Take 10 mg by mouth.         . desipramine (NORPRAMIN) 50 MG tablet   Oral   Take 50 mg by mouth.         . dicyclomine (BENTYL) 20 MG  tablet   Oral   Take 20 mg by mouth every 6 (six) hours.         Marland Kitchen escitalopram (LEXAPRO) 20 MG tablet   Oral   Take 20 mg by mouth daily.         Marland Kitchen HYDROcodone-acetaminophen (NORCO/VICODIN) 5-325 MG tablet      Take one tablets by mouth every six to eight hours as needed for pain.   10 tablet   0   . lisinopril (PRINIVIL,ZESTRIL) 10 MG tablet   Oral   Take 10 mg by mouth.         . loperamide (LOPERAMIDE A-D) 2 MG tablet   Oral   Take 1 tablet (2 mg total) by mouth 4 (four) times daily as needed for diarrhea or loose stools.   12 tablet   0   . omeprazole (PRILOSEC) 40 MG capsule   Oral   Take 40 mg by mouth daily.         . ondansetron (ZOFRAN ODT) 4 MG disintegrating tablet   Oral   Take 1 tablet (4 mg total) by  mouth every 8 (eight) hours as needed for nausea or vomiting.   20 tablet   0   . oxyCODONE-acetaminophen (ROXICET) 5-325 MG tablet   Oral   Take 1 tablet by mouth every 4 (four) hours as needed for severe pain.   30 tablet   0   . predniSONE (DELTASONE) 20 MG tablet      3 po daily for 2 days, then 2 po daily for 2 days, then 1 po daily for 2 days         . Sennosides (SENNA) 8.6 MG CAPS   Oral   Take by mouth.         . terbinafine (LAMISIL) 250 MG tablet   Oral   Take 250 mg by mouth daily.         . traMADol (ULTRAM) 50 MG tablet   Oral   Take 1 tablet (50 mg total) by mouth every 6 (six) hours as needed.   12 tablet   0     Allergies Ciprofloxacin and Morphine  Family History  Problem Relation Age of Onset  . Cancer Mother   . Cancer Other     Social History Social History  Substance Use Topics  . Smoking status: Current Every Day Smoker -- 0.50 packs/day  . Smokeless tobacco: Never Used  . Alcohol Use: No    Review of Systems Constitutional: No fever/chills. No lightheadedness or syncope. Eyes: No visual changes. ENT: No sore throat. Cardiovascular: Denies chest pain, palpitations. Respiratory: Denies  shortness of breath.  No cough. Gastrointestinal: Positive diffuse lower abdominal pain.  Positive nausea, positive vomiting.  Positive diarrhea.  No constipation. Genitourinary: Negative for dysuria. No change in vaginal discharge. Musculoskeletal: Negative for back pain. Skin: Negative for rash. Neurological: Negative for headaches, focal weakness or numbness.  10-point ROS otherwise negative.  ____________________________________________   PHYSICAL EXAM:  VITAL SIGNS: ED Triage Vitals  Enc Vitals Group     BP 08/14/15 1330 115/61 mmHg     Pulse Rate 08/14/15 1330 84     Resp 08/14/15 1330 18     Temp 08/14/15 1330 98.2 F (36.8 C)     Temp Source 08/14/15 1330 Oral     SpO2 08/14/15 1330 98 %     Weight 08/14/15 1330 142 lb (64.411 kg)     Height 08/14/15 1330 5\' 2"  (1.575 m)     Head Cir --      Peak Flow --      Pain Score 08/14/15 1330 8     Pain Loc --      Pain Edu? --      Excl. in [place]? --     Constitutional: Patient is alert and oriented and answering questions appropriately. She is in mild discomfort which appears worse when she moves.  Eyes: Conjunctivae are normal.  EOMI. no scleral icterus. Head: Atraumatic. Nose: No congestion/rhinnorhea. Mouth/Throat: Mucous membranes are moist.  Neck: No stridor.  Supple.   Cardiovascular: Normal rate, regular rhythm. No murmurs, rubs or gallops.  Respiratory: Normal respiratory effort.  No retractions. Lungs CTAB.  No wheezes, rales or ronchi. Gastrointestinal: Abdomen is soft and mildly distended. Patient has diffuse tenderness to palpation in the lower abdomen without focality. No rebound, guarding or peritoneal signs. Musculoskeletal: No LE edema.  Neurologic:  Normal speech and language. No gross focal neurologic deficits are appreciated.  Skin:  Skin is warm, dry and intact. No rash noted. Psychiatric: Mood and affect are normal. Speech and behavior  are normal.  Normal  judgement.  ____________________________________________   LABS (all labs ordered are listed, but only abnormal results are displayed)  Labs Reviewed  CBC WITH DIFFERENTIAL/PLATELET - Abnormal; Notable for the following:    RDW 15.0 (*)    All other components within normal limits  COMPREHENSIVE METABOLIC PANEL - Abnormal; Notable for the following:    Glucose, Bld 113 (*)    Calcium 8.7 (*)    AST 14 (*)    ALT 10 (*)    Total Bilirubin <0.1 (*)    All other components within normal limits  LIPASE, BLOOD  URINALYSIS COMPLETEWITH MICROSCOPIC ([facility] ONLY)   ____________________________________________  EKG  Not indicated ____________________________________________  RADIOLOGY  Ct Abdomen Pelvis W Contrast  08/14/2015  CLINICAL DATA:  Abdominal pain EXAM: CT ABDOMEN AND PELVIS WITH CONTRAST TECHNIQUE: Multidetector CT imaging of the abdomen and pelvis was performed using the standard protocol following bolus administration of intravenous contrast. CONTRAST:  167mL OMNIPAQUE IOHEXOL 300 MG/ML  SOLN COMPARISON:  03/25/2015 FINDINGS: Lower chest: No pleural or pericardial effusion identified. There is a mild mosaic attenuation pattern identified within both lower lobes. Hepatobiliary: Previous cholecystectomy. There is moderate intrahepatic bile duct dilatation. The common bile duct measures 8 mm. This is similar to the previous exam. No obstructing stone or mass identified within the common bile duct. Pancreas: Negative Spleen: Negative Adrenals/Urinary Tract: The adrenal glands are normal. Unremarkable appearance of both kidneys.The urinary bladder appears normal. Stomach/Bowel: The stomach appears normal. The small bowel loops have a normal course and caliber. The appendix is visualized and appears normal. Intera contrast material was noted in the distal small bowel loops and proximal colon. There is no pathologic dilatation of the large bowel loops. Vascular/Lymphatic: Calcified  atherosclerotic disease involves the abdominal aorta. No aneurysm. No enlarged retroperitoneal or mesenteric adenopathy. No enlarged pelvic or inguinal lymph nodes. Reproductive: Previous hysterectomy.  No adnexal mass. Other: No free fluid or fluid collections identified within the abdomen or pelvis. Musculoskeletal: Degenerative disc disease noted at the L4-5 and L5-S1 level. IMPRESSION: 1. There is no acute finding identified within the abdomen or pelvis. 2. Chronic intrahepatic and extrahepatic biliary dilatation status post cholecystectomy. Unchanged from previous exam. 3. Aortic atherosclerosis 4. Lumbar degenerative disc disease. 5. Mosaic attenuation pattern noted within both lower lobes which may be a manifestation of small airways disease. Electronically Signed   By: Kerby Moors M.D.   On: 08/14/2015 17:09    ____________________________________________   PROCEDURES  Procedure(s) performed: None  Critical Care performed: No ____________________________________________   INITIAL IMPRESSION / ASSESSMENT AND PLAN / ED COURSE  Pertinent labs & imaging results that were available during my care of the patient were reviewed by me and considered in my medical decision making (see chart for details).  55 y.o. female with a history of multiple remote abdominal surgeries presenting with nausea vomiting and diarrhea. The most likely etiology of her symptoms is a viral or foodborne GI illness, but given her significant surgical history, I will get a CT scan to evaluate for diverticulitis, and atypical obstruction. We will initiate aggressive symptomatic management and reevaluate the patient after the remainder of her testing is complete.  ----------------------------------------- 5:35 PM on 08/14/2015 -----------------------------------------  The patient's symptoms have improved, her pain has improved and the nausea has resolved. The CT scan does not show any acute abdominal pathology at  this time. We'll plan to discharge the patient home after she tolerates by mouth. She understands return precautions as well  as discharge instructions. ____________________________________________  FINAL CLINICAL IMPRESSION(S) / ED DIAGNOSES  Final diagnoses:  Nausea vomiting and diarrhea  Lower abdominal pain      NEW MEDICATIONS STARTED DURING THIS VISIT:  New Prescriptions   LOPERAMIDE (LOPERAMIDE A-D) 2 MG TABLET    Take 1 tablet (2 mg total) by mouth 4 (four) times daily as needed for diarrhea or loose stools.   ONDANSETRON (ZOFRAN ODT) 4 MG DISINTEGRATING TABLET    Take 1 tablet (4 mg total) by mouth every 8 (eight) hours as needed for nausea or vomiting.     Eula Listen, MD 08/14/15 1735

## 2015-08-14 NOTE — Discharge Instructions (Signed)
Please take a clear liquid diet for the next 12-24 hours. Then advance to a bland BRAT diet as tolerated.  Return to the emergency department if you develop severe pain, fever, inability to keep down fluids, lightheadedness or fainting, or any other symptoms concerning to you.

## 2015-08-14 NOTE — ED Notes (Signed)
Patient complains of abdominal pain starting yesterday, increasing into this morning. Patient states she had one episode of "explosive diarrhea" this morning. Patient states she saw a small amount of bright red blood in toilet. Patient also states two episodes of vomiting this morning. Patient states dizziness and generalized weakness. Patient alert and oriented x4.

## 2015-09-03 ENCOUNTER — Ambulatory Visit: Payer: Medicaid Other | Admitting: Podiatry

## 2015-09-04 ENCOUNTER — Encounter: Payer: Self-pay | Admitting: Podiatry

## 2015-09-04 NOTE — Progress Notes (Signed)
Patient ID: Alison Walker, female   DOB: [birth year], 55 y.o.   MRN: [id number]  Patient had an appointment on 09/03/15 in [place]. She called the morning of the appointment and spoke to the front desk staff. She said that her foot was swollen and red and could not come to the office. Recommended her go to the ER. We attempted to reschedule her appointment. I have not seen her since November after POV #1.

## 2015-10-11 NOTE — Progress Notes (Signed)
This encounter was created in error - please disregard.

## 2016-02-25 ENCOUNTER — Emergency Department
Admission: EM | Admit: 2016-02-25 | Discharge: 2016-02-25 | Disposition: A | Discharge status: Home / Self Care | Payer: Medicaid Other | Attending: Emergency Medicine | Admitting: Emergency Medicine

## 2016-02-25 ENCOUNTER — Emergency Department: Payer: Medicaid Other

## 2016-02-25 DIAGNOSIS — Z7982 Long term (current) use of aspirin: Secondary | ICD-10-CM | POA: Insufficient documentation

## 2016-02-25 DIAGNOSIS — I10 Essential (primary) hypertension: Secondary | ICD-10-CM | POA: Diagnosis not present

## 2016-02-25 DIAGNOSIS — Z79899 Other long term (current) drug therapy: Secondary | ICD-10-CM | POA: Diagnosis not present

## 2016-02-25 DIAGNOSIS — S299XXA Unspecified injury of thorax, initial encounter: Secondary | ICD-10-CM | POA: Diagnosis present

## 2016-02-25 DIAGNOSIS — S2231XA Fracture of one rib, right side, initial encounter for closed fracture: Secondary | ICD-10-CM | POA: Diagnosis not present

## 2016-02-25 DIAGNOSIS — Y939 Activity, unspecified: Secondary | ICD-10-CM | POA: Insufficient documentation

## 2016-02-25 DIAGNOSIS — Y999 Unspecified external cause status: Secondary | ICD-10-CM | POA: Diagnosis not present

## 2016-02-25 DIAGNOSIS — X58XXXA Exposure to other specified factors, initial encounter: Secondary | ICD-10-CM | POA: Insufficient documentation

## 2016-02-25 DIAGNOSIS — Y929 Unspecified place or not applicable: Secondary | ICD-10-CM | POA: Diagnosis not present

## 2016-02-25 DIAGNOSIS — S2242XA Multiple fractures of ribs, left side, initial encounter for closed fracture: Secondary | ICD-10-CM | POA: Diagnosis not present

## 2016-02-25 DIAGNOSIS — R52 Pain, unspecified: Secondary | ICD-10-CM

## 2016-02-25 DIAGNOSIS — S2232XA Fracture of one rib, left side, initial encounter for closed fracture: Secondary | ICD-10-CM

## 2016-02-25 MED ORDER — OXYCODONE-ACETAMINOPHEN 5-325 MG PO TABS: 1.0000 | ORAL_TABLET | ORAL | 0 refills | Status: DC | PRN

## 2016-02-25 MED ORDER — METHOCARBAMOL 750 MG PO TABS: 750.0000 mg | ORAL_TABLET | Freq: Four times a day (QID) | ORAL | 0 refills | Status: DC

## 2016-02-25 MED ORDER — OXYCODONE-ACETAMINOPHEN 5-325 MG PO TABS
2.0000 | ORAL_TABLET | Freq: Once | ORAL | Status: AC
  Administered 2016-02-25: 2 via ORAL
  Filled 2016-02-25: qty 2
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
## 2017-03-19 DIAGNOSIS — M2021 Hallux rigidus, right foot: Secondary | ICD-10-CM | POA: Insufficient documentation

## 2017-03-19 DIAGNOSIS — M2041 Other hammer toe(s) (acquired), right foot: Secondary | ICD-10-CM | POA: Insufficient documentation

## 2017-12-08 ENCOUNTER — Other Ambulatory Visit: Payer: Self-pay

## 2017-12-08 ENCOUNTER — Emergency Department: Payer: Self-pay

## 2017-12-08 ENCOUNTER — Encounter: Payer: Self-pay | Admitting: Emergency Medicine

## 2017-12-08 ENCOUNTER — Emergency Department
Admission: EM | Admit: 2017-12-08 | Discharge: 2017-12-08 | Disposition: A | Discharge status: Home / Self Care | Payer: Self-pay | Attending: Emergency Medicine | Admitting: Emergency Medicine

## 2017-12-08 DIAGNOSIS — R1030 Lower abdominal pain, unspecified: Secondary | ICD-10-CM

## 2017-12-08 DIAGNOSIS — Z79899 Other long term (current) drug therapy: Secondary | ICD-10-CM | POA: Insufficient documentation

## 2017-12-08 DIAGNOSIS — Z7982 Long term (current) use of aspirin: Secondary | ICD-10-CM | POA: Insufficient documentation

## 2017-12-08 DIAGNOSIS — R600 Localized edema: Secondary | ICD-10-CM | POA: Insufficient documentation

## 2017-12-08 DIAGNOSIS — R1032 Left lower quadrant pain: Secondary | ICD-10-CM | POA: Insufficient documentation

## 2017-12-08 DIAGNOSIS — I10 Essential (primary) hypertension: Secondary | ICD-10-CM | POA: Insufficient documentation

## 2017-12-08 DIAGNOSIS — F17210 Nicotine dependence, cigarettes, uncomplicated: Secondary | ICD-10-CM | POA: Insufficient documentation

## 2017-12-08 LAB — URINALYSIS, COMPLETE (UACMP) WITH MICROSCOPIC
Bacteria, UA: NONE SEEN
Bilirubin Urine: NEGATIVE
Glucose, UA: NEGATIVE mg/dL
Hgb urine dipstick: NEGATIVE
Ketones, ur: NEGATIVE mg/dL
Leukocytes, UA: NEGATIVE
Nitrite: NEGATIVE
Protein, ur: NEGATIVE mg/dL
Specific Gravity, Urine: 1.018 (ref 1.005–1.030)
pH: 5 (ref 5.0–8.0)

## 2017-12-08 LAB — COMPREHENSIVE METABOLIC PANEL
ALT: 14 U/L (ref 14–54)
AST: 20 U/L (ref 15–41)
Albumin: 3.5 g/dL (ref 3.5–5.0)
Alkaline Phosphatase: 71 U/L (ref 38–126)
Anion gap: 5 (ref 5–15)
BUN: 15 mg/dL (ref 6–20)
CO2: 27 mmol/L (ref 22–32)
Calcium: 8.3 mg/dL — ABNORMAL LOW (ref 8.9–10.3)
Chloride: 103 mmol/L (ref 101–111)
Creatinine, Ser: 0.7 mg/dL (ref 0.44–1.00)
GFR calc Af Amer: >60 mL/min (ref >60)
GFR calc non Af Amer: >60 mL/min (ref >60)
Glucose, Bld: 92 mg/dL (ref 65–99)
Potassium: 3.6 mmol/L (ref 3.5–5.1)
Sodium: 135 mmol/L (ref 135–145)
Total Bilirubin: 0.6 mg/dL (ref 0.3–1.2)
Total Protein: 6.3 g/dL — ABNORMAL LOW (ref 6.5–8.1)

## 2017-12-08 LAB — CBC
HCT: 29.5 % — ABNORMAL LOW (ref 35.0–47.0)
Hemoglobin: 9.9 g/dL — ABNORMAL LOW (ref 12.0–16.0)
MCH: 29.2 pg (ref 26.0–34.0)
MCHC: 33.5 g/dL (ref 32.0–36.0)
MCV: 87.1 fL (ref 80.0–100.0)
Platelets: 425 10*3/uL (ref 150–440)
RBC: 3.39 MIL/uL — ABNORMAL LOW (ref 3.80–5.20)
RDW: 15.7 % — ABNORMAL HIGH (ref 11.5–14.5)
WBC: 5 10*3/uL (ref 3.6–11.0)

## 2017-12-08 LAB — TROPONIN I: Troponin I: <0.03 ng/mL (ref <0.03)

## 2017-12-08 LAB — LIPASE, BLOOD: Lipase: 23 U/L (ref 11–51)

## 2017-12-08 LAB — BRAIN NATRIURETIC PEPTIDE: B Natriuretic Peptide: 142 pg/mL — ABNORMAL HIGH (ref 0.0–100.0)

## 2017-12-08 MED ORDER — TRAMADOL HCL 50 MG PO TABS: 50.0000 mg | ORAL_TABLET | Freq: Four times a day (QID) | ORAL | 0 refills | Status: AC | PRN

## 2017-12-08 MED ORDER — IOPAMIDOL (ISOVUE-300) INJECTION 61%
100.0000 mL | Freq: Once | INTRAVENOUS | Status: AC | PRN
  Administered 2017-12-08: 100 mL via INTRAVENOUS

## 2017-12-08 MED ORDER — ONDANSETRON HCL 4 MG/2ML IJ SOLN
4.0000 mg | Freq: Once | INTRAMUSCULAR | Status: AC
  Administered 2017-12-08: 4 mg via INTRAVENOUS
  Filled 2017-12-08: qty 2

## 2017-12-08 MED ORDER — HYDROMORPHONE HCL 1 MG/ML IJ SOLN
0.5000 mg | Freq: Once | INTRAMUSCULAR | Status: AC
Start: 2017-12-08 — End: 2017-12-08
  Administered 2017-12-08: 0.5 mg via INTRAVENOUS
  Filled 2017-12-08: qty 1

## 2017-12-08 NOTE — ED Notes (Signed)
Patient moved in error.  In ED Lobby.

## 2017-12-08 NOTE — ED Notes (Signed)
NAD noted at time of D/C. Pt denies questions or concerns. Pt taken to lobby via wheelchair at time of D/C. Pt D/C into care of her friend.

## 2017-12-08 NOTE — ED Notes (Signed)
Pt states slipped down the stairs yesterday and landed on her back. States since then sudden onset mid abdominal pain that is sharp/stabbing in nature and mid back pain. Pt also c/o BLE swelling that started yesterday, worse on the right. Pedal pulses palpated.

## 2017-12-08 NOTE — ED Notes (Signed)
Pt taken to CT at this time.

## 2017-12-08 NOTE — ED Provider Notes (Signed)
[facility] Emergency Department Provider Note   ____________________________________________    I have reviewed the triage vital signs and the nursing notes.   HISTORY  Chief Complaint Abdominal Pain; Back Pain; and Leg Swelling     HPI Alison Walker is a 57 y.o. female who presents with complaints primarily of lower abdominal pain.  Patient reports yesterday she was walking down a flight of 3 steps, her foot slipped and she fell onto her bottom, shortly thereafter she developed anterior lower bilateral abdominal pain which she describes as cramping in nature.  This became more severe over the last 12 hours or so.  She has a history of multiple abdominal surgeries as detailed below.  Denies nausea or vomiting.  Denies back pain.  Does report lower extreme edema, this is not new.  Full range of motion of all of her extremities, no chest pain.  Took Motrin and Tylenol without improvement   Past Medical History:  Diagnosis Date  . Diverticulitis   . Hypertension   . S/P ablation operation for arrhythmia    for WPW  . Wolf-Parkinson-White syndrome     There are no active problems to display for this patient.   Past Surgical History:  Procedure Laterality Date  . ABDOMINAL SURGERY    . CHOLECYSTECTOMY    . COLONOSCOPY    . COLOSTOMY    . COLOSTOMY REVISION      Prior to Admission medications   Medication Sig Start Date End Date Taking? Authorizing Provider  ALPRAZolam Duanne Moron) 0.5 MG tablet Take 0.5 mg by mouth at bedtime as needed for anxiety.    [provider]  aspirin EC 81 MG tablet Take 81 mg by mouth.    [provider]  atenolol (TENORMIN) 50 MG tablet Take 50 mg by mouth. 10/26/13   [provider]  atorvastatin (LIPITOR) 40 MG tablet Take 40 mg by mouth daily.    [provider]  desipramine (NORPRAMIN) 50 MG tablet Take 50 mg by mouth. 09/14/13   [provider]  dicyclomine (BENTYL) 20 MG  tablet Take 20 mg by mouth every 6 (six) hours.    [provider]  escitalopram (LEXAPRO) 20 MG tablet Take 20 mg by mouth daily.    [provider]  lisinopril (PRINIVIL,ZESTRIL) 10 MG tablet Take 10 mg by mouth. 10/26/13   [provider]  methocarbamol (ROBAXIN) 750 MG tablet Take 1 tablet (750 mg total) by mouth 4 (four) times daily. 02/25/16   Beers, Pierce Crane, PA-C  omeprazole (PRILOSEC) 40 MG capsule Take 40 mg by mouth daily.    [provider]  ondansetron (ZOFRAN ODT) 4 MG disintegrating tablet Take 1 tablet (4 mg total) by mouth every 8 (eight) hours as needed for nausea or vomiting. 08/14/15   Eula Listen, MD  oxyCODONE-acetaminophen (ROXICET) 5-325 MG tablet Take 1-2 tablets by mouth every 4 (four) hours as needed for severe pain. 02/25/16   Beers, Pierce Crane, PA-C  Sennosides (SENNA) 8.6 MG CAPS Take by mouth. 10/26/13   [provider]  terbinafine (LAMISIL) 250 MG tablet Take 250 mg by mouth daily.    [provider]  traMADol (ULTRAM) 50 MG tablet Take 1 tablet (50 mg total) by mouth every 6 (six) hours as needed. 12/08/17 12/08/18  Lavonia Drafts, MD     Allergies Ciprofloxacin and Morphine  Family History  Problem Relation Age of Onset  . Cancer Mother   . Cancer Other  Social History Social History   Tobacco Use  . Smoking status: Current Every Day Smoker    Packs/day: 0.50  . Smokeless tobacco: Never Used  Substance Use Topics  . Alcohol use: No    Alcohol/week: 0.0 oz  . Drug use: No    Review of Systems  Constitutional: No fever/chills Eyes: No visual changes.  ENT: No sore throat. Cardiovascular: Denies chest pain. Respiratory: Denies shortness of breath. Gastrointestinal: No abdominal pain.  No nausea, no vomiting.   Genitourinary: Negative for dysuria. Musculoskeletal: Negative for back pain. Skin: Negative for rash. Neurological: Negative for headaches or  weakness   ____________________________________________   PHYSICAL EXAM:  VITAL SIGNS: ED Triage Vitals  Enc Vitals Group     BP 12/08/17 0942 (!) 124/52     Pulse Rate 12/08/17 0942 79     Resp 12/08/17 0942 20     Temp 12/08/17 0942 99 F (37.2 C)     Temp Source 12/08/17 0942 Oral     SpO2 12/08/17 0942 98 %     Weight 12/08/17 0943 77.1 kg (170 lb)     Height 12/08/17 0943 1.575 m (5\' 2" )     Head Circumference --      Peak Flow --      Pain Score 12/08/17 0951 10     Pain Loc --      Pain Edu? --      Excl. in [place]? --     Constitutional: Alert and oriented. Eyes: Conjunctivae are normal.   Nose: No congestion/rhinnorhea. Mouth/Throat: Mucous membranes are moist.   Neck:  Painless ROM Cardiovascular: Normal rate, regular rhythm. Grossly normal heart sounds.  Good peripheral circulation. Respiratory: Normal respiratory effort.  No retractions.. Gastrointestinal: Mild tenderness palpation in the left lower quadrant and right lower quadrant, evidence of multiple prior surgeries, no distention  Musculoskeletal: 1-2+ edema bilaterally. warm and well perfused.  Back: No vertebral tenderness to palpation, normal range of motion without pain Neurologic:  Normal speech and language. No gross focal neurologic deficits are appreciated.  Skin:  Skin is warm, dry and intact. No rash noted. Psychiatric: Mood and affect are normal. Speech and behavior are normal.  ____________________________________________   LABS (all labs ordered are listed, but only abnormal results are displayed)  Labs Reviewed  COMPREHENSIVE METABOLIC PANEL - Abnormal; Notable for the following components:      Result Value   Calcium 8.3 (*)    Total Protein 6.3 (*)    All other components within normal limits  CBC - Abnormal; Notable for the following components:   RBC 3.39 (*)    Hemoglobin 9.9 (*)    HCT 29.5 (*)    RDW 15.7 (*)    All other components within normal limits  URINALYSIS,  COMPLETE (UACMP) WITH MICROSCOPIC - Abnormal; Notable for the following components:   Color, Urine YELLOW (*)    APPearance CLEAR (*)    All other components within normal limits  BRAIN NATRIURETIC PEPTIDE - Abnormal; Notable for the following components:   B Natriuretic Peptide 142.0 (*)    All other components within normal limits  LIPASE, BLOOD  TROPONIN I   ____________________________________________  EKG  ED ECG REPORT I, Lavonia Drafts, the attending physician, personally viewed and interpreted this ECG.  Date: 12/08/2017  Rhythm: normal sinus rhythm QRS Axis: normal Intervals: normal ST/T Wave abnormalities: normal Narrative Interpretation: no evidence of acute ischemia  ____________________________________________  RADIOLOGY  CT abdomen pelvis no acute distress ____________________________________________  PROCEDURES  Procedure(s) performed: No  Procedures   Critical Care performed: No ____________________________________________   INITIAL IMPRESSION / ASSESSMENT AND PLAN / ED COURSE  Pertinent labs & imaging results that were available during my care of the patient were reviewed by me and considered in my medical decision making (see chart for details).  Patient with a history of multiple abdominal surgeries presents after a fall with new lower abdominal pain.  Unclear whether her pain is related to her fall however she does have significant tenderness in the left lower quadrant and right lower quadrant.  History of diverticulitis with perforation, labs overall reassuring will obtain CT abdomen pelvis, give IV Dilaudid, IV Zofran and reevaluate  CT abdomen pelvis is overall unremarkable, no acute ab normalities.  After treatment with analgesics and nausea medication the patient feels significantly better.  She states she is ready to go home and will follow-up with her PCP.  I think this is a reasonable plan.  Return precautions discussed.     ____________________________________________   FINAL CLINICAL IMPRESSION(S) / ED DIAGNOSES  Final diagnoses:  Lower abdominal pain        Note:  This document was prepared using Dragon voice recognition software and may include unintentional dictation errors.      Lavonia Drafts, MD 12/08/17 1344

## 2017-12-08 NOTE — ED Triage Notes (Signed)
Pt arrived via POV with reports of sliding down steps 2 nights ago and having mid abdominal pain, back pain.  Pt c/o bilateral leg swelling since yesterday, R >L. 2-3+ pitting edema.   Pt states it feels like a stabbing sensation through to her back.

## 2018-01-04 ENCOUNTER — Emergency Department: Payer: Self-pay

## 2018-01-04 ENCOUNTER — Emergency Department
Admission: EM | Admit: 2018-01-04 | Discharge: 2018-01-04 | Disposition: A | Discharge status: Home / Self Care | Payer: Self-pay | Attending: Emergency Medicine | Admitting: Emergency Medicine

## 2018-01-04 ENCOUNTER — Other Ambulatory Visit: Payer: Self-pay

## 2018-01-04 DIAGNOSIS — R1110 Vomiting, unspecified: Secondary | ICD-10-CM | POA: Insufficient documentation

## 2018-01-04 DIAGNOSIS — K5900 Constipation, unspecified: Secondary | ICD-10-CM | POA: Insufficient documentation

## 2018-01-04 DIAGNOSIS — I10 Essential (primary) hypertension: Secondary | ICD-10-CM | POA: Insufficient documentation

## 2018-01-04 DIAGNOSIS — F17210 Nicotine dependence, cigarettes, uncomplicated: Secondary | ICD-10-CM | POA: Insufficient documentation

## 2018-01-04 DIAGNOSIS — K219 Gastro-esophageal reflux disease without esophagitis: Secondary | ICD-10-CM | POA: Insufficient documentation

## 2018-01-04 LAB — URINALYSIS, COMPLETE (UACMP) WITH MICROSCOPIC
Bacteria, UA: NONE SEEN
Bilirubin Urine: NEGATIVE
Glucose, UA: NEGATIVE mg/dL
Hgb urine dipstick: NEGATIVE
Ketones, ur: NEGATIVE mg/dL
Leukocytes, UA: NEGATIVE
Nitrite: NEGATIVE
Protein, ur: NEGATIVE mg/dL
Specific Gravity, Urine: 1.015 (ref 1.005–1.030)
pH: 7 (ref 5.0–8.0)

## 2018-01-04 LAB — CBC WITH DIFFERENTIAL/PLATELET
Basophils Absolute: 0.1 10*3/uL (ref 0–0.1)
Basophils Relative: 1 %
Eosinophils Absolute: 0.1 10*3/uL (ref 0–0.7)
Eosinophils Relative: 1 %
HCT: 35.3 % (ref 35.0–47.0)
Hemoglobin: 11.5 g/dL — ABNORMAL LOW (ref 12.0–16.0)
Lymphocytes Relative: 22 %
Lymphs Abs: 1.5 10*3/uL (ref 1.0–3.6)
MCH: 28.9 pg (ref 26.0–34.0)
MCHC: 32.7 g/dL (ref 32.0–36.0)
MCV: 88.4 fL (ref 80.0–100.0)
Monocytes Absolute: 0.8 10*3/uL (ref 0.2–0.9)
Monocytes Relative: 12 %
Neutro Abs: 4.2 10*3/uL (ref 1.4–6.5)
Neutrophils Relative %: 64 %
Platelets: 413 10*3/uL (ref 150–440)
RBC: 4 MIL/uL (ref 3.80–5.20)
RDW: 15.4 % — ABNORMAL HIGH (ref 11.5–14.5)
WBC: 6.6 10*3/uL (ref 3.6–11.0)

## 2018-01-04 LAB — COMPREHENSIVE METABOLIC PANEL
ALT: 12 U/L (ref 0–44)
AST: 22 U/L (ref 15–41)
Albumin: 3.5 g/dL (ref 3.5–5.0)
Alkaline Phosphatase: 81 U/L (ref 38–126)
Anion gap: 8 (ref 5–15)
BUN: 9 mg/dL (ref 6–20)
CO2: 24 mmol/L (ref 22–32)
Calcium: 9 mg/dL (ref 8.9–10.3)
Chloride: 101 mmol/L (ref 98–111)
Creatinine, Ser: 0.58 mg/dL (ref 0.44–1.00)
GFR calc Af Amer: >60 mL/min (ref >60)
GFR calc non Af Amer: >60 mL/min (ref >60)
Glucose, Bld: 106 mg/dL — ABNORMAL HIGH (ref 70–99)
Potassium: 4.6 mmol/L (ref 3.5–5.1)
Sodium: 133 mmol/L — ABNORMAL LOW (ref 135–145)
Total Bilirubin: 0.8 mg/dL (ref 0.3–1.2)
Total Protein: 7.1 g/dL (ref 6.5–8.1)

## 2018-01-04 LAB — TROPONIN I: Troponin I: <0.03 ng/mL (ref <0.03)

## 2018-01-04 LAB — LIPASE, BLOOD: Lipase: 21 U/L (ref 11–51)

## 2018-01-04 MED ORDER — OXYCODONE-ACETAMINOPHEN 5-325 MG PO TABS
1.0000 | ORAL_TABLET | Freq: Once | ORAL | Status: AC
  Administered 2018-01-04: 1 via ORAL
  Filled 2018-01-04: qty 1

## 2018-01-04 MED ORDER — SODIUM CHLORIDE 0.9 % IV SOLN
Freq: Once | INTRAVENOUS | Status: AC
  Administered 2018-01-04: 10:00:00 via INTRAVENOUS

## 2018-01-04 MED ORDER — SUCRALFATE 1 G PO TABS: 1.0000 g | ORAL_TABLET | Freq: Four times a day (QID) | ORAL | 1 refills | Status: DC

## 2018-01-04 MED ORDER — POLYETHYLENE GLYCOL 3350 17 GM/SCOOP PO POWD: 1.0000 | Freq: Once | ORAL | 0 refills | Status: AC

## 2018-01-04 MED ORDER — ONDANSETRON HCL 4 MG/2ML IJ SOLN
4.0000 mg | Freq: Once | INTRAMUSCULAR | Status: AC
  Administered 2018-01-04: 4 mg via INTRAVENOUS
  Filled 2018-01-04: qty 2

## 2018-01-04 MED ORDER — GI COCKTAIL ~~LOC~~
30.0000 mL | Freq: Once | ORAL | Status: AC
  Administered 2018-01-04: 30 mL via ORAL
  Filled 2018-01-04: qty 30

## 2018-01-04 MED ORDER — PANTOPRAZOLE SODIUM 40 MG PO TBEC: 40.0000 mg | DELAYED_RELEASE_TABLET | Freq: Every day | ORAL | 1 refills | Status: DC

## 2018-01-04 NOTE — ED Notes (Signed)
Friend is leaving [place]  [phone]-  Call if she needs anything.

## 2018-01-04 NOTE — ED Triage Notes (Signed)
abdominal pain and emesis after eating X 2 weeks. Right knee swelling. Pt alert and oriented X4, active, cooperative, pt in NAD. RR even and unlabored, color WNL.

## 2018-01-04 NOTE — ED Provider Notes (Signed)
[facility] Emergency Department Provider Note       Time seen: ----------------------------------------- 9:10 AM on 01/04/2018 -----------------------------------------   I have reviewed the triage vital signs and the nursing notes.  HISTORY   Chief Complaint Abdominal Pain    HPI Alison Walker is a 57 y.o. female with a history of diverticulitis, hypertension, WPW who presents to the ED for persistent abdominal pain and vomiting after eating for the past couple weeks.  Patient states she has run out of her omeprazole and she does have a history of peptic ulcer disease discovered on endoscopy several years ago.  She is also complaining of some peripheral edema.  She denies fevers, chills or other complaints.  Past Medical History:  Diagnosis Date  . Diverticulitis   . Hypertension   . S/P ablation operation for arrhythmia    for WPW  . Wolf-Parkinson-White syndrome     There are no active problems to display for this patient.   Past Surgical History:  Procedure Laterality Date  . ABDOMINAL SURGERY    . CHOLECYSTECTOMY    . COLONOSCOPY    . COLOSTOMY    . COLOSTOMY REVISION      Allergies Ciprofloxacin and Morphine  Social History Social History   Tobacco Use  . Smoking status: Current Every Day Smoker    Packs/day: 0.50  . Smokeless tobacco: Never Used  Substance Use Topics  . Alcohol use: No    Alcohol/week: 0.0 oz  . Drug use: No   Review of Systems Constitutional: Negative for fever. Cardiovascular: Negative for chest pain. Respiratory: Negative for shortness of breath. Gastrointestinal: Positive for abdominal pain and vomiting Musculoskeletal: Negative for back pain. Skin: Negative for rash. Neurological: Negative for headaches, focal weakness or numbness.  All systems negative/normal/unremarkable except as stated in the HPI  ____________________________________________   PHYSICAL EXAM:  VITAL SIGNS: ED Triage  Vitals [01/04/18 0846]  Enc Vitals Group     BP (!) 99/53     Pulse Rate 88     Resp 16     Temp 98.6 F (37 C)     Temp Source Oral     SpO2 97 %     Weight 170 lb (77.1 kg)     Height 5\' 2"  (1.575 m)     Head Circumference      Peak Flow      Pain Score 8     Pain Loc      Pain Edu?      Excl. in [place]?    Constitutional: Alert and oriented. Well appearing and in no distress. Eyes: Conjunctivae are normal. Normal extraocular movements. ENT   Head: Normocephalic and atraumatic.   Nose: No congestion/rhinnorhea.   Mouth/Throat: Mucous membranes are moist.   Neck: No stridor. Cardiovascular: Normal rate, regular rhythm. No murmurs, rubs, or gallops. Respiratory: Normal respiratory effort without tachypnea nor retractions. Breath sounds are clear and equal bilaterally. No wheezes/rales/rhonchi. Gastrointestinal: Soft and nontender. Normal bowel sounds Musculoskeletal: Nontender with normal range of motion in extremities. No lower extremity tenderness nor edema. Neurologic:  Normal speech and language. No gross focal neurologic deficits are appreciated.  Skin:  Skin is warm, dry and intact. No rash noted. Psychiatric: Mood and affect are normal. Speech and behavior are normal.  ____________________________________________  EKG: Interpreted by me.  Sinus rhythm rate of 80 bpm, normal PR interval, normal QRS, normal QT  ____________________________________________  ED COURSE:  As part of my medical decision making, I reviewed  the following data within the [place] History obtained from family if available, nursing notes, old chart and ekg, as well as notes from prior ED visits. Patient presented for abdominal pain and vomiting, we will assess with labs and imaging as indicated at this time.   Procedures ____________________________________________   LABS (pertinent positives/negatives)  Labs Reviewed  CBC WITH DIFFERENTIAL/PLATELET - Abnormal;  Notable for the following components:      Result Value   Hemoglobin 11.5 (*)    RDW 15.4 (*)    All other components within normal limits  COMPREHENSIVE METABOLIC PANEL - Abnormal; Notable for the following components:   Sodium 133 (*)    Glucose, Bld 106 (*)    All other components within normal limits  URINALYSIS, COMPLETE (UACMP) WITH MICROSCOPIC - Abnormal; Notable for the following components:   Color, Urine YELLOW (*)    APPearance HAZY (*)    All other components within normal limits  LIPASE, BLOOD  TROPONIN I    RADIOLOGY Images were viewed by me  Acute abdominal series IMPRESSION: Constipated appearance. ____________________________________________  DIFFERENTIAL DIAGNOSIS   GERD, peptic ulcer disease, dehydration, electrolyte abnormality, constipation  FINAL ASSESSMENT AND PLAN  Abdominal pain, vomiting   Plan: The patient had presented for abdominal pain and vomiting. Patient's labs are unremarkable. Patient's imaging revealed significant constipation.  She likely has a combination of GERD or peptic ulcer disease with constipation causing her constellation of symptoms.  She will be advised to take typical bowel prep and we will start her on Protonix and Carafate.  She is cleared for outpatient follow-up.   Laurence Aly, MD   Note: This note was generated in part or whole with voice recognition software. Voice recognition is usually quite accurate but there are transcription errors that can and very often do occur. I apologize for any typographical errors that were not detected and corrected.     Earleen Newport, MD 01/04/18 1052

## 2018-03-17 ENCOUNTER — Ambulatory Visit: Payer: Self-pay | Admitting: Family Medicine

## 2018-03-17 DIAGNOSIS — I456 Pre-excitation syndrome: Secondary | ICD-10-CM | POA: Insufficient documentation

## 2018-03-17 DIAGNOSIS — L0390 Cellulitis, unspecified: Secondary | ICD-10-CM | POA: Insufficient documentation

## 2018-03-17 NOTE — Progress Notes (Deleted)
Patient: Alison Walker, Female    DOB: 09-19-1960, 58 y.o.   MRN: [id number] Visit Date: 03/17/2018  Today's Provider: Lavon Paganini, MD   No chief complaint on file.  Subjective:  I, Tiburcio Pea, CMA, am acting as a scribe for Lavon Paganini, MD.   New Patient:  Alison Walker is a 57 year old female who presents today to [place] as a new patient.  -----------------------------------------------------------------   Review of Systems  Constitutional: Negative.   HENT: Negative.   Eyes: Negative.   Respiratory: Negative.   Cardiovascular: Negative.   Gastrointestinal: Negative.   Endocrine: Negative.   Genitourinary: Negative.   Musculoskeletal: Negative.   Skin: Negative.   Allergic/Immunologic: Negative.   Neurological: Negative.   Hematological: Negative.   Psychiatric/Behavioral: Negative.     Social History      She  reports that she has been smoking. She has been smoking about 0.50 packs per day. She has never used smokeless tobacco. She reports that she does not drink alcohol or use drugs.       Social History   Socioeconomic History  . Marital status: Widowed    Spouse name: Not on file  . Number of children: Not on file  . Years of education: Not on file  . Highest education level: Not on file  Occupational History  . Not on file  Social Needs  . Financial resource strain: Not on file  . Food insecurity:    Worry: Not on file    Inability: Not on file  . Transportation needs:    Medical: Not on file    Non-medical: Not on file  Tobacco Use  . Smoking status: Current Every Day Smoker    Packs/day: 0.50  . Smokeless tobacco: Never Used  Substance and Sexual Activity  . Alcohol use: No    Alcohol/week: 0.0 standard drinks  . Drug use: No  . Sexual activity: Never  Lifestyle  . Physical activity:    Days per week: Not on file    Minutes per session: Not on file  . Stress: Not on file  Relationships  . Social connections:      Talks on phone: Not on file    Gets together: Not on file    Attends religious service: Not on file    Active member of club or organization: Not on file    Attends meetings of clubs or organizations: Not on file    Relationship status: Not on file  Other Topics Concern  . Not on file  Social History Narrative  . Not on file    Past Medical History:  Diagnosis Date  . Diverticulitis   . Hypertension   . S/P ablation operation for arrhythmia    for WPW  . Wolf-Parkinson-White syndrome      Patient Active Problem List   Diagnosis Date Noted  . Cellulitis 03/17/2018  . WPW (Wolff-Parkinson-White syndrome) 03/17/2018  . Acquired hammertoe of right foot 03/19/2017  . Hallux rigidus of right foot 03/19/2017  . Ventral hernia without obstruction or gangrene 10/15/2014  . Anxiety state 04/07/2013  . Chronic pain syndrome 04/07/2013  . Heart disease 04/07/2013  . Neuropathic pain 04/07/2013  . Old myocardial infarction 04/07/2013  . Sialoadenitis 04/07/2013  . Tobacco use disorder 04/07/2013  . Intestinal obstruction ([place]) 12/18/2010    Past Surgical History:  Procedure Laterality Date  . ABDOMINAL SURGERY    . CHOLECYSTECTOMY    . COLONOSCOPY    .  COLOSTOMY    . COLOSTOMY REVISION      Family History        Family Status  Relation Name Status  . Mother  (Not Specified)  . Other  (Not Specified)        Her family history includes Cancer in her mother and other.      Allergies  Allergen Reactions  . Ciprofloxacin Rash  . Morphine Rash     Current Outpatient Medications:  .  ALPRAZolam (XANAX) 0.5 MG tablet, Take 0.5 mg by mouth at bedtime as needed for anxiety., Disp: , Rfl:  .  aspirin EC 81 MG tablet, Take 81 mg by mouth., Disp: , Rfl:  .  atenolol (TENORMIN) 50 MG tablet, Take 50 mg by mouth., Disp: , Rfl:  .  atorvastatin (LIPITOR) 40 MG tablet, Take 40 mg by mouth daily., Disp: , Rfl:  .  desipramine (NORPRAMIN) 50 MG tablet, Take 50 mg by mouth.,  Disp: , Rfl:  .  dicyclomine (BENTYL) 20 MG tablet, Take 20 mg by mouth every 6 (six) hours., Disp: , Rfl:  .  escitalopram (LEXAPRO) 20 MG tablet, Take 20 mg by mouth daily., Disp: , Rfl:  .  lisinopril (PRINIVIL,ZESTRIL) 10 MG tablet, Take 10 mg by mouth., Disp: , Rfl:  .  methocarbamol (ROBAXIN) 750 MG tablet, Take 1 tablet (750 mg total) by mouth 4 (four) times daily., Disp: 40 tablet, Rfl: 0 .  omeprazole (PRILOSEC) 40 MG capsule, Take 40 mg by mouth daily., Disp: , Rfl:  .  ondansetron (ZOFRAN ODT) 4 MG disintegrating tablet, Take 1 tablet (4 mg total) by mouth every 8 (eight) hours as needed for nausea or vomiting., Disp: 20 tablet, Rfl: 0 .  oxyCODONE-acetaminophen (ROXICET) 5-325 MG tablet, Take 1-2 tablets by mouth every 4 (four) hours as needed for severe pain., Disp: 30 tablet, Rfl: 0 .  pantoprazole (PROTONIX) 40 MG tablet, Take 1 tablet (40 mg total) by mouth daily., Disp: 30 tablet, Rfl: 1 .  Sennosides (SENNA) 8.6 MG CAPS, Take by mouth., Disp: , Rfl:  .  sucralfate (CARAFATE) 1 g tablet, Take 1 tablet (1 g total) by mouth 4 (four) times daily., Disp: 120 tablet, Rfl: 1 .  terbinafine (LAMISIL) 250 MG tablet, Take 250 mg by mouth daily., Disp: , Rfl:  .  traMADol (ULTRAM) 50 MG tablet, Take 1 tablet (50 mg total) by mouth every 6 (six) hours as needed., Disp: 20 tablet, Rfl: 0   Patient Care Team: Birdie Sons, MD as PCP - General (Family Medicine)      Objective:   Vitals: There were no vitals taken for this visit.  There were no vitals filed for this visit.   Physical Exam   Depression Screen No flowsheet data found.    Assessment & Plan:     Routine Health Maintenance and Physical Exam  Exercise Activities and Dietary recommendations Goals   None     Immunization History  Administered Date(s) Administered  . Influenza, Seasonal, Injecte, Preservative Fre 07/29/2011    Health Maintenance  Topic Date Due  . Hepatitis C Screening  [birth year]  .  HIV Screening  05/27/1974  . TETANUS/TDAP  05/27/1978  . PAP SMEAR  05/27/1980  . MAMMOGRAM  05/27/2009  . COLONOSCOPY  05/27/2009  . INFLUENZA VACCINE  02/10/2018     Discussed health benefits of physical activity, and encouraged her to engage in regular exercise appropriate for her age and condition.    --------------------------------------------------------------------  Lavon Paganini, MD  [facility]

## 2018-11-25 ENCOUNTER — Emergency Department: Payer: Medicaid Other

## 2018-11-25 ENCOUNTER — Other Ambulatory Visit: Payer: Self-pay

## 2018-11-25 ENCOUNTER — Emergency Department
Admission: EM | Admit: 2018-11-25 | Discharge: 2018-11-25 | Disposition: A | Discharge status: Home / Self Care | Payer: Medicaid Other | Attending: Emergency Medicine | Admitting: Emergency Medicine

## 2018-11-25 DIAGNOSIS — I252 Old myocardial infarction: Secondary | ICD-10-CM | POA: Diagnosis not present

## 2018-11-25 DIAGNOSIS — W109XXA Fall (on) (from) unspecified stairs and steps, initial encounter: Secondary | ICD-10-CM | POA: Insufficient documentation

## 2018-11-25 DIAGNOSIS — Z79899 Other long term (current) drug therapy: Secondary | ICD-10-CM | POA: Diagnosis not present

## 2018-11-25 DIAGNOSIS — F17210 Nicotine dependence, cigarettes, uncomplicated: Secondary | ICD-10-CM | POA: Insufficient documentation

## 2018-11-25 DIAGNOSIS — I10 Essential (primary) hypertension: Secondary | ICD-10-CM | POA: Insufficient documentation

## 2018-11-25 DIAGNOSIS — M5431 Sciatica, right side: Secondary | ICD-10-CM

## 2018-11-25 DIAGNOSIS — Z7982 Long term (current) use of aspirin: Secondary | ICD-10-CM | POA: Insufficient documentation

## 2018-11-25 DIAGNOSIS — M25561 Pain in right knee: Secondary | ICD-10-CM | POA: Diagnosis not present

## 2018-11-25 MED ORDER — HYDROCODONE-ACETAMINOPHEN 5-325 MG PO TABS: 1.0000 | ORAL_TABLET | ORAL | 0 refills | Status: AC | PRN

## 2018-11-25 MED ORDER — KETOROLAC TROMETHAMINE 30 MG/ML IJ SOLN
30.0000 mg | Freq: Once | INTRAMUSCULAR | Status: AC
  Administered 2018-11-25: 10:00:00 30 mg via INTRAMUSCULAR
  Filled 2018-11-25: qty 1

## 2018-11-25 MED ORDER — HYDROCODONE-ACETAMINOPHEN 5-325 MG PO TABS
1.0000 | ORAL_TABLET | Freq: Once | ORAL | Status: AC
  Administered 2018-11-25: 11:00:00 1 via ORAL
  Filled 2018-11-25: qty 1

## 2018-11-25 MED ORDER — PREDNISONE 10 MG (21) PO TBPK: ORAL_TABLET | ORAL | 0 refills | Status: DC

## 2018-11-25 MED ORDER — COMMODE BEDSIDE MISC: 1.0000 | Freq: Once | 0 refills | Status: AC

## 2018-11-25 MED ORDER — ORPHENADRINE CITRATE 30 MG/ML IJ SOLN
60.0000 mg | Freq: Two times a day (BID) | INTRAMUSCULAR | Status: DC
  Administered 2018-11-25: 60 mg via INTRAMUSCULAR
  Filled 2018-11-25: qty 2

## 2018-11-25 NOTE — ED Provider Notes (Signed)
[facility] Emergency Department Provider Note ____________________________________________  Time seen: Approximately 11:00 AM  I have reviewed the triage vital signs and the nursing notes.   HISTORY  Chief Complaint Fall    HPI Alison Walker is a 58 y.o. female who presents to the emergency department for evaluation and treatment of right side back and right knee pain.  States that she was attempting to walk up stairs 2 days ago when her right knee decided to "give way."  She states that she slid down 2 or 3 stairs and since that time has had pain on the right buttock and hip that radiates into the lower extremity.  She states that she has had a history of sciatica and feels that this is the same pain.  She states that she has taken multiple over-the-counter medications without any relief.  Pain in the knee has increased over the past 24 hours and she now cannot lay it straight out without severe pain. Past Medical History:  Diagnosis Date  . Diverticulitis   . Hypertension   . S/P ablation operation for arrhythmia    for WPW  . Wolf-Parkinson-White syndrome     Patient Active Problem List   Diagnosis Date Noted  . Cellulitis 03/17/2018  . WPW (Wolff-Parkinson-White syndrome) 03/17/2018  . Acquired hammertoe of right foot 03/19/2017  . Hallux rigidus of right foot 03/19/2017  . Ventral hernia without obstruction or gangrene 10/15/2014  . Anxiety state 04/07/2013  . Chronic pain syndrome 04/07/2013  . Heart disease 04/07/2013  . Neuropathic pain 04/07/2013  . Old myocardial infarction 04/07/2013  . Sialoadenitis 04/07/2013  . Tobacco use disorder 04/07/2013  . Intestinal obstruction ([employer]) 12/18/2010    Past Surgical History:  Procedure Laterality Date  . ABDOMINAL SURGERY    . CHOLECYSTECTOMY    . COLONOSCOPY    . COLOSTOMY    . COLOSTOMY REVISION      Prior to Admission medications   Medication Sig Start Date End Date Taking? Authorizing  Provider  ALPRAZolam Duanne Moron) 0.5 MG tablet Take 0.5 mg by mouth at bedtime as needed for anxiety.    [provider]  aspirin EC 81 MG tablet Take 81 mg by mouth.    [provider]  atenolol (TENORMIN) 50 MG tablet Take 50 mg by mouth. 10/26/13   [provider]  atorvastatin (LIPITOR) 40 MG tablet Take 40 mg by mouth daily.    [provider]  desipramine (NORPRAMIN) 50 MG tablet Take 50 mg by mouth. 09/14/13   [provider]  dicyclomine (BENTYL) 20 MG tablet Take 20 mg by mouth every 6 (six) hours.    [provider]  escitalopram (LEXAPRO) 20 MG tablet Take 20 mg by mouth daily.    [provider]  HYDROcodone-acetaminophen (NORCO/VICODIN) 5-325 MG tablet Take 1 tablet by mouth every 4 (four) hours as needed for moderate pain. 11/25/18 11/25/19  Kaiven Vester, Johnette Abraham B, FNP  lisinopril (PRINIVIL,ZESTRIL) 10 MG tablet Take 10 mg by mouth. 10/26/13   [provider]  methocarbamol (ROBAXIN) 750 MG tablet Take 1 tablet (750 mg total) by mouth 4 (four) times daily. 02/25/16   Arlyss Repress, PA-C  Misc. Devices (COMMODE BEDSIDE) MISC 1 Device by Does not apply route once for 1 dose. 11/25/18 11/25/18  Teigen Parslow, Johnette Abraham B, FNP  omeprazole (PRILOSEC) 40 MG capsule Take 40 mg by mouth daily.    [provider]  ondansetron (ZOFRAN ODT) 4 MG disintegrating tablet Take 1 tablet (  4 mg total) by mouth every 8 (eight) hours as needed for nausea or vomiting. 08/14/15   Eula Listen, MD  pantoprazole (PROTONIX) 40 MG tablet Take 1 tablet (40 mg total) by mouth daily. 01/04/18 01/04/19  Earleen Newport, MD  predniSONE (STERAPRED UNI-PAK 21 TAB) 10 MG (21) TBPK tablet Take 6 tablets on the first day and decrease by 1 tablet each day until finished. 11/25/18   Teofil Maniaci B, FNP  Sennosides (SENNA) 8.6 MG CAPS Take by mouth. 10/26/13   [provider]  sucralfate (CARAFATE) 1 g tablet Take 1 tablet (1 g total) by mouth 4 (four)  times daily. 01/04/18 01/04/19  Earleen Newport, MD  terbinafine (LAMISIL) 250 MG tablet Take 250 mg by mouth daily.    [provider]  traMADol (ULTRAM) 50 MG tablet Take 1 tablet (50 mg total) by mouth every 6 (six) hours as needed. 12/08/17 12/08/18  Lavonia Drafts, MD    Allergies Ciprofloxacin and Morphine  Family History  Problem Relation Age of Onset  . Cancer Mother   . Cancer Other     Social History Social History   Tobacco Use  . Smoking status: Current Some Day Smoker    Packs/day: 0.50    Types: Cigarettes  . Smokeless tobacco: Never Used  Substance Use Topics  . Alcohol use: No    Alcohol/week: 0.0 standard drinks  . Drug use: No    Review of Systems Constitutional: Negative for fever. Cardiovascular: Negative for chest pain. Respiratory: Negative for shortness of breath. Musculoskeletal: Positive for right side lower back and right lower extremity pain Skin: Negative for open wound or lesion Neurological: Negative for decrease in sensation  ____________________________________________   PHYSICAL EXAM:  VITAL SIGNS: ED Triage Vitals  Enc Vitals Group     BP 11/25/18 0936 (!) 144/81     Pulse Rate 11/25/18 0936 94     Resp 11/25/18 0936 18     Temp 11/25/18 0936 98.7 F (37.1 C)     Temp Source 11/25/18 0936 Oral     SpO2 11/25/18 0928 98 %     Weight 11/25/18 0937 142 lb (64.4 kg)     Height 11/25/18 0937 5\' 2"  (1.575 m)     Head Circumference --      Peak Flow --      Pain Score 11/25/18 0937 10     Pain Loc --      Pain Edu? --      Excl. in [place]? --     Constitutional: Alert and oriented. Well appearing and in no acute distress. Eyes: Conjunctivae are clear without discharge or drainage Head: Atraumatic Neck: Supple.  No focal midline tenderness. Respiratory: No cough. Respirations are even and unlabored. Musculoskeletal: Limited extension of the right knee secondary to pain.  Negative straight leg raise.  No focal tenderness  over the lumbar spine. Neurologic: Motor and sensory function of the right lower extremity is intact. Skin: No open wounds or lesions noted over the right lower extremity. Psychiatric: Affect and behavior are appropriate.  ____________________________________________   LABS (all labs ordered are listed, but only abnormal results are displayed)  Labs Reviewed - No data to display ____________________________________________  RADIOLOGY  Right knee shows a small joint effusion and some small weightbearing compartmental marginal osteophytes without any visible fracture. ____________________________________________   PROCEDURES  .Splint Application Date/Time: 1/61/0960 3:30 PM Performed by: Victorino Dike, FNP Authorized by: Victorino Dike, FNP   Consent:  Consent obtained:  Verbal   Consent given by:  Patient Pre-procedure details:    Sensation:  Normal Procedure details:    Laterality:  Right   Location:  Knee   Splint type:  Knee immobilizer Post-procedure details:    Pain:  Unchanged   Sensation:  Normal   Patient tolerance of procedure:  Tolerated well, no immediate complications    ____________________________________________   INITIAL IMPRESSION / ASSESSMENT AND PLAN / ED COURSE  Alison Walker is a 58 y.o. who presents to the emergency department for treatment and evaluation of right-sided lower back pain with radiation into the right lower extremity.  She also complaining of right knee pain after a mechanical, non-syncopal fall that occurred 2 days ago.  Image of the right knee does not show any acute bony abnormalities.  She will be placed in a knee immobilizer.  While here, she was given Toradol, Norflex, and Norco with some improvement in pain.  Patient will be discharged home to follow-up with orthopedics if not improving over the week.  She will be given a prescription for Norco for the knee pain and prednisone for the sciatica.  Patient also requested a  prescription for bedside commode as her room is upstairs in the home.  She was encouraged to return to the emergency department for symptoms of concern if unable to schedule an appointment with primary care or orthopedics.   Medications  ketorolac (TORADOL) 30 MG/ML injection 30 mg (30 mg Intramuscular Given 11/25/18 0949)  HYDROcodone-acetaminophen (NORCO/VICODIN) 5-325 MG per tablet 1 tablet (1 tablet Oral Given 11/25/18 1047)    Pertinent labs & imaging results that were available during my care of the patient were reviewed by me and considered in my medical decision making (see chart for details).  _________________________________________   FINAL CLINICAL IMPRESSION(S) / ED DIAGNOSES  Final diagnoses:  Acute pain of right knee  Sciatica of right side    ED Discharge Orders         Ordered    HYDROcodone-acetaminophen (NORCO/VICODIN) 5-325 MG tablet  Every 4 hours PRN     11/25/18 1033    Misc. Devices (COMMODE BEDSIDE) MISC   Once     11/25/18 1033    predniSONE (STERAPRED UNI-PAK 21 TAB) 10 MG (21) TBPK tablet     11/25/18 1042           If controlled substance prescribed during this visit, 12 month history viewed on the [place] prior to issuing an initial prescription for Schedule II or III opiod.   Victorino Dike, FNP 11/25/18 1533    Arta Silence, MD 11/25/18 1540

## 2018-11-25 NOTE — Discharge Instructions (Signed)
Please follow up with your primary care provider or orthopedics for symptoms that are not improving over the next week.   Be aware that the pain medication may make you drowsy and dizzy. Do not go down the steps if you are feeling weak or dizzy without someone to assist you.   Return to the ER for symptoms that change or worsen if unable to schedule an appointment.

## 2018-11-25 NOTE — ED Triage Notes (Signed)
Pt arrived to ed via ems from home. Ems reports fall on wed night at home sliding down several stairs, pt contributed fall to sciatica, and right knee problems. Pt denies hitting head of any LOC from fall. Pt states she was hurting bad last night and unable to walk at home. Bruising noted on inside of right arm from fall. Ems reported that pt was able to walk with assistance to stretcher at home. Pt a&o x 4 on arrival NAD noted at this time

## 2019-02-27 ENCOUNTER — Ambulatory Visit: Payer: Self-pay | Admitting: Physician Assistant

## 2019-07-27 ENCOUNTER — Emergency Department
Admission: EM | Admit: 2019-07-27 | Discharge: 2019-07-28 | Disposition: A | Discharge status: Home / Self Care | Payer: Medicaid Other | Attending: Emergency Medicine | Admitting: Emergency Medicine

## 2019-07-27 ENCOUNTER — Encounter: Payer: Self-pay | Admitting: Emergency Medicine

## 2019-07-27 ENCOUNTER — Other Ambulatory Visit: Payer: Self-pay

## 2019-07-27 ENCOUNTER — Emergency Department: Payer: Medicaid Other

## 2019-07-27 DIAGNOSIS — R110 Nausea: Secondary | ICD-10-CM | POA: Diagnosis not present

## 2019-07-27 DIAGNOSIS — R509 Fever, unspecified: Secondary | ICD-10-CM | POA: Diagnosis not present

## 2019-07-27 DIAGNOSIS — N823 Fistula of vagina to large intestine: Secondary | ICD-10-CM

## 2019-07-27 DIAGNOSIS — Z79899 Other long term (current) drug therapy: Secondary | ICD-10-CM | POA: Insufficient documentation

## 2019-07-27 DIAGNOSIS — F17210 Nicotine dependence, cigarettes, uncomplicated: Secondary | ICD-10-CM | POA: Diagnosis not present

## 2019-07-27 DIAGNOSIS — K529 Noninfective gastroenteritis and colitis, unspecified: Secondary | ICD-10-CM

## 2019-07-27 DIAGNOSIS — R1032 Left lower quadrant pain: Secondary | ICD-10-CM | POA: Diagnosis present

## 2019-07-27 DIAGNOSIS — I10 Essential (primary) hypertension: Secondary | ICD-10-CM | POA: Insufficient documentation

## 2019-07-27 LAB — URINALYSIS, COMPLETE (UACMP) WITH MICROSCOPIC
Bacteria, UA: NONE SEEN
Bilirubin Urine: NEGATIVE
Glucose, UA: NEGATIVE mg/dL
Hgb urine dipstick: NEGATIVE
Ketones, ur: NEGATIVE mg/dL
Leukocytes,Ua: NEGATIVE
Nitrite: POSITIVE — AB
Protein, ur: NEGATIVE mg/dL
Specific Gravity, Urine: 1.01 (ref 1.005–1.030)
pH: 5 (ref 5.0–8.0)

## 2019-07-27 LAB — CBC
HCT: 41.1 % (ref 36.0–46.0)
Hemoglobin: 13.5 g/dL (ref 12.0–15.0)
MCH: 30.3 pg (ref 26.0–34.0)
MCHC: 32.8 g/dL (ref 30.0–36.0)
MCV: 92.2 fL (ref 80.0–100.0)
Platelets: 263 10*3/uL (ref 150–400)
RBC: 4.46 MIL/uL (ref 3.87–5.11)
RDW: 13.8 % (ref 11.5–15.5)
WBC: 6.5 10*3/uL (ref 4.0–10.5)
nRBC: 0 % (ref 0.0–0.2)

## 2019-07-27 LAB — COMPREHENSIVE METABOLIC PANEL
ALT: 14 U/L (ref 0–44)
AST: 18 U/L (ref 15–41)
Albumin: 4.1 g/dL (ref 3.5–5.0)
Alkaline Phosphatase: 72 U/L (ref 38–126)
Anion gap: 8 (ref 5–15)
BUN: 14 mg/dL (ref 6–20)
CO2: 27 mmol/L (ref 22–32)
Calcium: 9.4 mg/dL (ref 8.9–10.3)
Chloride: 100 mmol/L (ref 98–111)
Creatinine, Ser: 0.6 mg/dL (ref 0.44–1.00)
GFR calc Af Amer: >60 mL/min (ref >60)
GFR calc non Af Amer: >60 mL/min (ref >60)
Glucose, Bld: 87 mg/dL (ref 70–99)
Potassium: 4.2 mmol/L (ref 3.5–5.1)
Sodium: 135 mmol/L (ref 135–145)
Total Bilirubin: 0.7 mg/dL (ref 0.3–1.2)
Total Protein: 7.1 g/dL (ref 6.5–8.1)

## 2019-07-27 LAB — TYPE AND SCREEN
ABO/RH(D): O NEG
Antibody Screen: NEGATIVE

## 2019-07-27 LAB — LIPASE, BLOOD: Lipase: 16 U/L (ref 11–51)

## 2019-07-27 MED ORDER — AMOXICILLIN-POT CLAVULANATE 875-125 MG PO TABS: 1.0000 | ORAL_TABLET | Freq: Two times a day (BID) | ORAL | 0 refills | Status: DC

## 2019-07-27 MED ORDER — SODIUM CHLORIDE 0.9% FLUSH
3.0000 mL | Freq: Once | INTRAVENOUS | Status: AC
  Administered 2019-07-27: 20:00:00 3 mL via INTRAVENOUS

## 2019-07-27 MED ORDER — OXYCODONE-ACETAMINOPHEN 5-325 MG PO TABS
1.0000 | ORAL_TABLET | Freq: Once | ORAL | Status: AC
  Administered 2019-07-28: 01:00:00 1 via ORAL
  Filled 2019-07-27: qty 1

## 2019-07-27 MED ORDER — ONDANSETRON 4 MG PO TBDP: 4.0000 mg | ORAL_TABLET | Freq: Three times a day (TID) | ORAL | 0 refills | Status: DC | PRN

## 2019-07-27 MED ORDER — ONDANSETRON HCL 4 MG/2ML IJ SOLN
4.0000 mg | Freq: Once | INTRAMUSCULAR | Status: AC
  Administered 2019-07-27: 20:00:00 4 mg via INTRAVENOUS
  Filled 2019-07-27: qty 2

## 2019-07-27 MED ORDER — IOHEXOL 9 MG/ML PO SOLN
500.0000 mL | ORAL | Status: AC
  Administered 2019-07-27: 20:00:00 500 mL via ORAL

## 2019-07-27 MED ORDER — OXYCODONE-ACETAMINOPHEN 5-325 MG PO TABS: 1.0000 | ORAL_TABLET | Freq: Four times a day (QID) | ORAL | 0 refills | Status: DC | PRN

## 2019-07-27 MED ORDER — FENTANYL CITRATE (PF) 100 MCG/2ML IJ SOLN
75.0000 ug | Freq: Once | INTRAMUSCULAR | Status: AC
  Administered 2019-07-27: 20:00:00 75 ug via INTRAVENOUS
  Filled 2019-07-27: qty 2

## 2019-07-27 MED ORDER — SODIUM CHLORIDE 0.9 % IV BOLUS
1000.0000 mL | Freq: Once | INTRAVENOUS | Status: AC
  Administered 2019-07-27: 20:00:00 1000 mL via INTRAVENOUS

## 2019-07-27 MED ORDER — METRONIDAZOLE 500 MG PO TABS
500.0000 mg | ORAL_TABLET | Freq: Once | ORAL | Status: AC
  Administered 2019-07-28: 01:00:00 500 mg via ORAL
  Filled 2019-07-27: qty 1

## 2019-07-27 MED ORDER — IOHEXOL 300 MG/ML  SOLN
100.0000 mL | Freq: Once | INTRAMUSCULAR | Status: AC | PRN
  Administered 2019-07-27: 22:00:00 100 mL via INTRAVENOUS

## 2019-07-27 MED ORDER — SODIUM CHLORIDE 0.9 % IV SOLN
2.0000 g | Freq: Once | INTRAVENOUS | Status: AC
  Administered 2019-07-28: 01:00:00 2 g via INTRAVENOUS
  Filled 2019-07-27: qty 20

## 2019-07-27 MED ORDER — HYDROMORPHONE HCL 1 MG/ML IJ SOLN
1.0000 mg | Freq: Once | INTRAMUSCULAR | Status: AC
  Administered 2019-07-27: 1 mg via INTRAVENOUS
  Filled 2019-07-27: qty 1

## 2019-07-27 NOTE — ED Notes (Signed)
Pt call light ringing, this tech currently at pt bedside and pt angry with this tech stating "ya'll aren't answering me, what the hell is going on? I was told I would be getting a scan and ya'll haven't done anything" this tech apologized to pt and explained that we were very busy and I would call CT to inform them that she was done with contrast. CT called and Pam notified that pt was finished

## 2019-07-27 NOTE — ED Provider Notes (Signed)
[facility] Emergency Department Provider Note  ____________________________________________   First MD Initiated Contact with Patient 07/27/19 1827     (approximate)  I have reviewed the triage vital signs and the nursing notes.   HISTORY  Chief Complaint Abdominal Pain    HPI Alison Walker is a 59 y.o. female  Here with abdominal pain. Pt reports that over the past 2-3 days, she's had progressively worsening sharp, stabbing, LLQ abd pain. She states that over the past 24 hours, she began to have loose, black diarrhea along with worsening pain. She then began to feel pain shooting toawrd her vagina, with passage of stool and air via her vagina when having a BM. 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 No urinary sx. No fever, chills. No recent med changes. No SOB, cough, CP.        Past Medical History:  Diagnosis Date  . Diverticulitis   . Hypertension   . S/P ablation operation for arrhythmia    for WPW  . Wolf-Parkinson-White syndrome     Patient Active Problem List   Diagnosis Date Noted  . Cellulitis 03/17/2018  . WPW (Wolff-Parkinson-White syndrome) 03/17/2018  . Acquired hammertoe of right foot 03/19/2017  . Hallux rigidus of right foot 03/19/2017  . Ventral hernia without obstruction or gangrene 10/15/2014  . Anxiety state 04/07/2013  . Chronic pain syndrome 04/07/2013  . Heart disease 04/07/2013  . Neuropathic pain 04/07/2013  . Old myocardial infarction 04/07/2013  . Sialoadenitis 04/07/2013  . Tobacco use disorder 04/07/2013  . Intestinal obstruction ([place]) 12/18/2010    Past Surgical History:  Procedure Laterality Date  . ABDOMINAL SURGERY    . CHOLECYSTECTOMY    . COLONOSCOPY    . COLOSTOMY    . COLOSTOMY REVISION      Prior to Admission medications   Medication Sig Start Date End Date Taking? Authorizing Provider  ALPRAZolam Duanne Moron) 0.5 MG tablet Take 0.5 mg by mouth at bedtime as needed for  anxiety.    [provider]  amoxicillin-clavulanate (AUGMENTIN) 875-125 MG tablet Take 1 tablet by mouth 2 (two) times daily for 7 days. 07/27/19 08/03/19  Duffy Bruce, MD  aspirin EC 81 MG tablet Take 81 mg by mouth.    [provider]  atenolol (TENORMIN) 50 MG tablet Take 50 mg by mouth. 10/26/13   [provider]  atorvastatin (LIPITOR) 40 MG tablet Take 40 mg by mouth daily.    [provider]  desipramine (NORPRAMIN) 50 MG tablet Take 50 mg by mouth. 09/14/13   [provider]  dicyclomine (BENTYL) 20 MG tablet Take 20 mg by mouth every 6 (six) hours.    [provider]  escitalopram (LEXAPRO) 20 MG tablet Take 20 mg by mouth daily.    [provider]  HYDROcodone-acetaminophen (NORCO/VICODIN) 5-325 MG tablet Take 1 tablet by mouth every 4 (four) hours as needed for moderate pain. 11/25/18 11/25/19  Triplett, Johnette Abraham B, FNP  lisinopril (PRINIVIL,ZESTRIL) 10 MG tablet Take 10 mg by mouth. 10/26/13   [provider]  methocarbamol (ROBAXIN) 750 MG tablet Take 1 tablet (750 mg total) by mouth 4 (four) times daily. 02/25/16   Beers, Pierce Crane, PA-C  nystatin (MYCOSTATIN/NYSTOP) powder Apply 1 application topically 3 (three) times daily for 10 days. To skin rash/redness in skin folds 07/28/19 08/07/19  Duffy Bruce, MD  omeprazole (PRILOSEC) 40 MG capsule Take 40 mg by mouth daily.    [provider]  ondansetron (ZOFRAN ODT) 4 MG disintegrating tablet Take 1 tablet (4 mg total) by mouth every 8 (eight) hours as needed for nausea or vomiting. 07/27/19   Duffy Bruce, MD  oxyCODONE-acetaminophen (PERCOCET) 5-325 MG tablet Take 1-2 tablets by mouth every 6 (six) hours as needed for moderate pain or severe pain. 07/27/19 07/26/20  Duffy Bruce, MD  pantoprazole (PROTONIX) 40 MG tablet Take 1 tablet (40 mg total) by mouth daily. 01/04/18 01/04/19  Earleen Newport, MD  predniSONE (STERAPRED UNI-PAK 21 TAB) 10 MG (21) TBPK  tablet Take 6 tablets on the first day and decrease by 1 tablet each day until finished. 11/25/18   Triplett, Cari B, FNP  Sennosides (SENNA) 8.6 MG CAPS Take by mouth. 10/26/13   [provider]  sucralfate (CARAFATE) 1 g tablet Take 1 tablet (1 g total) by mouth 4 (four) times daily. 01/04/18 01/04/19  Earleen Newport, MD  terbinafine (LAMISIL) 250 MG tablet Take 250 mg by mouth daily.    [provider]    Allergies Ciprofloxacin and Morphine  Family History  Problem Relation Age of Onset  . Cancer Mother   . Cancer Other     Social History Social History   Tobacco Use  . Smoking status: Current Some Day Smoker    Packs/day: 0.50    Types: Cigarettes  . Smokeless tobacco: Never Used  Substance Use Topics  . Alcohol use: No    Alcohol/week: 0.0 standard drinks  . Drug use: No    Review of Systems  Review of Systems  Constitutional: Positive for fatigue and fever.  HENT: Negative for congestion and sore throat.   Eyes: Negative for visual disturbance.  Respiratory: Negative for cough and shortness of breath.   Cardiovascular: Negative for chest pain.  Gastrointestinal: Positive for abdominal pain, blood in stool and nausea. Negative for diarrhea and vomiting.  Genitourinary: Positive for vaginal bleeding, vaginal discharge and vaginal pain. Negative for flank pain.  Musculoskeletal: Negative for back pain and neck pain.  Skin: Negative for rash and wound.  Neurological: Negative for weakness.  All other systems reviewed and are negative.    ____________________________________________  PHYSICAL EXAM:      VITAL SIGNS: ED Triage Vitals [07/27/19 1432]  Enc Vitals Group     BP (!) 136/103     Pulse Rate 91     Resp 16     Temp 99 F (37.2 C)     Temp Source Oral     SpO2 95 %     Weight 167 lb (75.8 kg)     Height 5\' 2"  (1.575 m)     Head Circumference      Peak Flow      Pain Score 8     Pain Loc      Pain Edu?      Excl. in [place]?       Physical Exam Vitals and nursing note reviewed.  Constitutional:      General: She is not in acute distress.    Appearance: She is well-developed.  HENT:     Head: Normocephalic and atraumatic.  Eyes:     Conjunctiva/sclera: Conjunctivae normal.  Cardiovascular:     Rate and Rhythm: Normal rate and regular rhythm.     Heart sounds: Normal heart sounds. No murmur. No friction rub.  Pulmonary:     Effort: Pulmonary effort is normal. No respiratory distress.     Breath sounds: Normal breath sounds. No wheezing or rales.  Abdominal:     General: Abdomen is flat. There is no distension.     Palpations: Abdomen is soft.     Tenderness: There is abdominal tenderness in the suprapubic area, left upper quadrant and left lower quadrant. There is no guarding or rebound.     Comments: Multiple surgical scars noted, well healed. Mild erythema w/ satellite lesions noted along fold of inferior pannus, without induration or fluctuance.  Genitourinary:    Comments: Mild erythema of labia majora externally. On speculum exam, small volume of yellow-white vaginal discharge noted. Cervical os is closed, non-tender. There is no appreciable fecal matter or visible mucosal lacerations, fistulae, or breakdown. On digital exam, no appreciable breakdown of rectovaginal barrier. Musculoskeletal:     Cervical back: Neck supple.  Skin:    General: Skin is warm.     Capillary Refill: Capillary refill takes less than 2 seconds.  Neurological:     Mental Status: She is alert and oriented to person, place, and time.     Motor: No abnormal muscle tone.       ____________________________________________   LABS (all labs ordered are listed, but only abnormal results are displayed)  Labs Reviewed  URINALYSIS, COMPLETE (UACMP) WITH MICROSCOPIC - Abnormal; Notable for the following components:      Result Value   Color, Urine YELLOW (*)    APPearance CLEAR (*)    Nitrite POSITIVE (*)    All other  components within normal limits  LIPASE, BLOOD  COMPREHENSIVE METABOLIC PANEL  CBC  TYPE AND SCREEN    ____________________________________________  EKG: None ________________________________________  RADIOLOGY All imaging, including plain films, CT scans, and ultrasounds, independently reviewed by me, and interpretations confirmed via formal radiology reads.  ED MD interpretation:   CT A/P: As below - small volume air in vaginal vault, concerning for possible small fistula though no surrounding stranding, possible mild colitis, no obstruction, no free air  Official radiology report(s): CT ABDOMEN PELVIS W CONTRAST  Result Date: 07/27/2019 CLINICAL DATA:  Abdominal and pelvic pain question rectal vaginal fistula EXAM: CT ABDOMEN AND PELVIS WITH CONTRAST TECHNIQUE: Multidetector CT imaging of the abdomen and pelvis was performed using the standard protocol following bolus administration of intravenous contrast. CONTRAST:  172mL OMNIPAQUE IOHEXOL 300 MG/ML  SOLN COMPARISON:  Radiograph 01/04/2018, CT 12/08/2017 FINDINGS: Lower chest: Lung bases demonstrate no acute consolidation or effusion. Heart size within normal limits. Hepatobiliary: Status post cholecystectomy. Chronic intra and extrahepatic biliary dilatation Pancreas: Unremarkable. No pancreatic ductal dilatation or surrounding inflammatory changes. Spleen: Normal in size without focal abnormality. Adrenals/Urinary Tract: Adrenal glands appear normal. No hydronephrosis. Cortical thinning at the mid left kidney. Slightly thick-walled urinary bladder Stomach/Bowel: Stomach nonenlarged. No dilated small bowel. Negative appendix. Slightly thickened appearance of distal small bowel within the anterior pelvis near anastomotic suture. No convincing evidence for obstruction. Postsurgical changes at the rectosigmoid colon. Vascular/Lymphatic: Mild aortic atherosclerosis. No aneurysm. No significant adenopathy Reproductive: Status post hysterectomy.  No adnexal mass. Scant amounts of air within the vagina, nonspecific, intermittently seen on previous exams. Other: No pelvic effusion. No free air. Prominent midline low abdominal scarring. Adhesed appearance of the bowel to the anterior abdominal wall. Musculoskeletal: Degenerative changes. No acute or suspicious osseous abnormality IMPRESSION: 1. Small foci of gas within the vagina which is nonspecific and seen intermittently on previous exams. No gas or fluid containing fistulous track is identified in the pelvis, note however that enteral contrast has not yet reached the distal colon or rectum. 2. Postsurgical changes  of the small bowel and rectosigmoid colon. Mildly thickened appearance of small bowel near the anastomotic suture though no convincing evidence for an obstruction. Prominent anterior abdominal wall scarring. 3. Post cholecystectomy change. Chronic intra and extrahepatic biliary dilatation. Electronically Signed   By: Donavan Foil M.D.   On: 07/27/2019 22:41    ____________________________________________  PROCEDURES   Procedure(s) performed (including Critical Care):  Procedures  ____________________________________________  INITIAL IMPRESSION / MDM / [place] / ED COURSE  As part of my medical decision making, I reviewed the following data within the [facility] notes reviewed and incorporated, Old chart reviewed, Notes from prior ED visits, and [place] Controlled Substance [place] was evaluated in Emergency Department on 07/28/2019 for the symptoms described in the history of present illness. She was evaluated in the context of the global COVID-19 pandemic, which necessitated consideration that the patient might be at risk for infection with the SARS-CoV-2 virus that causes COVID-19. Institutional protocols and algorithms that pertain to the evaluation of patients at risk for COVID-19 are in a state of rapid change based on  information released by regulatory bodies including the CDC and federal and state organizations. These policies and algorithms were followed during the patient's care in the ED.  Some ED evaluations and interventions may be delayed as a result of limited staffing during the pandemic.*     Medical Decision Making:  59 yo F here with abdominal pain, reported diarrhea from vagina now resolved. History of recurrent EC fistulae. On exam, no stool or abnormal tissue noted on vaginal exam or DRE. WBC normal, pt is afebrile, well appearing and non-toxic. CT scan shows possible mild colitis, no obstruction. There is a small amount of air in vagina which could fit with small fistula. Discussed with Dr. Amalia Hailey of OBGYN. Given reassuring labs and vitals, recommends outpt f/u with [facility]. Will cover empirically with Rocephin/flagyl here then augmentin at home for broad anaerobic activity. Pt updated and in agreement. She will return with any worsening pain, fever, discharge, or other concerning symptoms.  ____________________________________________  FINAL CLINICAL IMPRESSION(S) / ED DIAGNOSES  Final diagnoses:  Colitis  Rectovaginal fistula     MEDICATIONS GIVEN DURING THIS VISIT:  Medications  iohexol (OMNIPAQUE) 9 MG/ML oral solution 500 mL (500 mLs Oral Contrast Given 07/27/19 2014)  sodium chloride flush (NS) 0.9 % injection 3 mL (3 mLs Intravenous Given 07/27/19 2018)  fentaNYL (SUBLIMAZE) injection 75 mcg (75 mcg Intravenous Given 07/27/19 2007)  sodium chloride 0.9 % bolus 1,000 mL (0 mLs Intravenous Stopped 07/27/19 2247)  ondansetron (ZOFRAN) injection 4 mg (4 mg Intravenous Given 07/27/19 2005)  HYDROmorphone (DILAUDID) injection 1 mg (1 mg Intravenous Given 07/27/19 2215)  iohexol (OMNIPAQUE) 300 MG/ML solution 100 mL (100 mLs Intravenous Contrast Given 07/27/19 2214)  cefTRIAXone (ROCEPHIN) 2 g in sodium chloride 0.9 % 100 mL IVPB (0 g Intravenous Stopped 07/28/19 0130)  metroNIDAZOLE (FLAGYL)  tablet 500 mg (500 mg Oral Given 07/28/19 0032)  oxyCODONE-acetaminophen (PERCOCET/ROXICET) 5-325 MG per tablet 1 tablet (1 tablet Oral Given 07/28/19 0032)     ED Discharge Orders         Ordered    nystatin (MYCOSTATIN/NYSTOP) powder  3 times daily,   Status:  Discontinued     07/28/19 0131    nystatin (MYCOSTATIN/NYSTOP) powder  3 times daily     07/28/19 0132    amoxicillin-clavulanate (AUGMENTIN) 875-125 MG tablet  2 times  daily     07/27/19 2324    ondansetron (ZOFRAN ODT) 4 MG disintegrating tablet  Every 8 hours PRN     07/27/19 2324    oxyCODONE-acetaminophen (PERCOCET) 5-325 MG tablet  Every 6 hours PRN     07/27/19 2324           Note:  This document was prepared using Dragon voice recognition software and may include unintentional dictation errors.   Duffy Bruce, MD 07/28/19 [phone]

## 2019-07-27 NOTE — ED Triage Notes (Signed)
Pt here with c/o "having gas come out of her privates." Hx of multiple abdominal surgeries, last night she began having "poop come out of her vagina." Thinks she has yeast infection under an abdominal fold. Is having black diarrhea, causing a lot of abdominal pain. Walked to triage with no issues. NAD.

## 2019-07-27 NOTE — ED Notes (Signed)
Pt son updated on her condition. Pt gave verbal permission to give update.

## 2019-07-27 NOTE — ED Notes (Signed)
Dr Isaacs at bedside 

## 2019-07-27 NOTE — ED Notes (Signed)
Pt states she has had black stool for a few days now, black diarrhea began last night.

## 2019-07-27 NOTE — Discharge Instructions (Addendum)
Call your [facility] or the numbers above to set up follow-up, ideally within the next 3-5 business days.  You likely will need surgery for this fistula. We have prescribed antibiotics and pain meds but it is VERY important to call your OB for further treatment.

## 2019-07-28 MED ORDER — AMOXICILLIN-POT CLAVULANATE 875-125 MG PO TABS: 1.0000 | ORAL_TABLET | Freq: Two times a day (BID) | ORAL | 0 refills | Status: AC

## 2019-07-28 MED ORDER — NYSTATIN 100000 UNIT/GM EX POWD: 1.0000 "application " | Freq: Three times a day (TID) | CUTANEOUS | 0 refills | Status: DC

## 2019-07-28 MED ORDER — NYSTATIN 100000 UNIT/GM EX POWD: 1.0000 "application " | Freq: Three times a day (TID) | CUTANEOUS | 0 refills | Status: AC

## 2019-07-28 MED ORDER — ONDANSETRON 4 MG PO TBDP: 4.0000 mg | ORAL_TABLET | Freq: Three times a day (TID) | ORAL | 0 refills | Status: DC | PRN

## 2019-07-28 MED ORDER — OXYCODONE-ACETAMINOPHEN 5-325 MG PO TABS: 1.0000 | ORAL_TABLET | Freq: Four times a day (QID) | ORAL | 0 refills | Status: DC | PRN

## 2019-09-11 DIAGNOSIS — N824 Other female intestinal-genital tract fistulae: Secondary | ICD-10-CM | POA: Insufficient documentation

## 2019-12-19 ENCOUNTER — Other Ambulatory Visit: Payer: Self-pay

## 2019-12-19 ENCOUNTER — Ambulatory Visit (INDEPENDENT_AMBULATORY_CARE_PROVIDER_SITE_OTHER): Payer: Medicaid Other

## 2019-12-19 ENCOUNTER — Encounter: Payer: Self-pay | Admitting: Podiatry

## 2019-12-19 ENCOUNTER — Other Ambulatory Visit: Payer: Self-pay | Admitting: Podiatry

## 2019-12-19 ENCOUNTER — Ambulatory Visit (INDEPENDENT_AMBULATORY_CARE_PROVIDER_SITE_OTHER): Payer: Medicaid Other | Admitting: Podiatry

## 2019-12-19 DIAGNOSIS — M7661 Achilles tendinitis, right leg: Secondary | ICD-10-CM | POA: Diagnosis not present

## 2019-12-19 DIAGNOSIS — B351 Tinea unguium: Secondary | ICD-10-CM | POA: Diagnosis not present

## 2019-12-19 DIAGNOSIS — S93601A Unspecified sprain of right foot, initial encounter: Secondary | ICD-10-CM

## 2019-12-19 DIAGNOSIS — M7671 Peroneal tendinitis, right leg: Secondary | ICD-10-CM

## 2019-12-19 DIAGNOSIS — M779 Enthesopathy, unspecified: Secondary | ICD-10-CM

## 2019-12-19 MED ORDER — MELOXICAM 15 MG PO TABS: 15.0000 mg | ORAL_TABLET | Freq: Every day | ORAL | 1 refills | Status: DC

## 2019-12-19 MED ORDER — METHYLPREDNISOLONE 4 MG PO TBPK: ORAL_TABLET | ORAL | 0 refills | Status: DC

## 2019-12-19 NOTE — Progress Notes (Signed)
   HPI: 59 y.o. female presenting today for follow-up evaluation regarding her right ankle sprain the patient sustained approximately 1 week ago.  Patient states that she stepped off of some stairs wrong and injured her foot.  She went to the urgent care and was diagnosed with a sprain.   Patient does have a history of first MTPJ arthrodesis to the right foot.  She also complains that her nails are so thick she cannot cut them.  They cause pain with shoe gear.  She presents for follow-up treatment evaluation  Past Medical History:  Diagnosis Date  . Diverticulitis   . Hypertension   . S/P ablation operation for arrhythmia    for WPW  . Wolf-Parkinson-White syndrome      Physical Exam: General: The patient is alert and oriented x3 in no acute distress.  Dermatology: Skin is warm, dry and supple bilateral lower extremities. Negative for open lesions or macerations.  Hyperkeratotic dystrophic discolored nails noted 1-5 bilateral  Vascular: Palpable pedal pulses bilaterally.  There is some moderate edema noted to the right foot and ankle capillary refill within normal limits.  Neurological: Epicritic and protective threshold grossly intact bilaterally.   Musculoskeletal Exam: Range of motion within normal limits to all pedal and ankle joints bilateral. Muscle strength 5/5 in all groups bilateral.  Pain on palpation along the peroneal tendon just distal to the lateral malleolus right foot There is also some palpable orthopedic screws overlying the first MTPJ just deep to the skin.  These appear to be nonsymptomatic at the moment  Radiographic Exam:  Normal osseous mineralization. Joint spaces preserved with exception of the first MTPJ with orthopedic hardware that appears to be intact.. No fracture/dislocation/boney destruction.    Assessment: 1.  Right foot sprain, initial encounter 2.  Peroneal tendinitis right secondary to injury 3.  Dystrophic nails secondary to onychomycosis right  foot   Plan of Care:  1. Patient evaluated. X-Rays reviewed.  2.  Injection of 1 cc Kenalog 40 injected along the peroneal tendon sheath right lower extremity 3.  Compression anklet dispensed 4.  Prescription for Medrol Dosepak 5.  Prescription for meloxicam.  Begin taking after completion of the Dosepak 6.  Continue wearing good supportive tennis shoes 7.  Mechanical debridement of nails 1-5 right foot was performed using a nail nipper without incident or bleeding  8.  Return to clinic in 3 months for routine foot care      Edrick Kins, DPM [facility]  Dr. Edrick Kins, DPM    [address],  [address]                Office [phone]  Fax [phone]

## 2020-01-12 ENCOUNTER — Ambulatory Visit: Payer: Medicaid Other | Admitting: Podiatry

## 2020-02-06 ENCOUNTER — Ambulatory Visit (INDEPENDENT_AMBULATORY_CARE_PROVIDER_SITE_OTHER): Payer: Medicaid Other

## 2020-02-06 ENCOUNTER — Other Ambulatory Visit: Payer: Self-pay

## 2020-02-06 ENCOUNTER — Other Ambulatory Visit: Payer: Self-pay | Admitting: Podiatry

## 2020-02-06 ENCOUNTER — Ambulatory Visit (INDEPENDENT_AMBULATORY_CARE_PROVIDER_SITE_OTHER): Payer: Medicaid Other | Admitting: Podiatry

## 2020-02-06 ENCOUNTER — Encounter: Payer: Self-pay | Admitting: Podiatry

## 2020-02-06 DIAGNOSIS — T8484XA Pain due to internal orthopedic prosthetic devices, implants and grafts, initial encounter: Secondary | ICD-10-CM

## 2020-02-06 DIAGNOSIS — M216X1 Other acquired deformities of right foot: Secondary | ICD-10-CM | POA: Diagnosis not present

## 2020-02-06 DIAGNOSIS — S99921A Unspecified injury of right foot, initial encounter: Secondary | ICD-10-CM

## 2020-02-06 DIAGNOSIS — T85848A Pain due to other internal prosthetic devices, implants and grafts, initial encounter: Secondary | ICD-10-CM

## 2020-02-06 DIAGNOSIS — M7751 Other enthesopathy of right foot: Secondary | ICD-10-CM

## 2020-02-06 MED ORDER — OXYCODONE-ACETAMINOPHEN 10-325 MG PO TABS: 1.0000 | ORAL_TABLET | Freq: Three times a day (TID) | ORAL | 0 refills | Status: AC | PRN

## 2020-02-06 MED ORDER — METHYLPREDNISOLONE 4 MG PO TBPK: ORAL_TABLET | ORAL | 0 refills | Status: DC

## 2020-02-06 NOTE — Progress Notes (Signed)
Subjective:  Patient ID: Alison Walker, female    DOB: January 21, 1961,  MRN: [id number]  Chief Complaint  Patient presents with  . Toe Injury    "I hit my toe on concrete stairs 2 days ago, now the pain is severe, I can't sleep at night"    59 y.o. female presents with the above complaint.  Patient presents with a complaint of right first metatarsophalangeal joint painful orthopedic hardware.  Patient had a fusion done in the past that seems to be causing her some pain from the hardware.  There is mild loosening of the interfrag screw noted.  However she states that it started aggravating over the last couple of days has progressively gotten worse.  She is known to Dr. Amalia Hailey and who is actively being treated by him.  She wants to know if she could get an injection in the area to help with the pain.  She denies any other acute complaints.   Review of Systems: Negative except as noted in the HPI. Denies N/V/F/Ch.  Past Medical History:  Diagnosis Date  . Diverticulitis   . Hypertension   . S/P ablation operation for arrhythmia    for WPW  . Wolf-Parkinson-White syndrome     Current Outpatient Medications:  .  ALPRAZolam (XANAX) 0.5 MG tablet, Take 0.5 mg by mouth at bedtime as needed for anxiety., Disp: , Rfl:  .  aspirin EC 81 MG tablet, Take 81 mg by mouth., Disp: , Rfl:  .  atenolol (TENORMIN) 50 MG tablet, Take 50 mg by mouth., Disp: , Rfl:  .  atorvastatin (LIPITOR) 40 MG tablet, Take 40 mg by mouth daily., Disp: , Rfl:  .  desipramine (NORPRAMIN) 50 MG tablet, Take 50 mg by mouth., Disp: , Rfl:  .  dicyclomine (BENTYL) 20 MG tablet, Take 20 mg by mouth every 6 (six) hours., Disp: , Rfl:  .  escitalopram (LEXAPRO) 20 MG tablet, Take 20 mg by mouth daily., Disp: , Rfl:  .  lisinopril (PRINIVIL,ZESTRIL) 10 MG tablet, Take 10 mg by mouth., Disp: , Rfl:  .  meloxicam (MOBIC) 15 MG tablet, Take 1 tablet (15 mg total) by mouth daily., Disp: 30 tablet, Rfl: 1 .  methocarbamol (ROBAXIN) 750  MG tablet, Take 1 tablet (750 mg total) by mouth 4 (four) times daily., Disp: 40 tablet, Rfl: 0 .  methylPREDNISolone (MEDROL DOSEPAK) 4 MG TBPK tablet, 6 day dose pack - take as directed, Disp: 21 tablet, Rfl: 0 .  methylPREDNISolone (MEDROL DOSEPAK) 4 MG TBPK tablet, Use as directed, Disp: 1 each, Rfl: 0 .  omeprazole (PRILOSEC) 40 MG capsule, Take 40 mg by mouth daily., Disp: , Rfl:  .  ondansetron (ZOFRAN ODT) 4 MG disintegrating tablet, Take 1 tablet (4 mg total) by mouth every 8 (eight) hours as needed for nausea or vomiting., Disp: 15 tablet, Rfl: 0 .  oxyCODONE-acetaminophen (PERCOCET) 10-325 MG tablet, Take 1 tablet by mouth every 8 (eight) hours as needed for up to 5 days for pain., Disp: 15 tablet, Rfl: 0 .  oxyCODONE-acetaminophen (PERCOCET) 5-325 MG tablet, Take 1-2 tablets by mouth every 6 (six) hours as needed for moderate pain or severe pain., Disp: 15 tablet, Rfl: 0 .  pantoprazole (PROTONIX) 40 MG tablet, Take 1 tablet (40 mg total) by mouth daily., Disp: 30 tablet, Rfl: 1 .  predniSONE (STERAPRED UNI-PAK 21 TAB) 10 MG (21) TBPK tablet, Take 6 tablets on the first day and decrease by 1 tablet each day until finished., Disp:  21 tablet, Rfl: 0 .  Sennosides (SENNA) 8.6 MG CAPS, Take by mouth., Disp: , Rfl:  .  sucralfate (CARAFATE) 1 g tablet, Take 1 tablet (1 g total) by mouth 4 (four) times daily., Disp: 120 tablet, Rfl: 1 .  terbinafine (LAMISIL) 250 MG tablet, Take 250 mg by mouth daily., Disp: , Rfl:   Social History   Tobacco Use  Smoking Status Current Some Day Smoker  . Packs/day: 0.50  . Types: Cigarettes  Smokeless Tobacco Never Used    Allergies  Allergen Reactions  . Ciprofloxacin Rash  . Morphine Rash   Objective:  There were no vitals filed for this visit. There is no height or weight on file to calculate BMI. Constitutional Well developed. Well nourished.  Vascular Dorsalis pedis pulses palpable bilaterally. Posterior tibial pulses palpable  bilaterally. Capillary refill normal to all digits.  No cyanosis or clubbing noted. Pedal hair growth normal.  Neurologic Normal speech. Oriented to person, place, and time. Epicritic sensation to light touch grossly present bilaterally.  Dermatologic Nails well groomed and normal in appearance. No open wounds. No skin lesions.  Orthopedic:  Pain on palpation to the right first metatarsophalangeal joint at the site of previous incision as well as hardware.  No range of motion available at the MPJ joint secondary to fusion.  Hardware is palpated especially the proximal screw.  No ulcerations noted.   Radiographs: 3 views of skeletally mature the right foot: Hardware is intact no signs of breaking noted.  Mild loosening of the proximal interfrag screw noted.  Does not appear to have a complete fusion of the MPJ joint but may partially be fused. Assessment:   1. Injury of toe on right foot, initial encounter   2. Capsulitis of metatarsophalangeal (MTP) joint of right foot   3. Pain from implanted hardware, initial encounter    Plan:  Patient was evaluated and treated and all questions answered.  Right painful orthopedic hardware the first metatarsophalangeal joint -I explained to patient the etiology of her pain and various treatment options were discussed.  I believe patient will eventually benefit for removal of the orthopedic hardware.  Given that she has a lot of acute inflammatory component associated pain I believe she will benefit from a steroid injection for now.  Patient agrees with the plan would like to proceed with a steroid injection. -A steroid injection was performed at right first metatarsophalangeal joint using 1% plain Lidocaine and 10 mg of Kenalog. This was well tolerated. -Medrol Dosepak was dispensed -We will return to see Dr. Amalia Hailey for removal of painful orthopedic hardware   No follow-ups on file.

## 2020-02-07 DIAGNOSIS — K56699 Other intestinal obstruction unspecified as to partial versus complete obstruction: Secondary | ICD-10-CM | POA: Insufficient documentation

## 2020-02-08 DIAGNOSIS — J449 Chronic obstructive pulmonary disease, unspecified: Secondary | ICD-10-CM | POA: Insufficient documentation

## 2020-02-09 ENCOUNTER — Other Ambulatory Visit: Payer: Self-pay | Admitting: Podiatry

## 2020-02-13 ENCOUNTER — Ambulatory Visit: Payer: Medicaid Other | Admitting: Podiatry

## 2020-02-27 ENCOUNTER — Emergency Department: Payer: Medicaid Other

## 2020-02-27 ENCOUNTER — Emergency Department
Admission: EM | Admit: 2020-02-27 | Discharge: 2020-02-28 | Disposition: A | Discharge status: Other Acute Inpatient Hospital | Payer: Medicaid Other | Attending: Emergency Medicine | Admitting: Emergency Medicine

## 2020-02-27 ENCOUNTER — Other Ambulatory Visit: Payer: Self-pay

## 2020-02-27 DIAGNOSIS — R112 Nausea with vomiting, unspecified: Secondary | ICD-10-CM | POA: Insufficient documentation

## 2020-02-27 DIAGNOSIS — K651 Peritoneal abscess: Secondary | ICD-10-CM | POA: Diagnosis not present

## 2020-02-27 DIAGNOSIS — Z7982 Long term (current) use of aspirin: Secondary | ICD-10-CM | POA: Insufficient documentation

## 2020-02-27 DIAGNOSIS — Z79899 Other long term (current) drug therapy: Secondary | ICD-10-CM | POA: Insufficient documentation

## 2020-02-27 DIAGNOSIS — I456 Pre-excitation syndrome: Secondary | ICD-10-CM | POA: Insufficient documentation

## 2020-02-27 DIAGNOSIS — Z20822 Contact with and (suspected) exposure to covid-19: Secondary | ICD-10-CM | POA: Diagnosis not present

## 2020-02-27 DIAGNOSIS — I10 Essential (primary) hypertension: Secondary | ICD-10-CM | POA: Insufficient documentation

## 2020-02-27 DIAGNOSIS — F17210 Nicotine dependence, cigarettes, uncomplicated: Secondary | ICD-10-CM | POA: Insufficient documentation

## 2020-02-27 DIAGNOSIS — R1030 Lower abdominal pain, unspecified: Secondary | ICD-10-CM | POA: Diagnosis present

## 2020-02-27 DIAGNOSIS — I252 Old myocardial infarction: Secondary | ICD-10-CM | POA: Insufficient documentation

## 2020-02-27 LAB — URINALYSIS, COMPLETE (UACMP) WITH MICROSCOPIC
Bilirubin Urine: NEGATIVE
Glucose, UA: NEGATIVE mg/dL
Ketones, ur: NEGATIVE mg/dL
Nitrite: NEGATIVE
Protein, ur: >=300 mg/dL — AB
Specific Gravity, Urine: 1.024 (ref 1.005–1.030)
pH: 6 (ref 5.0–8.0)

## 2020-02-27 LAB — COMPREHENSIVE METABOLIC PANEL
ALT: 24 U/L (ref 0–44)
AST: 24 U/L (ref 15–41)
Albumin: 2.9 g/dL — ABNORMAL LOW (ref 3.5–5.0)
Alkaline Phosphatase: 79 U/L (ref 38–126)
Anion gap: 16 — ABNORMAL HIGH (ref 5–15)
BUN: 14 mg/dL (ref 6–20)
CO2: 30 mmol/L (ref 22–32)
Calcium: 8.3 mg/dL — ABNORMAL LOW (ref 8.9–10.3)
Chloride: 94 mmol/L — ABNORMAL LOW (ref 98–111)
Creatinine, Ser: 0.6 mg/dL (ref 0.44–1.00)
GFR calc Af Amer: >60 mL/min (ref >60)
GFR calc non Af Amer: >60 mL/min (ref >60)
Glucose, Bld: 174 mg/dL — ABNORMAL HIGH (ref 70–99)
Potassium: 2.7 mmol/L — CL (ref 3.5–5.1)
Sodium: 140 mmol/L (ref 135–145)
Total Bilirubin: 0.7 mg/dL (ref 0.3–1.2)
Total Protein: 7.5 g/dL (ref 6.5–8.1)

## 2020-02-27 LAB — CBC
HCT: 32.1 % — ABNORMAL LOW (ref 36.0–46.0)
Hemoglobin: 10.8 g/dL — ABNORMAL LOW (ref 12.0–15.0)
MCH: 31.2 pg (ref 26.0–34.0)
MCHC: 33.6 g/dL (ref 30.0–36.0)
MCV: 92.8 fL (ref 80.0–100.0)
Platelets: 1170 10*3/uL (ref 150–400)
RBC: 3.46 MIL/uL — ABNORMAL LOW (ref 3.87–5.11)
RDW: 14.1 % (ref 11.5–15.5)
WBC: 15.3 10*3/uL — ABNORMAL HIGH (ref 4.0–10.5)
nRBC: 0 % (ref 0.0–0.2)

## 2020-02-27 LAB — LIPASE, BLOOD: Lipase: 17 U/L (ref 11–51)

## 2020-02-27 LAB — PATHOLOGIST SMEAR REVIEW

## 2020-02-27 MED ORDER — SODIUM CHLORIDE 0.9 % IV BOLUS
1000.0000 mL | Freq: Once | INTRAVENOUS | Status: AC
  Administered 2020-02-27: 1000 mL via INTRAVENOUS

## 2020-02-27 MED ORDER — IOHEXOL 300 MG/ML  SOLN
100.0000 mL | Freq: Once | INTRAMUSCULAR | Status: AC | PRN
  Administered 2020-02-27: 100 mL via INTRAVENOUS

## 2020-02-27 MED ORDER — ONDANSETRON HCL 4 MG/2ML IJ SOLN
4.0000 mg | Freq: Once | INTRAMUSCULAR | Status: AC
  Administered 2020-02-27: 4 mg via INTRAVENOUS
  Filled 2020-02-27: qty 2

## 2020-02-27 MED ORDER — MORPHINE SULFATE (PF) 4 MG/ML IV SOLN: 4.0000 mg | Freq: Once | INTRAVENOUS | Status: AC

## 2020-02-27 MED ORDER — POTASSIUM CHLORIDE 10 MEQ/100ML IV SOLN
10.0000 meq | Freq: Once | INTRAVENOUS | Status: AC
  Administered 2020-02-27: 10 meq via INTRAVENOUS
  Filled 2020-02-27: qty 100

## 2020-02-27 MED ORDER — MORPHINE SULFATE (PF) 4 MG/ML IV SOLN
INTRAVENOUS | Status: AC
  Administered 2020-02-27: 4 mg
  Filled 2020-02-27: qty 1

## 2020-02-27 NOTE — ED Triage Notes (Signed)
Pt states she has been vomiting and diarrhea since yesterday. Has a foley in place, states she had surgery on august 4th. Has JP drawn to site still.

## 2020-02-27 NOTE — ED Notes (Signed)
Platelets [id number] per lab.

## 2020-02-27 NOTE — ED Triage Notes (Signed)
First nurse note- surgery last week to fix hole in bladder. Arrived EMS.  Has foley in, and drain from surgery per EMS.  Called for cloudy urine.

## 2020-02-27 NOTE — ED Provider Notes (Signed)
[facility] Emergency Department Provider Note  Time seen: 10:08 PM  I have reviewed the triage vital signs and the nursing notes.   HISTORY  Chief Complaint Emesis and Abdominal Pain   HPI Alison Walker is a 59 y.o. female with a past medical history of diverticulitis, hypertension, recent colovesicular fistula surgery on 02/14/2020 presents to the emergency department for lower abdominal pain nausea vomiting.  According to the patient for the past several days she has been experiencing increasing lower abdominal discomfort now since yesterday significant nausea vomiting and very watery stool.  Patient states she is also noted her urine is cloudy with what appears to be debris's in her Foley catheter.  Patient has a JP drain that is still draining.  Patient states low-grade fever at home.  Past Medical History:  Diagnosis Date  . Diverticulitis   . Hypertension   . S/P ablation operation for arrhythmia    for WPW  . Wolf-Parkinson-White syndrome     Patient Active Problem List   Diagnosis Date Noted  . Cellulitis 03/17/2018  . WPW (Wolff-Parkinson-White syndrome) 03/17/2018  . Acquired hammertoe of right foot 03/19/2017  . Hallux rigidus of right foot 03/19/2017  . Ventral hernia without obstruction or gangrene 10/15/2014  . Anxiety state 04/07/2013  . Chronic pain syndrome 04/07/2013  . Heart disease 04/07/2013  . Neuropathic pain 04/07/2013  . Old myocardial infarction 04/07/2013  . Sialoadenitis 04/07/2013  . Tobacco use disorder 04/07/2013  . Intestinal obstruction ([place]) 12/18/2010    Past Surgical History:  Procedure Laterality Date  . ABDOMINAL SURGERY    . CHOLECYSTECTOMY    . COLONOSCOPY    . COLOSTOMY    . COLOSTOMY REVISION      Prior to Admission medications   Medication Sig Start Date End Date Taking? Authorizing Provider  ALPRAZolam Duanne Moron) 0.5 MG tablet Take 0.5 mg by mouth at bedtime as needed for anxiety.    [provider]  aspirin EC 81 MG tablet Take 81 mg by mouth.    [provider]  atenolol (TENORMIN) 50 MG tablet Take 50 mg by mouth. 10/26/13   [provider]  atorvastatin (LIPITOR) 40 MG tablet Take 40 mg by mouth daily.    [provider]  desipramine (NORPRAMIN) 50 MG tablet Take 50 mg by mouth. 09/14/13   [provider]  dicyclomine (BENTYL) 20 MG tablet Take 20 mg by mouth every 6 (six) hours.    [provider]  escitalopram (LEXAPRO) 20 MG tablet Take 20 mg by mouth daily.    [provider]  lisinopril (PRINIVIL,ZESTRIL) 10 MG tablet Take 10 mg by mouth. 10/26/13   [provider]  meloxicam (MOBIC) 15 MG tablet Take 1 tablet (15 mg total) by mouth daily. 12/19/19   Edrick Kins, DPM  methocarbamol (ROBAXIN) 750 MG tablet Take 1 tablet (750 mg total) by mouth 4 (four) times daily. 02/25/16   Beers, Pierce Crane, PA-C  methylPREDNISolone (MEDROL DOSEPAK) 4 MG TBPK tablet 6 day dose pack - take as directed 12/19/19   Edrick Kins, DPM  methylPREDNISolone (MEDROL DOSEPAK) 4 MG TBPK tablet Use as directed 02/06/20   Felipa Furnace, DPM  omeprazole (PRILOSEC) 40 MG capsule Take 40 mg by mouth daily.    [provider]  ondansetron (ZOFRAN ODT) 4 MG disintegrating tablet Take 1 tablet (4 mg total) by mouth every 8 (eight) hours as needed for nausea or vomiting. 07/28/19   Marjean Donna  N, MD  oxyCODONE-acetaminophen (PERCOCET) 5-325 MG tablet Take 1-2 tablets by mouth every 6 (six) hours as needed for moderate pain or severe pain. 07/28/19 07/27/20  Gregor Hams, MD  pantoprazole (PROTONIX) 40 MG tablet Take 1 tablet (40 mg total) by mouth daily. 01/04/18 01/04/19  Earleen Newport, MD  predniSONE (STERAPRED UNI-PAK 21 TAB) 10 MG (21) TBPK tablet Take 6 tablets on the first day and decrease by 1 tablet each day until finished. 11/25/18   Triplett, Cari B, FNP  Sennosides (SENNA) 8.6 MG CAPS Take by mouth. 10/26/13   [provider]  sucralfate (CARAFATE) 1 g tablet Take 1 tablet (1 g total) by mouth 4 (four) times daily. 01/04/18 01/04/19  Earleen Newport, MD  terbinafine (LAMISIL) 250 MG tablet Take 250 mg by mouth daily.    [provider]    Allergies  Allergen Reactions  . Ciprofloxacin Rash  . Morphine Rash    Family History  Problem Relation Age of Onset  . Cancer Mother   . Cancer Other     Social History Social History   Tobacco Use  . Smoking status: Current Some Day Smoker    Packs/day: 0.50    Types: Cigarettes  . Smokeless tobacco: Never Used  Substance Use Topics  . Alcohol use: No    Alcohol/week: 0.0 standard drinks  . Drug use: No    Review of Systems Constitutional: Low-grade fever per patient. Cardiovascular: Negative for chest pain. Respiratory: Negative for shortness of breath. Gastrointestinal: Positive for moderate dull lower abdominal discomfort.  Positive for nausea vomiting and diarrhea. Genitourinary: Negative for urinary compaints Musculoskeletal: Negative for musculoskeletal complaints Neurological: Negative for headache All other ROS negative  ____________________________________________   PHYSICAL EXAM:  VITAL SIGNS: ED Triage Vitals  Enc Vitals Group     BP 02/27/20 1319 (!) 145/82     Pulse Rate 02/27/20 1319 98     Resp 02/27/20 1319 18     Temp 02/27/20 1319 99.1 F (37.3 C)     Temp Source 02/27/20 1319 Oral     SpO2 02/27/20 1319 99 %     Weight 02/27/20 1319 170 lb (77.1 kg)     Height 02/27/20 1319 5\' 2"  (1.575 m)     Head Circumference --      Peak Flow --      Pain Score 02/27/20 1321 8     Pain Loc --      Pain Edu? --      Excl. in [place]? --     Constitutional: Alert and oriented. Well appearing and in no distress. Eyes: Normal exam ENT      Head: Normocephalic and atraumatic.      Mouth/Throat: Mucous membranes are moist. Cardiovascular: Normal rate, regular rhythm Respiratory: Normal respiratory effort  without tachypnea nor retractions. Breath sounds are clear Gastrointestinal: Soft, moderate lower abdominal tenderness palpation across the entire lower abdomen.  No rebound guarding or distention.  No significant upper abdominal tenderness.  Patient has a JP drain in place in the right lower quadrant.  Foley catheter in place. Musculoskeletal: Nontender with normal range of motion in all extremities.  Neurologic:  Normal speech and language. No gross focal neurologic deficits Skin:  Skin is warm, dry and intact.  Psychiatric: Mood and affect are normal.   ____________________________________________    EKG  EKG viewed and interpreted by myself shows a sinus rhythm at 96 bpm with a narrow QRS, normal axis, normal intervals, no  concerning ST changes.  ____________________________________________    [id number]  CT pending  ____________________________________________   INITIAL IMPRESSION / ASSESSMENT AND PLAN / ED COURSE  Pertinent labs & imaging results that were available during my care of the patient were reviewed by me and considered in my medical decision making (see chart for details).   Patient presents emergency department for lower abdominal pain nausea vomiting.  Patient is actively vomiting what appears to be bilious vomitus in the emergency department.  Moderate lower abdominal tenderness palpation.  Differential would include infectious etiologies such as postsurgical abscess, UTI, diverticulitis, would also include small bowel obstruction given her bilious vomitus.  Patient's lab work shows significant thrombocytosis with a moderate leukocytosis.  Patient is also hypokalemic.  We will treat with IV fluids, replete potassium with IV potassium.  We will obtain a CT scan abdomen/pelvis to further evaluate.  We will treat with pain and nausea medication while awaiting CT imaging.  CT scan is pending at this time.  Patient care signed out to oncoming physician.  Alison Walker  was evaluated in Emergency Department on 02/27/2020 for the symptoms described in the history of present illness. She was evaluated in the context of the global COVID-19 pandemic, which necessitated consideration that the patient might be at risk for infection with the SARS-CoV-2 virus that causes COVID-19. Institutional protocols and algorithms that pertain to the evaluation of patients at risk for COVID-19 are in a state of rapid change based on information released by regulatory bodies including the CDC and federal and state organizations. These policies and algorithms were followed during the patient's care in the ED.  ____________________________________________   FINAL CLINICAL IMPRESSION(S) / ED DIAGNOSES  Lower abdominal pain Nausea vomiting   Harvest Dark, MD 02/27/20 2253

## 2020-02-28 LAB — PROTIME-INR
INR: 1.1 (ref 0.8–1.2)
Prothrombin Time: 13.7 seconds (ref 11.4–15.2)

## 2020-02-28 LAB — APTT: aPTT: 38 seconds — ABNORMAL HIGH (ref 24–36)

## 2020-02-28 LAB — SARS CORONAVIRUS 2 BY RT PCR (HOSPITAL ORDER, PERFORMED IN ~~LOC~~ HOSPITAL LAB): SARS Coronavirus 2: NEGATIVE

## 2020-02-28 MED ORDER — SODIUM CHLORIDE 0.9 % IV SOLN: INTRAVENOUS | Status: DC

## 2020-02-28 MED ORDER — MORPHINE SULFATE (PF) 4 MG/ML IV SOLN
4.0000 mg | Freq: Once | INTRAVENOUS | Status: AC
  Administered 2020-02-28: 4 mg via INTRAVENOUS
  Filled 2020-02-28: qty 1

## 2020-02-28 MED ORDER — PIPERACILLIN-TAZOBACTAM 3.375 G IVPB 30 MIN
3.3750 g | Freq: Once | INTRAVENOUS | Status: AC
  Administered 2020-02-28: 3.375 g via INTRAVENOUS
  Filled 2020-02-28: qty 50

## 2020-02-28 MED ORDER — VANCOMYCIN HCL IN DEXTROSE 1-5 GM/200ML-% IV SOLN
1000.0000 mg | Freq: Once | INTRAVENOUS | Status: AC
  Administered 2020-02-28: 1000 mg via INTRAVENOUS
  Filled 2020-02-28: qty 200

## 2020-02-28 MED ORDER — ONDANSETRON HCL 4 MG/2ML IJ SOLN
4.0000 mg | Freq: Once | INTRAMUSCULAR | Status: AC
  Administered 2020-02-28: 4 mg via INTRAVENOUS
  Filled 2020-02-28: qty 2

## 2020-02-28 MED ORDER — FENTANYL CITRATE (PF) 100 MCG/2ML IJ SOLN: 50.0000 ug | Freq: Once | INTRAMUSCULAR | Status: AC

## 2020-02-28 MED ORDER — FENTANYL CITRATE (PF) 100 MCG/2ML IJ SOLN
50.0000 ug | Freq: Once | INTRAMUSCULAR | Status: AC
  Administered 2020-02-28: 50 ug via INTRAVENOUS
  Filled 2020-02-28: qty 2

## 2020-02-28 MED ORDER — FENTANYL CITRATE (PF) 100 MCG/2ML IJ SOLN
INTRAMUSCULAR | Status: AC
  Administered 2020-02-28: 50 ug via INTRAVENOUS
  Filled 2020-02-28: qty 2

## 2020-02-28 MED ORDER — SODIUM CHLORIDE 0.9 % IV BOLUS
1000.0000 mL | Freq: Once | INTRAVENOUS | Status: AC
  Administered 2020-02-28: 1000 mL via INTRAVENOUS

## 2020-02-28 MED ORDER — POTASSIUM CHLORIDE 10 MEQ/100ML IV SOLN: 10.0000 meq | INTRAVENOUS | Status: DC

## 2020-02-28 NOTE — ED Provider Notes (Addendum)
I assumed care of the patient from Dr. Kerman Passey at 11:00 PM.  CT scan of the abdomen pelvis revealed large 10.9 by 5.6 cm fluid collection in the anterior aspect of the mid left abdomen extending down to the left lower quadrant approximately 13.6 cm in length consistent with intra-abdominal abscess.  After reviewing the CAT scan patient was given IV vancomycin 1 g and Zosyn 3.375 mg IV.  Patient continued to have 8 out of 10 pain and as such fentanyl 50 mcg was administered with some pain improvement.  [facility] transfer center was notified for transfer given the fact that the patient surgery was performed at [facility] on 02/14/2020.  I was notified by the transfer center that they are at "full capacity" and as such they were unable to take any patients at present.  Patient was also discussed with Dr. Sheryn Bison who performed the patient's surgery on 02/14/2020 who reviewed the patient's imaging and recommended that a percutaneous drain be placed.  Dr. Sheryn Bison did confirm that there were no available beds at [facility].  Patient was subsequently discussed with Dr. Dahlia Byes general surgeon on-call with plan for admission for further management.   .Critical Care Performed by: Gregor Hams, MD Authorized by: Gregor Hams, MD   Critical care provider statement:    Critical care time (minutes):  30   Critical care time was exclusive of:  Separately billable procedures and treating other patients (Intra-abdominal abscess)   Critical care was necessary to treat or prevent imminent or life-threatening deterioration of the following conditions:  Sepsis   Critical care was time spent personally by me on the following activities:  Development of treatment plan with patient or surrogate, discussions with consultants, evaluation of patient's response to treatment, examination of patient, obtaining history from patient or surrogate, ordering and performing treatments and interventions, ordering and review of laboratory studies,  ordering and review of radiographic studies, pulse oximetry, re-evaluation of patient's condition and review of old charts   After the initial conversation with Dr. Dahlia Byes he called back to let me know that he was uncomfortable caring for the patient here at [facility].  At this point I instructed Dr. Dahlia Byes to have a conversation with Dr. Sheryn Bison.  Following the conversation between Dr. Sheryn Bison and Dr. Dahlia Byes I was informed by Dr. Dahlia Byes that the patient was accepted ED to ED transfer by Dr. Sheryn Bison.  The [facility] transfer center notified the secondary staff that they had no available beds at present in the emergency department.  They are requesting that we continue care for the patient at [facility] and they will notify us when a bed does become available.     Gregor Hams, MD 02/28/20 0102    Gregor Hams, MD 02/28/20 [phone]

## 2020-02-28 NOTE — ED Notes (Signed)
Rounded on pt at this time, pt requesting pain medication. Provider made aware.

## 2020-02-28 NOTE — ED Notes (Signed)
Pt had large bile projectile vomiting.  MD notified.  Medication orders placed.

## 2020-02-28 NOTE — Progress Notes (Signed)
Extensive chart review performed. Case D/w Dr. Owens Shark from our ER and Dr.  Sheryn Bison from Colorectal surgery at [facility]. She had redo colorectal anastomosis and SB resection x 3. Very hostile abdomen and "prohibitive adhesions". Presents with abd pain, vomiting, low grade fever. WBC 15k, thrombocytosis and severe hypokalemia. I have d/w Dr. Sheryn Bison and I personally feel that she is better serve at [facility] by her original operating surgeon. [facility] is at capacity but they have accepted her for transfer.

## 2020-02-29 DIAGNOSIS — T8131XA Disruption of external operation (surgical) wound, not elsewhere classified, initial encounter: Secondary | ICD-10-CM | POA: Insufficient documentation

## 2020-02-29 DIAGNOSIS — R188 Other ascites: Secondary | ICD-10-CM | POA: Insufficient documentation

## 2020-02-29 LAB — URINE CULTURE: Culture: >=100000 — AB

## 2020-03-25 ENCOUNTER — Ambulatory Visit: Payer: Medicaid Other | Admitting: Podiatry

## 2020-03-26 DIAGNOSIS — M79676 Pain in unspecified toe(s): Secondary | ICD-10-CM

## 2020-03-28 ENCOUNTER — Ambulatory Visit: Payer: Medicaid Other | Admitting: Podiatry

## 2020-04-17 ENCOUNTER — Ambulatory Visit: Payer: Medicaid Other | Admitting: Podiatry

## 2020-04-25 ENCOUNTER — Encounter: Payer: Medicaid Other | Admitting: Podiatry

## 2020-05-28 ENCOUNTER — Ambulatory Visit: Payer: Medicaid Other | Admitting: Podiatry

## 2020-06-10 ENCOUNTER — Other Ambulatory Visit: Payer: Self-pay

## 2020-06-10 ENCOUNTER — Ambulatory Visit (INDEPENDENT_AMBULATORY_CARE_PROVIDER_SITE_OTHER): Payer: Medicaid Other | Admitting: Podiatry

## 2020-06-10 ENCOUNTER — Encounter: Payer: Self-pay | Admitting: Podiatry

## 2020-06-10 ENCOUNTER — Ambulatory Visit (INDEPENDENT_AMBULATORY_CARE_PROVIDER_SITE_OTHER): Payer: Medicaid Other

## 2020-06-10 DIAGNOSIS — S99921A Unspecified injury of right foot, initial encounter: Secondary | ICD-10-CM | POA: Diagnosis not present

## 2020-06-10 DIAGNOSIS — S9031XA Contusion of right foot, initial encounter: Secondary | ICD-10-CM

## 2020-06-10 DIAGNOSIS — B353 Tinea pedis: Secondary | ICD-10-CM | POA: Diagnosis not present

## 2020-06-10 DIAGNOSIS — G5791 Unspecified mononeuropathy of right lower limb: Secondary | ICD-10-CM | POA: Diagnosis not present

## 2020-06-10 MED ORDER — METHYLPREDNISOLONE 4 MG PO TBPK: ORAL_TABLET | ORAL | 0 refills | Status: DC

## 2020-06-10 MED ORDER — KETOCONAZOLE 2 % EX CREA: 1.0000 "application " | TOPICAL_CREAM | Freq: Every day | CUTANEOUS | 2 refills | Status: DC

## 2020-06-10 MED ORDER — LIDOCAINE-PRILOCAINE 2.5-2.5 % EX CREA: 1.0000 "application " | TOPICAL_CREAM | CUTANEOUS | 0 refills | Status: DC | PRN

## 2020-06-10 MED ORDER — TRAMADOL HCL 50 MG PO TABS: 50.0000 mg | ORAL_TABLET | Freq: Three times a day (TID) | ORAL | 0 refills | Status: AC | PRN

## 2020-06-11 ENCOUNTER — Encounter: Payer: Self-pay | Admitting: Podiatry

## 2020-06-11 NOTE — Progress Notes (Signed)
  Subjective:  Patient ID: Alison Walker, female    DOB: 1961/01/06,  MRN: [id number]  Chief Complaint  Patient presents with  . Foot Pain    "I fell on Thanksgiving day and I think my foot went backwards.  Now my toes stay swollen and my right ankle swells and it hurts to walk.  I have screws in my big toe and I don't know if they are coming out now"  . Callouses    she also c/o painful callous lesions bilat forefeet    59 y.o. female presents with the above complaint. History confirmed with patient.   Objective:  Physical Exam: warm, good capillary refill, no trophic changes or ulcerative lesions, normal DP and PT pulses and normal sensory exam.  Tinea pedis severe noted bilaterally with dry scaling erythematous rash   Right Foot: Well-healed surgical scars over the first metatarsophalangeal joint, the joint is surgically fused.  The hardware is prominent and palpable.  She has tenderness in this area with neuritic symptoms.  No instability, ecchymosis or fracture right foot or ankle   Radiographs: X-ray of the right foot: Hardware appears to be in unchanged alignment since last radiographs in July 2021, no evidence of fracture currently Assessment:   1. Right foot injury, initial encounter   2. Contusion of right foot, initial encounter   3. Neuritis of right foot      Plan:  Patient was evaluated and treated and all questions answered.  I discussed with her that she has a contusion of the right foot and likely has a neuropraxia of the dorsal cutaneous nerve that goes over the plate and screw.  Discussed indications for removal of the hardware and she states that she knows she probably have to get out at some point.  I think her current symptoms should be self-limiting and recommended multi modal analgesia with gabapentin, lidocaine prilocaine cream, extra strength Tylenol, and methylprednisolone taper.  She requested pain medications as well discussed with her I could give her a  short prescription for tramadol but and can give her anything beyond this.  Discussed the etiology and treatment options for tinea pedis.  Discussed topical and oral treatment.  Recommended topical treatment with 2% ketoconazole cream.  This was sent to the patient's pharmacy.  Also discussed appropriate foot hygiene, use of antifungal spray such as Tinactin in shoes, as well as cleaning her foot surfaces such as showers and bathroom floors with bleach.   No follow-ups on file.

## 2020-06-12 ENCOUNTER — Other Ambulatory Visit: Payer: Self-pay | Admitting: Podiatry

## 2020-06-12 DIAGNOSIS — S9031XA Contusion of right foot, initial encounter: Secondary | ICD-10-CM

## 2020-06-13 ENCOUNTER — Telehealth: Payer: Self-pay | Admitting: Podiatry

## 2020-06-13 NOTE — Telephone Encounter (Signed)
Pt called and stated she was seen on Monday, pt right foot is still in severe pain. She needs to know what to do next. The ankle brace is not helping the swelling go down. It has gotten worse

## 2020-06-14 ENCOUNTER — Other Ambulatory Visit: Payer: Self-pay | Admitting: Podiatry

## 2020-06-14 ENCOUNTER — Ambulatory Visit: Payer: Medicaid Other | Admitting: Podiatry

## 2020-06-14 ENCOUNTER — Telehealth: Payer: Self-pay | Admitting: Podiatry

## 2020-06-14 NOTE — Telephone Encounter (Signed)
Discussed with her Monday this would be the only prescription for opioids I would write for her, she should continue using the lidocaine cream. She does not have a fracture. She should take tylenol and motrin as needed

## 2020-06-14 NOTE — Telephone Encounter (Signed)
Patient requesting refill for Tramadol.  Please advise

## 2020-06-14 NOTE — Telephone Encounter (Signed)
Patient is concerned she may be charged NO SHOW fee for today, son(primary transportation) was in car wreck, patient wanted me to let you know but has been rescheduled

## 2020-06-17 ENCOUNTER — Other Ambulatory Visit: Payer: Self-pay | Admitting: Podiatry

## 2020-06-17 NOTE — Telephone Encounter (Signed)
Please Advise

## 2020-06-18 ENCOUNTER — Ambulatory Visit (INDEPENDENT_AMBULATORY_CARE_PROVIDER_SITE_OTHER): Payer: Medicaid Other

## 2020-06-18 ENCOUNTER — Ambulatory Visit: Payer: Medicaid Other | Admitting: Podiatry

## 2020-06-18 ENCOUNTER — Other Ambulatory Visit: Payer: Self-pay

## 2020-06-18 DIAGNOSIS — T8484XA Pain due to internal orthopedic prosthetic devices, implants and grafts, initial encounter: Secondary | ICD-10-CM

## 2020-06-18 DIAGNOSIS — M216X1 Other acquired deformities of right foot: Secondary | ICD-10-CM

## 2020-06-18 DIAGNOSIS — M898X9 Other specified disorders of bone, unspecified site: Secondary | ICD-10-CM

## 2020-06-18 MED ORDER — OXYCODONE-ACETAMINOPHEN 5-325 MG PO TABS
1.0000 | ORAL_TABLET | Freq: Three times a day (TID) | ORAL | 0 refills | Status: DC | PRN
Start: 2020-06-18 — End: 2020-07-02

## 2020-06-18 NOTE — Telephone Encounter (Signed)
Patient was seen by Dr. Amalia Hailey today

## 2020-06-18 NOTE — Progress Notes (Signed)
   HPI: 59 y.o. female presenting today for evaluation of right great toe pain.  Patient states that she did sustain an injury to the right great toe when she tripped and fell at her home.  Most recently she has had a significant amount of pain and tenderness to the right great toe joint.  She does have history of first MTPJ arthrodesis to the right foot.  She states that she can feel the orthopedic screws protruding and they are very tender and sensitive to touch.  Past Medical History:  Diagnosis Date  . Diverticulitis   . Hypertension   . S/P ablation operation for arrhythmia    for WPW  . Wolf-Parkinson-White syndrome      Physical Exam: General: The patient is alert and oriented x3 in no acute distress.  Dermatology: Skin is warm, dry and supple bilateral lower extremities. Negative for open lesions or macerations.  Vascular: Palpable pedal pulses bilaterally. No edema or erythema noted. Capillary refill within normal limits.  Neurological: Epicritic and protective threshold grossly intact bilaterally.   Musculoskeletal Exam: Range of motion within normal limits to all pedal and ankle joints bilateral. Muscle strength 5/5 in all groups bilateral.  There is significant on the pain and sensitivity with palpation of the palpable orthopedic screws to the first MTPJ.  Radiographic Exam:  Normal osseous mineralization. Joint spaces preserved. No fracture/dislocation/boney destruction.  Orthopedic hardware is intact although it does appear to be bulky and prominent for the first MTPJ.  There is also some periarticular spurring and exostosis noted around the first MTPJ  Assessment: 1.  Symptomatic orthopedic hardware right first MTPJ 2.  Bone spurs/exostosis first MTPJ right foot   Plan of Care:  1. Patient evaluated. X-Rays reviewed.  2. Today we discussed the conservative versus surgical management of the presenting pathology. The patient opts for surgical management. All possible  complications and details of the procedure were explained. All patient questions were answered. No guarantees were expressed or implied. 3. Authorization for surgery was initiated today. Surgery will consist of removal of orthopedic hardware right first MTPJ.  Exostectomy right first MTPJ 4.  Prescription for Percocet 5/325 mg every 8 hours as needed pain 5.  Return to clinic 1 week postop        Edrick Kins, DPM [facility]  Dr. Edrick Kins, DPM    [address], [place] [address]                Office [phone]  Fax [phone]

## 2020-06-24 ENCOUNTER — Telehealth: Payer: Self-pay

## 2020-06-24 NOTE — Telephone Encounter (Signed)
Patient called requesting a refill of her Percocet to be sent to [facility]   Please advise

## 2020-06-26 ENCOUNTER — Other Ambulatory Visit: Payer: Self-pay | Admitting: Podiatry

## 2020-06-28 ENCOUNTER — Ambulatory Visit: Payer: Medicaid Other | Admitting: Podiatry

## 2020-06-30 NOTE — Telephone Encounter (Signed)
Please advise 

## 2020-07-02 ENCOUNTER — Ambulatory Visit (INDEPENDENT_AMBULATORY_CARE_PROVIDER_SITE_OTHER): Payer: Medicaid Other | Admitting: Podiatry

## 2020-07-02 ENCOUNTER — Other Ambulatory Visit: Payer: Self-pay

## 2020-07-02 DIAGNOSIS — M216X1 Other acquired deformities of right foot: Secondary | ICD-10-CM

## 2020-07-02 DIAGNOSIS — M898X9 Other specified disorders of bone, unspecified site: Secondary | ICD-10-CM | POA: Diagnosis not present

## 2020-07-02 DIAGNOSIS — T8484XA Pain due to internal orthopedic prosthetic devices, implants and grafts, initial encounter: Secondary | ICD-10-CM | POA: Diagnosis not present

## 2020-07-02 DIAGNOSIS — B070 Plantar wart: Secondary | ICD-10-CM | POA: Diagnosis not present

## 2020-07-02 MED ORDER — CLOTRIMAZOLE-BETAMETHASONE 1-0.05 % EX CREA: 1.0000 "application " | TOPICAL_CREAM | Freq: Two times a day (BID) | CUTANEOUS | 1 refills | Status: DC

## 2020-07-02 MED ORDER — OXYCODONE-ACETAMINOPHEN 5-325 MG PO TABS: 1.0000 | ORAL_TABLET | Freq: Three times a day (TID) | ORAL | 0 refills | Status: DC | PRN

## 2020-07-03 NOTE — Progress Notes (Signed)
HPI: 59 y.o. female presenting today for evaluation of right great toe pain.  Patient states that she did sustain an injury to the right great toe when she tripped and fell at her home.  Most recently she has had a significant amount of pain and tenderness to the right great toe joint.  She does have history of first MTPJ arthrodesis to the right foot.  She states that she can feel the orthopedic screws protruding and they are very tender and sensitive to touch.  Past Medical History:  Diagnosis Date  . Diverticulitis   . Hypertension   . S/P ablation operation for arrhythmia    for WPW  . Wolf-Parkinson-White syndrome      Physical Exam: General: The patient is alert and oriented x3 in no acute distress.  Dermatology: Skin is warm, dry and supple bilateral lower extremities. Negative for open lesions or macerations.  Hyperkeratotic discolored dystrophic nails noted to the bilateral feet.  Most symptomatic to the first and second digit of the right foot.  Patient also has a hyperkeratotic skin lesion to the plantar aspect of the left foot.  Central nucleated core consistent with porokeratosis.  Superficial fissuring also noted to the left heel which itches and burns on occasion.  May be consistent with a tinea pedis  Vascular: Palpable pedal pulses bilaterally. No edema or erythema noted. Capillary refill within normal limits.  Neurological: Epicritic and protective threshold grossly intact bilaterally.   Musculoskeletal Exam: Range of motion within normal limits to all pedal and ankle joints bilateral. Muscle strength 5/5 in all groups bilateral.  There is significant on the pain and sensitivity with palpation of the palpable orthopedic screws to the first MTPJ.  Radiographic Exam:  Normal osseous mineralization. Joint spaces preserved. No fracture/dislocation/boney destruction.  Orthopedic hardware is intact although it does appear to be bulky and prominent for the first MTPJ.  There is  also some periarticular spurring and exostosis noted around the first MTPJ  Assessment: 1.  Symptomatic orthopedic hardware right first MTPJ 2.  Bone spurs/exostosis first MTPJ right foot 3.  Porokeratosis left hallux 4.  Dystrophic nails/onychomycosis first and second digit right 5.  Fissuring left heel superficial/possible tinea pedis   Plan of Care:  1. Patient evaluated. X-Rays reviewed.  2.  Patient scheduled for surgery 07/19/2019. Surgery will consist of removal of orthopedic hardware right first MTPJ.  Exostectomy right first MTPJ.  Today we will add consent for total temporary nail avulsions of the first and second digits of the right foot since they are symptomatic and have associated pain with palpation today clinically 4.  Refill prescription for Percocet 5/325 mg every 8 hours as needed pain 5.  Light debridement of the porokeratosis was performed using a surgical 312 scalpel without incident or bleeding.  Cantharone applied.  Post care instructions provided 6.  Prescription for Lotrisone cream to apply to the fissuring of the heels 7.  Return to clinic 1 week postop        Edrick Kins, DPM [facility]  Dr. Edrick Kins, DPM    [address], [place] [address]                Office [phone]  Fax [phone]  [phone]     

## 2020-07-18 ENCOUNTER — Other Ambulatory Visit: Payer: Self-pay | Admitting: Podiatry

## 2020-07-18 DIAGNOSIS — Z4889 Encounter for other specified surgical aftercare: Secondary | ICD-10-CM | POA: Diagnosis not present

## 2020-07-18 DIAGNOSIS — M25774 Osteophyte, right foot: Secondary | ICD-10-CM | POA: Diagnosis not present

## 2020-07-18 DIAGNOSIS — M216X1 Other acquired deformities of right foot: Secondary | ICD-10-CM

## 2020-07-18 DIAGNOSIS — M2021 Hallux rigidus, right foot: Secondary | ICD-10-CM | POA: Diagnosis not present

## 2020-07-18 MED ORDER — OXYCODONE-ACETAMINOPHEN 5-325 MG PO TABS: 1.0000 | ORAL_TABLET | Freq: Three times a day (TID) | ORAL | 0 refills | Status: DC | PRN

## 2020-07-18 NOTE — Progress Notes (Signed)
PRN postop 

## 2020-07-22 ENCOUNTER — Telehealth: Payer: Self-pay

## 2020-07-22 NOTE — Telephone Encounter (Signed)
Patient called requested a refill of her percocet to [facility]    Please advise

## 2020-07-23 ENCOUNTER — Other Ambulatory Visit: Payer: Self-pay | Admitting: Podiatry

## 2020-07-23 NOTE — Telephone Encounter (Signed)
Please advise 

## 2020-07-23 NOTE — Telephone Encounter (Signed)
Patient has been notified of medication sent to pharmacy

## 2020-07-23 NOTE — Telephone Encounter (Signed)
Sent!

## 2020-07-24 ENCOUNTER — Telehealth: Payer: Self-pay | Admitting: Podiatry

## 2020-07-24 NOTE — Telephone Encounter (Signed)
Patient stated that the pain meds are suppose to be every 4 hours as needed since its after sx. The pharmacy will not give her meds until Thursday and she can't wait until then. Please advise

## 2020-07-24 NOTE — Telephone Encounter (Signed)
Patient called in stating prescription ordered was incorrect, stating same information from before surgery. Pharmacy will not fill until order until its  Corrected, Please Advise

## 2020-07-26 ENCOUNTER — Other Ambulatory Visit: Payer: Self-pay

## 2020-07-26 ENCOUNTER — Ambulatory Visit (INDEPENDENT_AMBULATORY_CARE_PROVIDER_SITE_OTHER): Payer: Medicaid Other | Admitting: Podiatry

## 2020-07-26 ENCOUNTER — Ambulatory Visit (INDEPENDENT_AMBULATORY_CARE_PROVIDER_SITE_OTHER): Payer: Medicaid Other

## 2020-07-26 ENCOUNTER — Encounter: Payer: Self-pay | Admitting: Podiatry

## 2020-07-26 VITALS — BP 153/90 | HR 93 | Temp 98.8°F

## 2020-07-26 DIAGNOSIS — Z9889 Other specified postprocedural states: Secondary | ICD-10-CM

## 2020-07-26 DIAGNOSIS — S9031XA Contusion of right foot, initial encounter: Secondary | ICD-10-CM

## 2020-07-26 DIAGNOSIS — T8484XA Pain due to internal orthopedic prosthetic devices, implants and grafts, initial encounter: Secondary | ICD-10-CM

## 2020-07-26 MED ORDER — OXYCODONE-ACETAMINOPHEN 5-325 MG PO TABS: 1.0000 | ORAL_TABLET | ORAL | 0 refills | Status: DC | PRN

## 2020-08-02 ENCOUNTER — Telehealth: Payer: Self-pay | Admitting: Podiatry

## 2020-08-02 ENCOUNTER — Other Ambulatory Visit: Payer: Self-pay | Admitting: Podiatry

## 2020-08-02 ENCOUNTER — Encounter: Payer: Medicaid Other | Admitting: Podiatry

## 2020-08-02 MED ORDER — OXYCODONE-ACETAMINOPHEN 5-325 MG PO TABS: 1.0000 | ORAL_TABLET | ORAL | 0 refills | Status: DC | PRN

## 2020-08-02 NOTE — Telephone Encounter (Signed)
Pt would like a refill on pain meds/ pt appointment has been r/s for 01/25

## 2020-08-02 NOTE — Telephone Encounter (Signed)
Refill sent.

## 2020-08-06 ENCOUNTER — Other Ambulatory Visit: Payer: Self-pay

## 2020-08-06 ENCOUNTER — Ambulatory Visit (INDEPENDENT_AMBULATORY_CARE_PROVIDER_SITE_OTHER): Payer: Medicaid Other | Admitting: Podiatry

## 2020-08-06 DIAGNOSIS — T8484XA Pain due to internal orthopedic prosthetic devices, implants and grafts, initial encounter: Secondary | ICD-10-CM

## 2020-08-06 DIAGNOSIS — Z9889 Other specified postprocedural states: Secondary | ICD-10-CM

## 2020-08-06 MED ORDER — DOXYCYCLINE HYCLATE 100 MG PO TABS: 100.0000 mg | ORAL_TABLET | Freq: Two times a day (BID) | ORAL | 0 refills | Status: DC

## 2020-08-06 MED ORDER — OXYCODONE-ACETAMINOPHEN 5-325 MG PO TABS: 1.0000 | ORAL_TABLET | Freq: Four times a day (QID) | ORAL | 0 refills | Status: DC | PRN

## 2020-08-06 NOTE — Progress Notes (Signed)
   Subjective:  Patient presents today status post removal of hardware with great toe arthroplasty and implant right. DOS: July 18, 2018.Marland Kitchen  Patient states she is doing well.  She continues to have some pain however she is weightbearing as tolerated in the postsurgical shoe.  She presents for further treatment and evaluation  Past Medical History:  Diagnosis Date  . Diverticulitis   . Hypertension   . S/P ablation operation for arrhythmia    for WPW  . Wolf-Parkinson-White syndrome       Objective/Physical Exam Neurovascular status intact.  Skin incisions appear to be well coapted with staples intact. No sign of infectious process noted. No dehiscence. No active bleeding noted. Moderate edema noted to the surgical extremity.  There are some small irritation with erythema localized around the distal portion of the incision site possibly concerning for very localized cellulitis  Assessment: 1. s/p removal of hardware with arthroplasty and implant right. DOS: July 18, 2020   Plan of Care:  1. Patient was evaluated.  2.  Staples removed today.   3.  Prescription for doxycycline 100 mg 2 times daily #20 4.  Refill prescription for Percocet 5/325 mg every 6 hours #30 5.  Continue weightbearing in the postsurgical shoe 6.  Return to clinic in 2 weeks.  At this time we will address the patient's symptomatic dystrophic toenails to the right first and second digits.  Patient will likely need total permanent nail avulsions.   Edrick Kins, DPM [facility]  Dr. Edrick Kins, DPM    [address],  [address]                Office [phone]  Fax [phone]

## 2020-08-12 NOTE — Progress Notes (Signed)
   Subjective:  Patient presents today status post removal of hardware with exostectomy right first MTPJ. DOS: 07/18/2020.  Patient states that she is doing well.  She still has soreness across the top of the foot after surgery.  She is kept the dressings clean dry and intact and walking the postsurgical shoe as instructed.  No new complaints at this time  Past Medical History:  Diagnosis Date  . Diverticulitis   . Hypertension   . S/P ablation operation for arrhythmia    for WPW  . Wolf-Parkinson-White syndrome       Objective/Physical Exam Neurovascular status intact.  Skin incisions appear to be well coapted with staples intact. No sign of infectious process noted. No dehiscence. No active bleeding noted. Moderate edema noted to the surgical extremity.  Radiographic Exam:  Absence of hardware noted to the first MTPJ with osteotomy sites stable with routine healing.  Assessment: 1. s/p removal of hardware with exostectomy first MTPJ right. DOS: 07/18/2018 2.  Symptomatic dystrophic nails first and second digit right foot   Plan of Care:  1. Patient was evaluated. X-rays reviewed 2.  Dressings changed today.  Keep clean dry and intact x1 week 3.  Refill prescription for Percocet 5/325 mg 4.  Continue weightbearing in the postsurgical shoe 5.  During surgery we did not perform total permanent nail avulsions to the first and second digits of the right foot.  Once the incision demonstrates good healing we will proceed with total permanent nail avulsions here in the office. 6.  Return to clinic 1 week   Edrick Kins, DPM [facility]  Dr. Edrick Kins, DPM    [address],  [address]                Office [phone]  Fax [phone]

## 2020-08-16 ENCOUNTER — Telehealth: Payer: Self-pay | Admitting: Podiatry

## 2020-08-16 ENCOUNTER — Encounter: Payer: Medicaid Other | Admitting: Podiatry

## 2020-08-16 NOTE — Telephone Encounter (Signed)
Patient has requested refill on pain meds, Please Advise 

## 2020-08-19 ENCOUNTER — Telehealth: Payer: Self-pay | Admitting: *Deleted

## 2020-08-19 NOTE — Telephone Encounter (Signed)
"  I need some paperwork sent to my attorney for disability.  Call me back at your earliest convenience."

## 2020-08-20 NOTE — Telephone Encounter (Signed)
I attempted to call the patient.  I left her a message asking her to call me back tomorrow.

## 2020-08-22 NOTE — Telephone Encounter (Signed)
"  Someone has been trying to call me."  I called you.  I was returning your call.  "I was calling you back.  I'm scheduled to come in there on tomorrow."  I actually need to reschedule that appointment.  Can you come in on Tuesday instead?  "Yes, that will be fine."

## 2020-08-23 ENCOUNTER — Encounter: Payer: Medicaid Other | Admitting: Podiatry

## 2020-08-27 ENCOUNTER — Other Ambulatory Visit: Payer: Self-pay

## 2020-08-27 ENCOUNTER — Ambulatory Visit (INDEPENDENT_AMBULATORY_CARE_PROVIDER_SITE_OTHER): Payer: Medicaid Other | Admitting: Podiatry

## 2020-08-27 DIAGNOSIS — L603 Nail dystrophy: Secondary | ICD-10-CM | POA: Diagnosis not present

## 2020-08-27 DIAGNOSIS — M79674 Pain in right toe(s): Secondary | ICD-10-CM | POA: Diagnosis not present

## 2020-08-27 DIAGNOSIS — Z9889 Other specified postprocedural states: Secondary | ICD-10-CM

## 2020-08-27 DIAGNOSIS — B351 Tinea unguium: Secondary | ICD-10-CM | POA: Diagnosis not present

## 2020-08-27 MED ORDER — OXYCODONE-ACETAMINOPHEN 5-325 MG PO TABS: 1.0000 | ORAL_TABLET | Freq: Four times a day (QID) | ORAL | 0 refills | Status: DC | PRN

## 2020-08-27 MED ORDER — GENTAMICIN SULFATE 0.1 % EX CREA: 1.0000 "application " | TOPICAL_CREAM | Freq: Two times a day (BID) | CUTANEOUS | 1 refills | Status: DC

## 2020-08-27 NOTE — Progress Notes (Signed)
   Subjective:  Patient presents today status post removal of hardware with great toe arthroplasty and implant right. DOS: 07/18/2020.  Patient states she is doing well.  Patient is recently transition of the postsurgical shoe into good supportive sneakers.  Overall there is significant improvement  Past Medical History:  Diagnosis Date  . Diverticulitis   . Hypertension   . S/P ablation operation for arrhythmia    for WPW  . Wolf-Parkinson-White syndrome       Objective/Physical Exam Neurovascular status intact.  Skin incisions appear to be well coapted and healed. No sign of infectious process noted. No dehiscence. No active bleeding noted.  Minimal edema noted to the surgical extremity.    Noted hyperkeratotic dystrophic nails that are thickened, elongated, and discolored to the first and second digits of the right foot.  Associated tenderness to palpation  Assessment: 1. s/p removal of hardware with arthroplasty and implant right. DOS: 07/18/2020 2.  Pain due to onychomycosis of toenails first and second digit right foot   Plan of Care:  1. Patient was evaluated.  2.  Refill prescription for Percocet 5/325 every 6 hours as needed pain #30 3.  Today we discussed different treatment options regarding the dystrophic onychomycosis to the right great toe and second toe.  The patient would like to have them permanently removed. 4.  The toes were prepped in aseptic manner and digital block performed using 2% lidocaine plain.  Nails were avulsed in toto followed by 3 x 37-[id number] application of phenol and alcohol flush. 5.  Light dressing applied 6.  Prescription for gentamicin cream applied daily  7.  Return to clinic in 3 weeks  Edrick Kins, DPM [facility]  Dr. Edrick Kins, DPM    [address], [place] [address]                Office [phone]  Fax [phone]

## 2020-09-02 ENCOUNTER — Telehealth: Payer: Self-pay

## 2020-09-02 NOTE — Telephone Encounter (Signed)
Pt would [facility].

## 2020-09-03 ENCOUNTER — Ambulatory Visit (INDEPENDENT_AMBULATORY_CARE_PROVIDER_SITE_OTHER): Payer: Medicaid Other | Admitting: Podiatry

## 2020-09-03 ENCOUNTER — Other Ambulatory Visit: Payer: Self-pay

## 2020-09-03 DIAGNOSIS — Z9889 Other specified postprocedural states: Secondary | ICD-10-CM

## 2020-09-03 DIAGNOSIS — M79674 Pain in right toe(s): Secondary | ICD-10-CM

## 2020-09-03 DIAGNOSIS — B351 Tinea unguium: Secondary | ICD-10-CM

## 2020-09-03 MED ORDER — OXYCODONE-ACETAMINOPHEN 5-325 MG PO TABS
1.0000 | ORAL_TABLET | Freq: Four times a day (QID) | ORAL | 0 refills | Status: DC | PRN
Start: 2020-09-03 — End: 2020-09-11

## 2020-09-03 NOTE — Patient Instructions (Signed)
.    Is 17

## 2020-09-03 NOTE — Progress Notes (Signed)
   Subjective: 60 y.o. female presents today status post permanent nail avulsion procedure of the first and second digit of the right foot that was performed on 08/27/2020.  Patient continues to soak her foot and applied antibiotic cream as instructed.  She is weightbearing in the postsurgical shoe as instructed.  No new complaints at this time  Past Medical History:  Diagnosis Date  . Diverticulitis   . Hypertension   . S/P ablation operation for arrhythmia    for WPW  . Wolf-Parkinson-White syndrome     Objective: Skin is warm, dry and supple.  The nailbed appears to be healing appropriately. Open wound to the associated nail bed with a granular wound base and moderate amount of fibrotic tissue. Minimal drainage noted. Mild erythema around the periungual region likely due to phenol chemical matricectomy.  Assessment: #1 postop permanent total nail avulsion first and second digit right foot   Plan of care: #1 patient was evaluated  #2  Continue daily Epsom salt soaks and antibiotic ointment #3 continue postsurgical shoe #4 return to clinic in 4 weeks.   Edrick Kins, DPM [facility]  Dr. Edrick Kins, DPM    [address], [place] [address]                Office [phone]  Fax [phone]

## 2020-09-03 NOTE — Addendum Note (Signed)
Addended by: Edrick Kins on: 09/03/2020 03:51 PM   Modules accepted: Orders

## 2020-09-11 ENCOUNTER — Telehealth: Payer: Self-pay

## 2020-09-11 ENCOUNTER — Other Ambulatory Visit: Payer: Self-pay | Admitting: Podiatry

## 2020-09-11 ENCOUNTER — Telehealth: Payer: Self-pay | Admitting: Podiatry

## 2020-09-11 MED ORDER — OXYCODONE-ACETAMINOPHEN 5-325 MG PO TABS: 1.0000 | ORAL_TABLET | Freq: Four times a day (QID) | ORAL | 0 refills | Status: DC | PRN

## 2020-09-11 NOTE — Telephone Encounter (Signed)
Pt would [facility].

## 2020-09-11 NOTE — Telephone Encounter (Signed)
Refill sent.

## 2020-09-11 NOTE — Telephone Encounter (Signed)
Patient has requested refill on pain meds, please advise

## 2020-09-11 NOTE — Progress Notes (Signed)
PRN postop 

## 2020-09-17 ENCOUNTER — Ambulatory Visit: Payer: Medicaid Other | Admitting: Podiatry

## 2020-09-17 ENCOUNTER — Other Ambulatory Visit: Payer: Self-pay | Admitting: Podiatry

## 2020-09-17 ENCOUNTER — Telehealth: Payer: Self-pay | Admitting: *Deleted

## 2020-09-17 MED ORDER — OXYCODONE-ACETAMINOPHEN 5-325 MG PO TABS
1.0000 | ORAL_TABLET | Freq: Four times a day (QID) | ORAL | 0 refills | Status: DC | PRN
Start: 2020-09-17 — End: 2020-09-27

## 2020-09-17 NOTE — Telephone Encounter (Signed)
Sent!

## 2020-09-17 NOTE — Telephone Encounter (Signed)
"  I need a refill on my pain medication." °

## 2020-09-17 NOTE — Progress Notes (Signed)
PRN postop 

## 2020-09-25 ENCOUNTER — Telehealth: Payer: Self-pay | Admitting: Podiatry

## 2020-09-25 NOTE — Telephone Encounter (Signed)
Patient calling to request refill on pain medication.

## 2020-09-27 ENCOUNTER — Other Ambulatory Visit: Payer: Self-pay

## 2020-09-27 ENCOUNTER — Ambulatory Visit (INDEPENDENT_AMBULATORY_CARE_PROVIDER_SITE_OTHER): Payer: Medicaid Other

## 2020-09-27 ENCOUNTER — Encounter: Payer: Self-pay | Admitting: Podiatry

## 2020-09-27 ENCOUNTER — Ambulatory Visit (INDEPENDENT_AMBULATORY_CARE_PROVIDER_SITE_OTHER): Payer: Medicaid Other | Admitting: Podiatry

## 2020-09-27 DIAGNOSIS — T8484XA Pain due to internal orthopedic prosthetic devices, implants and grafts, initial encounter: Secondary | ICD-10-CM

## 2020-09-27 DIAGNOSIS — Z9889 Other specified postprocedural states: Secondary | ICD-10-CM

## 2020-09-27 DIAGNOSIS — M2041 Other hammer toe(s) (acquired), right foot: Secondary | ICD-10-CM

## 2020-09-27 MED ORDER — OXYCODONE-ACETAMINOPHEN 5-325 MG PO TABS: 1.0000 | ORAL_TABLET | ORAL | 0 refills | Status: DC | PRN

## 2020-09-27 MED ORDER — DOXYCYCLINE HYCLATE 100 MG PO TABS: 100.0000 mg | ORAL_TABLET | Freq: Two times a day (BID) | ORAL | 0 refills | Status: DC

## 2020-09-27 NOTE — Progress Notes (Signed)
   Subjective:  Patient presents today status post removal of hardware with great toe arthroplasty and implant right. DOS: 07/18/2020.  Patient states that despite the nail avulsion to the right second toe she continues to have pain and tenderness since the toes contracted.  She has a history of surgery to the right second toe, PIPJ arthrodesis.  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  She presents for further treatment evaluation  Past Medical History:  Diagnosis Date  . Diverticulitis   . Hypertension   . S/P ablation operation for arrhythmia    for WPW  . Wolf-Parkinson-White syndrome    Objective: Physical Exam General: The patient is alert and oriented x3 in no acute distress.  Dermatology: Skin is cool, dry and supple bilateral lower extremities. Negative for open lesions or macerations.  Nail avulsion sites to the great toe and second toe of the right foot are doing well.  Routine healing noted.  Negative for any erythema or drainage.  Vascular: Palpable pedal pulses bilaterally. No edema or erythema noted. Capillary refill within normal limits.  Neurological: Epicritic and protective threshold grossly intact bilaterally.   Musculoskeletal Exam: All pedal and ankle joints range of motion within normal limits bilateral. Muscle strength 5/5 in all groups bilateral.  Reducible hammertoe contracture deformity noted to the symptomatic toe  Assessment: 1.  Reducible hammertoe right second toe 2.  First MTPJ arthroplasty with implant right.  DOS: 07/18/2020 3.  Total permanent nail avulsions first and second digit RT.  DOS: 08/27/2020   Plan of Care:  1. Patient evaluated.  2.  Different treatment options were discussed with the patient. 3.  After evaluating the patient I do believe that a flexor tenotomy to the respective digit would help alleviate the patient's symptoms.  This would lift the toe to alleviate pressure from the digit.  The procedure was  explained in detail and all patient questions were answered.  No guarantees were expressed or implied.  The patient consented for correction here in the office 4.  Prior to procedure the toe was blocked in a digital block fashion using 3 mL of lidocaine 2%. 5.  Flexor tenotomy was performed of the respective digit using a surgical #11 scalpel and a small percutaneous stab incision on the plantar sulcus of the toe.  The toe was immediately in a more rectus position.  Betadine soaked dry sterile dressing was applied. 6.  Post care instructions were provided 7.  Surgical shoe dispensed 8.  Refill prescription for Percocet 5/325 mg every 4 hours  9.  Prescription for doxycycline 100 mg 2 times daily for prophylaxis  10.  Return to clinic in 1 week  Edrick Kins, DPM [facility]  Dr. Edrick Kins, DPM    [address], [place] [address]                Office [phone]  Fax [phone]

## 2020-10-02 ENCOUNTER — Other Ambulatory Visit: Payer: Self-pay | Admitting: Podiatry

## 2020-10-02 ENCOUNTER — Telehealth: Payer: Self-pay | Admitting: *Deleted

## 2020-10-02 MED ORDER — OXYCODONE-ACETAMINOPHEN 5-325 MG PO TABS: 1.0000 | ORAL_TABLET | ORAL | 0 refills | Status: DC | PRN

## 2020-10-02 NOTE — Telephone Encounter (Signed)
"  I need a refill of my pain medication.  He only gives me a little bit at a time."  I'll let him know.

## 2020-10-02 NOTE — Telephone Encounter (Signed)
Please advise 

## 2020-10-02 NOTE — Telephone Encounter (Signed)
Rx sent. - Dr. Daisja Kessinger

## 2020-10-02 NOTE — Telephone Encounter (Signed)
Patient notified of medication

## 2020-10-04 ENCOUNTER — Ambulatory Visit: Payer: Medicaid Other | Admitting: Podiatry

## 2020-10-08 ENCOUNTER — Other Ambulatory Visit: Payer: Self-pay

## 2020-10-08 ENCOUNTER — Ambulatory Visit (INDEPENDENT_AMBULATORY_CARE_PROVIDER_SITE_OTHER): Payer: Medicaid Other | Admitting: Podiatry

## 2020-10-08 DIAGNOSIS — M2041 Other hammer toe(s) (acquired), right foot: Secondary | ICD-10-CM

## 2020-10-08 DIAGNOSIS — T8484XA Pain due to internal orthopedic prosthetic devices, implants and grafts, initial encounter: Secondary | ICD-10-CM

## 2020-10-08 DIAGNOSIS — Z9889 Other specified postprocedural states: Secondary | ICD-10-CM

## 2020-10-08 MED ORDER — OXYCODONE-ACETAMINOPHEN 5-325 MG PO TABS: 1.0000 | ORAL_TABLET | Freq: Three times a day (TID) | ORAL | 0 refills | Status: DC | PRN

## 2020-10-08 NOTE — Progress Notes (Signed)
   Subjective:  Patient presents today status post removal of hardware with great toe arthroplasty and implant right. DOS: 07/18/2020.  Overall the patient continues to feel much better.  The nail avulsion sites feel much better.  She continues to have some pain around the second toe MTPJ.  She continues to wear the surgical shoe as instructed.  No new complaints at this time  Past Medical History:  Diagnosis Date  . Diverticulitis   . Hypertension   . S/P ablation operation for arrhythmia    for WPW  . Wolf-Parkinson-White syndrome    Objective: Physical Exam General: The patient is alert and oriented x3 in no acute distress.  Dermatology: Skin is cool, dry and supple bilateral lower extremities. Negative for open lesions or macerations.  Nail avulsion sites to the great toe and second toe of the right foot are doing well.  Routine healing noted.  Negative for any erythema or drainage.  Vascular: Palpable pedal pulses bilaterally. No edema or erythema noted. Capillary refill within normal limits.  Neurological: Epicritic and protective threshold grossly intact bilaterally.   Musculoskeletal Exam: All pedal and ankle joints range of motion within normal limits bilateral with exception of the first MTPJ to the right foot surgical site.  Limited range of motion noted.  There is negative pain on palpation however to the joint.. Muscle strength 5/5 in all groups bilateral.  Right second toe is in a good rectus alignment  Assessment: 1.  Status post right flexor tenotomy 09/24/2020 2.  First MTPJ arthroplasty with implant right.  DOS: 07/18/2020 3.  Total permanent nail avulsions first and second digit RT.  DOS: 08/27/2020   Plan of Care:  1. Patient evaluated.  2.  Patient may discontinue postsurgical shoe.  Recommend good supportive shoes and sneakers 3.  Refill prescription for Percocet 5/3 2 5  mg every 8 hours #30 4.  Return to clinic in 6 weeks  Edrick Kins, DPM [facility]  Dr. Edrick Kins, DPM    [address], [place] [address]                Office [phone]  Fax [phone]

## 2020-10-18 ENCOUNTER — Telehealth: Payer: Self-pay | Admitting: *Deleted

## 2020-10-18 ENCOUNTER — Other Ambulatory Visit: Payer: Self-pay | Admitting: Podiatry

## 2020-10-18 MED ORDER — OXYCODONE-ACETAMINOPHEN 5-325 MG PO TABS: 1.0000 | ORAL_TABLET | Freq: Three times a day (TID) | ORAL | 0 refills | Status: DC | PRN

## 2020-10-18 NOTE — Progress Notes (Signed)
PRN postop 

## 2020-10-18 NOTE — Telephone Encounter (Signed)
"  I need a refill on my pain medication, Percocet."  What pharmacy do you use?  "I use [facility]."

## 2020-10-18 NOTE — Telephone Encounter (Signed)
Patient notified of medication via voice mail

## 2020-10-18 NOTE — Telephone Encounter (Signed)
Done

## 2020-10-28 ENCOUNTER — Telehealth: Payer: Self-pay | Admitting: Podiatry

## 2020-10-28 ENCOUNTER — Other Ambulatory Visit: Payer: Self-pay | Admitting: Podiatry

## 2020-10-28 MED ORDER — OXYCODONE-ACETAMINOPHEN 5-325 MG PO TABS
1.0000 | ORAL_TABLET | Freq: Three times a day (TID) | ORAL | 0 refills | Status: DC | PRN
Start: 2020-10-28 — End: 2020-11-12

## 2020-10-28 NOTE — Telephone Encounter (Signed)
Rx sent 

## 2020-10-28 NOTE — Telephone Encounter (Signed)
Patient called requesting refill on pain meds (perocet 5/325), Please Advise

## 2020-10-30 ENCOUNTER — Other Ambulatory Visit: Payer: Self-pay | Admitting: Family Medicine

## 2020-10-30 DIAGNOSIS — Z1231 Encounter for screening mammogram for malignant neoplasm of breast: Secondary | ICD-10-CM

## 2020-11-08 ENCOUNTER — Ambulatory Visit: Payer: Medicaid Other | Admitting: Podiatry

## 2020-11-12 ENCOUNTER — Other Ambulatory Visit: Payer: Self-pay | Admitting: Podiatry

## 2020-11-12 ENCOUNTER — Telehealth: Payer: Self-pay | Admitting: *Deleted

## 2020-11-12 MED ORDER — OXYCODONE-ACETAMINOPHEN 5-325 MG PO TABS
1.0000 | ORAL_TABLET | Freq: Three times a day (TID) | ORAL | 0 refills | Status: DC | PRN
Start: 2020-11-12 — End: 2020-11-22

## 2020-11-12 NOTE — Telephone Encounter (Signed)
Rx sent 

## 2020-11-12 NOTE — Telephone Encounter (Signed)
"  I need a refill of my medicine."

## 2020-11-19 ENCOUNTER — Telehealth: Payer: Self-pay | Admitting: *Deleted

## 2020-11-19 ENCOUNTER — Encounter: Payer: Medicaid Other | Admitting: Podiatry

## 2020-11-19 NOTE — Telephone Encounter (Signed)
"  I need to cancel my appointment for today.  I'm sorry, my husband was in an accident last week.  He's in surgery right now at [facility].  He's had internal bleeding all this time.  I will call back to reschedule my surgery.  In the meantime, I need a refill of my pain medicine."  I've canceled the appointment and I will give Dr. Amalia Hailey your message.  "He's a good doctor.  I appreciate all you have done for me."

## 2020-11-20 NOTE — Telephone Encounter (Signed)
"  I'm a patient of Dr. Amalia Hailey.  I was calling for a refill of my pain medicine.  Call it in to [place]."

## 2020-11-22 ENCOUNTER — Other Ambulatory Visit: Payer: Self-pay | Admitting: Podiatry

## 2020-11-22 NOTE — Telephone Encounter (Signed)
Please advise 

## 2020-11-22 NOTE — Telephone Encounter (Signed)
Sent!

## 2020-11-26 ENCOUNTER — Ambulatory Visit: Payer: Medicaid Other | Admitting: Podiatry

## 2020-12-02 ENCOUNTER — Telehealth: Payer: Self-pay

## 2020-12-02 NOTE — Telephone Encounter (Signed)
Pt called requesting [facility]

## 2020-12-03 ENCOUNTER — Other Ambulatory Visit: Payer: Self-pay | Admitting: Podiatry

## 2020-12-03 NOTE — Telephone Encounter (Signed)
Rx sent 

## 2020-12-06 ENCOUNTER — Ambulatory Visit: Payer: Medicaid Other | Admitting: Podiatry

## 2020-12-10 ENCOUNTER — Ambulatory Visit: Payer: Medicaid Other | Admitting: Podiatry

## 2020-12-23 ENCOUNTER — Telehealth: Payer: Self-pay

## 2020-12-23 NOTE — Telephone Encounter (Signed)
Pt requesting pain medication be sent to [facility].

## 2020-12-23 NOTE — Telephone Encounter (Signed)
No refills. I haven't seen this patient since March 29 and she has cancelled multiple appts. - Dr. Amalia Hailey

## 2020-12-24 NOTE — Telephone Encounter (Signed)
Called to inform pt of the message below, no answer. Will call pt again to follow up.

## 2020-12-31 ENCOUNTER — Ambulatory Visit: Payer: Medicaid Other | Admitting: Podiatry

## 2021-01-21 ENCOUNTER — Ambulatory Visit: Payer: Medicaid Other | Admitting: Podiatry

## 2021-01-24 ENCOUNTER — Ambulatory Visit (INDEPENDENT_AMBULATORY_CARE_PROVIDER_SITE_OTHER): Payer: Medicaid Other

## 2021-01-24 ENCOUNTER — Ambulatory Visit (INDEPENDENT_AMBULATORY_CARE_PROVIDER_SITE_OTHER): Payer: Medicaid Other | Admitting: Podiatry

## 2021-01-24 ENCOUNTER — Other Ambulatory Visit: Payer: Self-pay

## 2021-01-24 DIAGNOSIS — S92511A Displaced fracture of proximal phalanx of right lesser toe(s), initial encounter for closed fracture: Secondary | ICD-10-CM | POA: Diagnosis not present

## 2021-01-24 DIAGNOSIS — S92504A Nondisplaced unspecified fracture of right lesser toe(s), initial encounter for closed fracture: Secondary | ICD-10-CM

## 2021-01-24 MED ORDER — OXYCODONE-ACETAMINOPHEN 5-325 MG PO TABS: ORAL_TABLET | ORAL | 0 refills | Status: DC

## 2021-01-24 MED ORDER — CLOTRIMAZOLE-BETAMETHASONE 1-0.05 % EX CREA: 1.0000 "application " | TOPICAL_CREAM | Freq: Two times a day (BID) | CUTANEOUS | 3 refills | Status: DC

## 2021-01-24 NOTE — Progress Notes (Signed)
   Subjective:  Patient presents today status post removal of hardware with cheilectomy right great toe. DOS: 07/18/2020.  Patient states that most recently she has a new complaint today regarding pain and tenderness to the right third toe.  She states that she injured her right third toe on a chair at home.  She is concerned for possible fracture.  She is unsure when the exact date was however she states it was 1-2 weeks ago.  She has not done anything for treatment.  She presents for further treatment and evaluation  Past Medical History:  Diagnosis Date   Diverticulitis    Hypertension    S/P ablation operation for arrhythmia    for WPW   Wolf-Parkinson-White syndrome    Objective: Physical Exam General: The patient is alert and oriented x3 in no acute distress.  Dermatology: No open lesions noted.  Integument is intact.  There is some hyperkeratosis with peeling of the skin and slight dermatitis in the inflammatory process of the skin along the weightbearing surfaces of the feet with associated pruritus consistent with findings of a tinea pedis  Vascular: Palpable pedal pulses bilaterally. No edema or erythema noted. Capillary refill within normal limits.  Neurological: Epicritic and protective threshold grossly intact bilaterally.   Musculoskeletal Exam: Slight tenderness to palpation to the third toe right foot.  No edema.  No ecchymosis noted.  The right third toe PIPJ is slightly dorsally subluxed compared to the adjacent digits  Radiographic exam: There may be some subtle cortical irregularity and fracture noted to the proximal phalanx of the right third toe although this is hard to identify clearly.  Assessment: 1.  Fracture third digit right foot 2.  History of hardware removal and cheilectomy right first MTPJ.  January 2022 3.  History of total permanent nail avulsion right hallux 4.  Tinea pedis bilateral   Plan of Care:  1. Patient evaluated.  2.  Refill prescription  for Lotrisone cream apply 2 times daily 3.  Regarding the fracture, explained to the patient that it should heal over time.  We will simply observe for now.  Recommend good supportive shoes and sneakers that do not irritate the area 4.  Refill prescription for Percocet 5/325 mg.  This is for 1 refill for breakthrough pain.  No refills after 5.  Return to clinic as needed  Edrick Kins, DPM [facility]  Dr. Edrick Kins, DPM    [address], [place] [address]                Office [phone]  Fax [phone]

## 2021-02-07 ENCOUNTER — Telehealth: Payer: Medicaid Other | Admitting: Podiatry

## 2021-02-07 NOTE — Telephone Encounter (Signed)
Patient called stating she has some pain still in her foot and would like some pain medication. Patients phone number is [phone].

## 2021-02-11 NOTE — Telephone Encounter (Signed)
She call to ask if dr Amalia Hailey can send in a refill on her pain medication

## 2021-02-11 NOTE — Telephone Encounter (Signed)
No refills. - Dr. Jakai Risse

## 2021-05-27 ENCOUNTER — Ambulatory Visit: Payer: Medicaid Other | Admitting: Podiatry

## 2021-06-03 ENCOUNTER — Ambulatory Visit (INDEPENDENT_AMBULATORY_CARE_PROVIDER_SITE_OTHER): Payer: Medicaid Other

## 2021-06-03 ENCOUNTER — Other Ambulatory Visit: Payer: Self-pay

## 2021-06-03 ENCOUNTER — Ambulatory Visit: Payer: Medicaid Other | Admitting: Podiatry

## 2021-06-03 DIAGNOSIS — L989 Disorder of the skin and subcutaneous tissue, unspecified: Secondary | ICD-10-CM

## 2021-06-03 DIAGNOSIS — M778 Other enthesopathies, not elsewhere classified: Secondary | ICD-10-CM

## 2021-06-03 DIAGNOSIS — M7752 Other enthesopathy of left foot: Secondary | ICD-10-CM

## 2021-06-03 DIAGNOSIS — M79674 Pain in right toe(s): Secondary | ICD-10-CM

## 2021-06-03 DIAGNOSIS — B351 Tinea unguium: Secondary | ICD-10-CM | POA: Diagnosis not present

## 2021-06-11 NOTE — Progress Notes (Signed)
   SUBJECTIVE Patient presents to office today complaining of elongated, thickened nails that cause pain while ambulating in shoes.  Patient also states she has pain and tenderness to the right plantar forefoot.  It is very painful with walking.  She presents for further treatment and evaluation  Past Medical History:  Diagnosis Date   Diverticulitis    Hypertension    S/P ablation operation for arrhythmia    for WPW   Wolf-Parkinson-White syndrome     OBJECTIVE General Patient is awake, alert, and oriented x 3 and in no acute distress. Derm Skin is dry and supple bilateral. Negative open lesions or macerations. Remaining integument unremarkable. Nails are tender, long, thickened and dystrophic with subungual debris, consistent with onychomycosis, 1-5 right. No signs of infection noted.  Hyperkeratotic callus tissue noted to the plantar aspect of the third MTP joint right Vasc  DP and PT pedal pulses palpable bilaterally. Temperature gradient within normal limits.  Neuro Epicritic and protective threshold sensation grossly intact bilaterally.  Musculoskeletal Exam No symptomatic pedal deformities noted bilateral. Muscular strength within normal limits.  Tenderness to palpation plantar aspect of the third MTP callus  ASSESSMENT 1.  Pain due to onychomycosis of toenails right foot 2.  Symptomatic callus plantar aspect third MTP joint right 3.  History of hardware removal and cheilectomy right first MTP joint.  January 2022 4. H/o fracture of third digit right foot; healed 5. H/o total permanent nail avulsion right hallux  PLAN OF CARE 1. Patient evaluated today.  2. Instructed to maintain good pedal hygiene and foot care.  3. Mechanical debridement of nails 1-5 right performed using a nail nipper. Filed with dremel without incident.  4.  Excisional debridement of the hyperkeratotic symptomatic callus lesion was performed without incident or bleeding  5.  Patient was requesting opioid  pain medication today.  No opioids were prescribed.  I do not feel it is appropriate to prescribe the patient opioids at this time since she mainly presents for his routine foot care 6.  Return to clinic as needed   Edrick Kins, DPM [facility]  Dr. Edrick Kins, DPM    [address], [place] [address]                Office [phone]  Fax [phone]

## 2021-11-14 ENCOUNTER — Ambulatory Visit: Payer: Medicaid Other | Admitting: Podiatry

## 2021-11-21 ENCOUNTER — Ambulatory Visit (INDEPENDENT_AMBULATORY_CARE_PROVIDER_SITE_OTHER): Payer: Medicaid Other | Admitting: Podiatry

## 2021-11-21 DIAGNOSIS — B353 Tinea pedis: Secondary | ICD-10-CM | POA: Diagnosis not present

## 2021-11-21 MED ORDER — CLOTRIMAZOLE-BETAMETHASONE 1-0.05 % EX CREA: 1.0000 "application " | TOPICAL_CREAM | Freq: Two times a day (BID) | CUTANEOUS | 3 refills | Status: DC

## 2021-11-21 NOTE — Progress Notes (Signed)
? ?  HPI: 61 y.o. 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  Patient states that she was doing well for a while however she has not been applying the Lotrisone cream.  She does not have any more cream available.  She says that with the cream she had significant improvement.  She presents for further treatment and evaluation ? ?Past Medical History:  ?Diagnosis Date  ? Diverticulitis   ? Hypertension   ? S/P ablation operation for arrhythmia   ? for WPW  ? Wolf-Parkinson-White syndrome   ? ? ?Past Surgical History:  ?Procedure Laterality Date  ? ABDOMINAL SURGERY    ? CHOLECYSTECTOMY    ? COLONOSCOPY    ? COLOSTOMY    ? COLOSTOMY REVISION    ? ? ?Allergies  ?Allergen Reactions  ? Ciprofloxacin Rash  ? Morphine Rash  ? ?  ?Physical Exam: ?General: The patient is alert and oriented x3 in no acute distress. ? ?Dermatology: Hyperkeratosis of skin noted with diffuse peeling of the skin and small pinpoint vesicles also noted along the medial longitudinal arch of the foot with pruritus ? ?Vascular: Palpable pedal pulses bilaterally. Capillary refill within normal limits.  Negative for any significant edema or erythema ? ?Neurological: Light touch and protective threshold grossly intact ? ?Musculoskeletal Exam: No pedal deformities noted ? ?Assessment: ?1.  Tinea pedis bilateral ? ? ?Plan of Care:  ?1. Patient evaluated.  ?2.  Refill prescription for Lotrisone cream apply 2 times daily. ?3.  Recommend good foot hygiene and washing her foot daily and wearing good supportive shoes and sneakers ?4.  Return to clinic as needed ?  ?Edrick Kins, DPM ?[facility] ? ?Dr. Edrick Kins, DPM  ?  ?[address]                                        ?[place], [place] [address]                ?Office [phone]  ?Fax [phone] ? ? ? ? ?

## 2021-11-29 ENCOUNTER — Emergency Department: Payer: Medicaid Other

## 2021-11-29 ENCOUNTER — Other Ambulatory Visit: Payer: Self-pay

## 2021-11-29 ENCOUNTER — Inpatient Hospital Stay
Admission: EM | Admit: 2021-11-29 | Discharge: 2021-12-01 | DRG: 101 | Disposition: A | Discharge status: Home / Self Care | Payer: Medicaid Other | Attending: Internal Medicine | Admitting: Internal Medicine

## 2021-11-29 DIAGNOSIS — R319 Hematuria, unspecified: Secondary | ICD-10-CM | POA: Diagnosis present

## 2021-11-29 DIAGNOSIS — F17210 Nicotine dependence, cigarettes, uncomplicated: Secondary | ICD-10-CM | POA: Diagnosis present

## 2021-11-29 DIAGNOSIS — G40909 Epilepsy, unspecified, not intractable, without status epilepticus: Principal | ICD-10-CM | POA: Diagnosis present

## 2021-11-29 DIAGNOSIS — K219 Gastro-esophageal reflux disease without esophagitis: Secondary | ICD-10-CM | POA: Diagnosis present

## 2021-11-29 DIAGNOSIS — F1210 Cannabis abuse, uncomplicated: Secondary | ICD-10-CM | POA: Diagnosis present

## 2021-11-29 DIAGNOSIS — S0180XA Unspecified open wound of other part of head, initial encounter: Secondary | ICD-10-CM | POA: Diagnosis present

## 2021-11-29 DIAGNOSIS — Z79899 Other long term (current) drug therapy: Secondary | ICD-10-CM

## 2021-11-29 DIAGNOSIS — F17200 Nicotine dependence, unspecified, uncomplicated: Secondary | ICD-10-CM | POA: Diagnosis present

## 2021-11-29 DIAGNOSIS — Z7982 Long term (current) use of aspirin: Secondary | ICD-10-CM

## 2021-11-29 DIAGNOSIS — R197 Diarrhea, unspecified: Secondary | ICD-10-CM | POA: Diagnosis present

## 2021-11-29 DIAGNOSIS — M549 Dorsalgia, unspecified: Secondary | ICD-10-CM | POA: Diagnosis present

## 2021-11-29 DIAGNOSIS — F1410 Cocaine abuse, uncomplicated: Secondary | ICD-10-CM | POA: Diagnosis present

## 2021-11-29 DIAGNOSIS — R54 Age-related physical debility: Secondary | ICD-10-CM | POA: Diagnosis present

## 2021-11-29 DIAGNOSIS — J449 Chronic obstructive pulmonary disease, unspecified: Secondary | ICD-10-CM | POA: Diagnosis present

## 2021-11-29 DIAGNOSIS — F1910 Other psychoactive substance abuse, uncomplicated: Secondary | ICD-10-CM | POA: Diagnosis present

## 2021-11-29 DIAGNOSIS — S0181XA Laceration without foreign body of other part of head, initial encounter: Secondary | ICD-10-CM | POA: Diagnosis present

## 2021-11-29 DIAGNOSIS — Z791 Long term (current) use of non-steroidal anti-inflammatories (NSAID): Secondary | ICD-10-CM

## 2021-11-29 DIAGNOSIS — F32A Depression, unspecified: Secondary | ICD-10-CM | POA: Diagnosis present

## 2021-11-29 DIAGNOSIS — S0121XA Laceration without foreign body of nose, initial encounter: Secondary | ICD-10-CM | POA: Diagnosis present

## 2021-11-29 DIAGNOSIS — G894 Chronic pain syndrome: Secondary | ICD-10-CM | POA: Diagnosis present

## 2021-11-29 DIAGNOSIS — I456 Pre-excitation syndrome: Secondary | ICD-10-CM | POA: Diagnosis present

## 2021-11-29 DIAGNOSIS — Z6825 Body mass index (BMI) 25.0-25.9, adult: Secondary | ICD-10-CM

## 2021-11-29 DIAGNOSIS — Z885 Allergy status to narcotic agent status: Secondary | ICD-10-CM

## 2021-11-29 DIAGNOSIS — F411 Generalized anxiety disorder: Secondary | ICD-10-CM | POA: Diagnosis present

## 2021-11-29 DIAGNOSIS — R569 Unspecified convulsions: Secondary | ICD-10-CM

## 2021-11-29 DIAGNOSIS — I10 Essential (primary) hypertension: Secondary | ICD-10-CM | POA: Diagnosis present

## 2021-11-29 DIAGNOSIS — G629 Polyneuropathy, unspecified: Secondary | ICD-10-CM

## 2021-11-29 DIAGNOSIS — Z9049 Acquired absence of other specified parts of digestive tract: Secondary | ICD-10-CM

## 2021-11-29 DIAGNOSIS — Z23 Encounter for immunization: Secondary | ICD-10-CM

## 2021-11-29 DIAGNOSIS — E785 Hyperlipidemia, unspecified: Secondary | ICD-10-CM | POA: Diagnosis present

## 2021-11-29 DIAGNOSIS — R300 Dysuria: Secondary | ICD-10-CM | POA: Diagnosis present

## 2021-11-29 DIAGNOSIS — W19XXXA Unspecified fall, initial encounter: Secondary | ICD-10-CM | POA: Diagnosis present

## 2021-11-29 DIAGNOSIS — E663 Overweight: Secondary | ICD-10-CM | POA: Diagnosis present

## 2021-11-29 LAB — URINALYSIS, ROUTINE W REFLEX MICROSCOPIC
Bilirubin Urine: NEGATIVE
Glucose, UA: NEGATIVE mg/dL
Hgb urine dipstick: NEGATIVE
Ketones, ur: NEGATIVE mg/dL
Nitrite: NEGATIVE
Protein, ur: NEGATIVE mg/dL
Specific Gravity, Urine: 1.017 (ref 1.005–1.030)
pH: 5 (ref 5.0–8.0)

## 2021-11-29 LAB — COMPREHENSIVE METABOLIC PANEL
ALT: 13 U/L (ref 0–44)
AST: 22 U/L (ref 15–41)
Albumin: 4 g/dL (ref 3.5–5.0)
Alkaline Phosphatase: 67 U/L (ref 38–126)
Anion gap: 6 (ref 5–15)
BUN: 20 mg/dL (ref 8–23)
CO2: 23 mmol/L (ref 22–32)
Calcium: 9 mg/dL (ref 8.9–10.3)
Chloride: 106 mmol/L (ref 98–111)
Creatinine, Ser: 0.75 mg/dL (ref 0.44–1.00)
GFR, Estimated: >60 mL/min (ref >60)
Glucose, Bld: 140 mg/dL — ABNORMAL HIGH (ref 70–99)
Potassium: 3.7 mmol/L (ref 3.5–5.1)
Sodium: 135 mmol/L (ref 135–145)
Total Bilirubin: 0.7 mg/dL (ref 0.3–1.2)
Total Protein: 6.9 g/dL (ref 6.5–8.1)

## 2021-11-29 LAB — CBC
HCT: 36.6 % (ref 36.0–46.0)
Hemoglobin: 12 g/dL (ref 12.0–15.0)
MCH: 29.3 pg (ref 26.0–34.0)
MCHC: 32.8 g/dL (ref 30.0–36.0)
MCV: 89.5 fL (ref 80.0–100.0)
Platelets: 362 10*3/uL (ref 150–400)
RBC: 4.09 MIL/uL (ref 3.87–5.11)
RDW: 14.6 % (ref 11.5–15.5)
WBC: 8.1 10*3/uL (ref 4.0–10.5)
nRBC: 0 % (ref 0.0–0.2)

## 2021-11-29 MED ORDER — GABAPENTIN 400 MG PO CAPS
800.0000 mg | ORAL_CAPSULE | Freq: Once | ORAL | Status: AC
  Administered 2021-11-29: 800 mg via ORAL
  Filled 2021-11-29: qty 2

## 2021-11-29 MED ORDER — LIDOCAINE 5 % EX PTCH
1.0000 | MEDICATED_PATCH | CUTANEOUS | Status: DC
  Administered 2021-11-29 – 2021-11-30 (×2): 1 via TRANSDERMAL
  Filled 2021-11-29 (×3): qty 1

## 2021-11-29 MED ORDER — LORAZEPAM 2 MG/ML IJ SOLN: 2.0000 mg | Freq: Four times a day (QID) | INTRAMUSCULAR | Status: DC | PRN

## 2021-11-29 MED ORDER — ONDANSETRON HCL 4 MG/2ML IJ SOLN: 4.0000 mg | Freq: Four times a day (QID) | INTRAMUSCULAR | Status: DC | PRN

## 2021-11-29 MED ORDER — ACETAMINOPHEN 500 MG PO TABS
1000.0000 mg | ORAL_TABLET | Freq: Once | ORAL | Status: AC
Start: 2021-11-29 — End: 2021-11-29
  Administered 2021-11-29: 1000 mg via ORAL
  Filled 2021-11-29: qty 2

## 2021-11-29 MED ORDER — ENOXAPARIN SODIUM 40 MG/0.4ML IJ SOSY
40.0000 mg | PREFILLED_SYRINGE | INTRAMUSCULAR | Status: DC
  Administered 2021-11-30 – 2021-12-01 (×2): 40 mg via SUBCUTANEOUS
  Filled 2021-11-29 (×2): qty 0.4

## 2021-11-29 MED ORDER — LEVETIRACETAM IN NACL 1000 MG/100ML IV SOLN
1000.0000 mg | Freq: Once | INTRAVENOUS | Status: AC
  Administered 2021-11-29: 1000 mg via INTRAVENOUS
  Filled 2021-11-29: qty 100

## 2021-11-29 MED ORDER — GADOBUTROL 1 MMOL/ML IV SOLN
6.0000 mL | Freq: Once | INTRAVENOUS | Status: AC | PRN
  Administered 2021-11-29: 6 mL via INTRAVENOUS

## 2021-11-29 MED ORDER — ACETAMINOPHEN 325 MG PO TABS
650.0000 mg | ORAL_TABLET | Freq: Four times a day (QID) | ORAL | Status: DC | PRN
  Administered 2021-11-30 – 2021-12-01 (×3): 650 mg via ORAL
  Filled 2021-11-29 (×4): qty 2

## 2021-11-29 MED ORDER — TETANUS-DIPHTH-ACELL PERTUSSIS 5-2.5-18.5 LF-MCG/0.5 IM SUSY
0.5000 mL | PREFILLED_SYRINGE | Freq: Once | INTRAMUSCULAR | Status: AC
  Administered 2021-11-29: 0.5 mL via INTRAMUSCULAR
  Filled 2021-11-29: qty 0.5

## 2021-11-29 MED ORDER — POLYETHYLENE GLYCOL 3350 17 G PO PACK: 17.0000 g | PACK | Freq: Every day | ORAL | Status: DC | PRN

## 2021-11-29 MED ORDER — ACETAMINOPHEN 650 MG RE SUPP: 650.0000 mg | Freq: Four times a day (QID) | RECTAL | Status: DC | PRN

## 2021-11-29 MED ORDER — ONDANSETRON HCL 4 MG PO TABS: 4.0000 mg | ORAL_TABLET | Freq: Four times a day (QID) | ORAL | Status: DC | PRN

## 2021-11-29 NOTE — ED Notes (Signed)
This RN spoke with EDP regarding patient repeatedly falling asleep during this RN interaction at bedside. Pt awakens easily however does fall back asleep. Pt repeatedly c/o pain, states the gabapentin has does "nothing for the pain" despite being asleep. Pt visualized in NAD at this time.

## 2021-11-29 NOTE — ED Notes (Signed)
This RN at bedside, drawing labs. Pt noted to be sleeping soundly while this RN at bedside. Pt then awakens and requests pain medication. This RN explained unable to give patient further pain medication at this time. Admitting MD Cox made aware.

## 2021-11-29 NOTE — ED Notes (Signed)
Pt to ED via POV with c/o seizures. Pt states last seizure several years ago. Pt with noted abrasions to face, nose, forehead. Pt states hit her head on wood railing, then fell on her butt, pt c/o pain to face, back, R knee, and toes. Pt A&O x4, able to answer all questions. Pt in NSR on the monitor. Pt visualized in NAD at this time. Pt states initially was confused, now is able to states events that she was told and answer all questions appropriately. Pt states does not take seizure medications at baseline.    Call bell placed within reach of patient at this time. Pt denies further needs at this time.

## 2021-11-29 NOTE — ED Notes (Signed)
This RN called to MRI to start IV for MRI with contrast. Pt tolerated well. Pt initially noted to be sleeping, easily awakened upon this RN arrival. Pt c/o "muscle spasms" to her foot. IV initiated by this RN.

## 2021-11-29 NOTE — ED Notes (Signed)
Pt assisted to the bathroom at this time by this RN. UA obtained by this RN.

## 2021-11-29 NOTE — ED Provider Notes (Addendum)
[facility] Provider Note    Event Date/Time   First MD Initiated Contact with Patient 11/29/21 2015     (approximate)   History   Seizures   HPI  Alison Walker is a 61 y.o. female with a history of seizures, intra-abdominal abscess, COPD, WPW, ventral hernia, hypertension who comes in with concern for seizures.  Patient reports having a witnessed seizure that was tonic-clonic movement.  She reports having fell onto her face and she got abrasions on her face and her toes.  She reports hurting her back as well.  She reports 1 prior seizure but not on any medications.   I reviewed the notes for patient was seen at [facility] on 03/2020 for seizures at [facility].  At that time patient patient was referred to neurology   Physical Exam   Triage Vital Signs: ED Triage Vitals  Enc Vitals Group     BP 11/29/21 1802 (!) 117/51     Pulse Rate 11/29/21 1802 100     Resp 11/29/21 1802 18     Temp 11/29/21 1802 98.5 F (36.9 C)     Temp Source 11/29/21 1802 Oral     SpO2 11/29/21 1802 97 %     Weight 11/29/21 1759 138 lb (62.6 kg)     Height 11/29/21 1759 '5\' 2"'$  (1.575 m)     Head Circumference --      Peak Flow --      Pain Score 11/29/21 1759 8     Pain Loc --      Pain Edu? --      Excl. in [place]? --     Most recent vital signs: Vitals:   11/29/21 1802 11/29/21 2031  BP: (!) 117/51 133/72  Pulse: 100 74  Resp: 18 (!) 21  Temp: 98.5 F (36.9 C)   SpO2: 97% 100%     General: Awake, no distress.  CV:  Good peripheral perfusion.  Resp:  Normal effort.  Abd:  No distention.  Soft and nontender Other:  Equal strength in arms and legs.  She does appear confused however   ED Results / Procedures / Treatments   Labs (all labs ordered are listed, but only abnormal results are displayed) Labs Reviewed  COMPREHENSIVE METABOLIC PANEL - Abnormal; Notable for the following components:      Result Value   Glucose, Bld 140 (*)    All other components within normal  limits  CBC  CBG MONITORING, ED     EKG  My interpretation of EKG:  Sinus rhythm 96 without any ST elevation or T wave version except aVL, normal intervals  RADIOLOGY I have reviewed the CT head personally and interpreted it without any evidence of intracranial hemorrhage   PROCEDURES:  Critical Care performed: No  .1-3 Lead EKG Interpretation Performed by: Vanessa [place], MD Authorized by: Vanessa [place], MD     Interpretation: normal     ECG rate:  70   ECG rate assessment: normal     Rhythm: sinus rhythm     Ectopy: none     Conduction: normal     MEDICATIONS ORDERED IN ED: Medications  lidocaine (LIDODERM) 5 % 1 patch (1 patch Transdermal Patch Applied 11/29/21 2111)  levETIRAcetam (KEPPRA) IVPB 1000 mg/100 mL premix (has no administration in time range)  Tdap (BOOSTRIX) injection 0.5 mL (has no administration in time range)  acetaminophen (TYLENOL) tablet 1,000 mg (1,000 mg Oral Given 11/29/21 2110)  gabapentin (NEURONTIN)  capsule 800 mg (800 mg Oral Given 11/29/21 2110)  gadobutrol (GADAVIST) 1 MMOL/ML injection 6 mL (6 mLs Intravenous Contrast Given 11/29/21 2248)     IMPRESSION / MDM / ASSESSMENT AND PLAN / ED COURSE  I reviewed the triage vital signs and the nursing notes.   Patient comes in with a second seizure not on any medications.  I concern for acute life-threatening pathology such as stroke or brain mass.  Will get MRI to further evaluate.  Labs to evaluate for any electrolyte abnormalities, CTs evaluate for any trauma  CBC normal UA possible UTI will send for culture.  CMP reassuring  CTs and x-rays reassuring  MRI negative  Reevaluated patient discussed discharge home but she reports still feeling like she is having difficulties finding her words and that she feels really weak with standing.  Given this concern for possibly prolonged postictal she would feel more comfortable being monitored in the hospital overnight.  I given her a dose of IV Keppra  and will admit to the hospital team for neurology consult in the morning and potentially starting antiepileptics.   Denies alcohol use so doubt etoh withdraw and vitals dont suggest withdraw.    The patient is on the cardiac monitor to evaluate for evidence of arrhythmia and/or significant heart rate changes.      FINAL CLINICAL IMPRESSION(S) / ED DIAGNOSES   Final diagnoses:  Seizure ([place])     Rx / DC Orders   ED Discharge Orders     None        Note:  This document was prepared using Dragon voice recognition software and may include unintentional dictation errors.   Vanessa Hastings, MD 11/29/21 2324    Vanessa [place], MD 11/29/21 4818    Vanessa Beadle, MD 11/29/21 2337

## 2021-11-29 NOTE — ED Notes (Signed)
Medications administered per order. Pt provided with 2 warm blankets. Pt taken to CT at this time.

## 2021-11-29 NOTE — Hospital Course (Addendum)
61 year old female with remote history of Wolff-Parkinson-White syndrome status post ablation as well as history of colovaginal fistula, history of intra-abdominal abscess in 2021, and history of seizure in 2021, currently not on antiseizure medications, no follow-up with neurology due to transportation difficulty, brought by EMS to the emergency room on 5/20 after having witnessed tonic-clonic seizure.  Patient given Keppra in the emergency room.  Admitted to the hospitalist service and started on daily Keppra.  Neurology consulted.

## 2021-11-29 NOTE — ED Notes (Signed)
Pt returned from MRI, placed back on monitor at this time. NAD noted.

## 2021-11-29 NOTE — ED Triage Notes (Signed)
Pt arrives via EMS from home for a seizure- pt did hit her head on the railing of her porch- neighbor saw her and called EMS- last seizure was 1 year ago- pt has a hx of seizures but is not on medications for it- pt VSS- pt endorses pain in head and back- pt does have some confusion

## 2021-11-30 ENCOUNTER — Observation Stay: Payer: Medicaid Other

## 2021-11-30 DIAGNOSIS — Z791 Long term (current) use of non-steroidal anti-inflammatories (NSAID): Secondary | ICD-10-CM | POA: Diagnosis not present

## 2021-11-30 DIAGNOSIS — I10 Essential (primary) hypertension: Secondary | ICD-10-CM | POA: Diagnosis present

## 2021-11-30 DIAGNOSIS — Z6825 Body mass index (BMI) 25.0-25.9, adult: Secondary | ICD-10-CM | POA: Diagnosis not present

## 2021-11-30 DIAGNOSIS — R569 Unspecified convulsions: Secondary | ICD-10-CM | POA: Diagnosis present

## 2021-11-30 DIAGNOSIS — F411 Generalized anxiety disorder: Secondary | ICD-10-CM | POA: Diagnosis present

## 2021-11-30 DIAGNOSIS — I456 Pre-excitation syndrome: Secondary | ICD-10-CM | POA: Diagnosis present

## 2021-11-30 DIAGNOSIS — K219 Gastro-esophageal reflux disease without esophagitis: Secondary | ICD-10-CM | POA: Diagnosis present

## 2021-11-30 DIAGNOSIS — G894 Chronic pain syndrome: Secondary | ICD-10-CM | POA: Diagnosis present

## 2021-11-30 DIAGNOSIS — F17210 Nicotine dependence, cigarettes, uncomplicated: Secondary | ICD-10-CM | POA: Diagnosis present

## 2021-11-30 DIAGNOSIS — J449 Chronic obstructive pulmonary disease, unspecified: Secondary | ICD-10-CM | POA: Diagnosis present

## 2021-11-30 DIAGNOSIS — E663 Overweight: Secondary | ICD-10-CM | POA: Diagnosis present

## 2021-11-30 DIAGNOSIS — F1910 Other psychoactive substance abuse, uncomplicated: Secondary | ICD-10-CM | POA: Diagnosis present

## 2021-11-30 DIAGNOSIS — Z79899 Other long term (current) drug therapy: Secondary | ICD-10-CM | POA: Diagnosis not present

## 2021-11-30 DIAGNOSIS — G629 Polyneuropathy, unspecified: Secondary | ICD-10-CM

## 2021-11-30 DIAGNOSIS — S0181XA Laceration without foreign body of other part of head, initial encounter: Secondary | ICD-10-CM | POA: Diagnosis present

## 2021-11-30 DIAGNOSIS — G40909 Epilepsy, unspecified, not intractable, without status epilepticus: Secondary | ICD-10-CM | POA: Diagnosis present

## 2021-11-30 DIAGNOSIS — S0121XA Laceration without foreign body of nose, initial encounter: Secondary | ICD-10-CM | POA: Diagnosis present

## 2021-11-30 DIAGNOSIS — R54 Age-related physical debility: Secondary | ICD-10-CM | POA: Diagnosis present

## 2021-11-30 DIAGNOSIS — Z885 Allergy status to narcotic agent status: Secondary | ICD-10-CM | POA: Diagnosis not present

## 2021-11-30 DIAGNOSIS — Z7982 Long term (current) use of aspirin: Secondary | ICD-10-CM | POA: Diagnosis not present

## 2021-11-30 DIAGNOSIS — F1210 Cannabis abuse, uncomplicated: Secondary | ICD-10-CM | POA: Diagnosis present

## 2021-11-30 DIAGNOSIS — Z9049 Acquired absence of other specified parts of digestive tract: Secondary | ICD-10-CM | POA: Diagnosis not present

## 2021-11-30 DIAGNOSIS — E785 Hyperlipidemia, unspecified: Secondary | ICD-10-CM | POA: Diagnosis present

## 2021-11-30 DIAGNOSIS — W19XXXA Unspecified fall, initial encounter: Secondary | ICD-10-CM | POA: Diagnosis present

## 2021-11-30 DIAGNOSIS — Z23 Encounter for immunization: Secondary | ICD-10-CM | POA: Diagnosis not present

## 2021-11-30 DIAGNOSIS — F32A Depression, unspecified: Secondary | ICD-10-CM | POA: Diagnosis present

## 2021-11-30 DIAGNOSIS — F1410 Cocaine abuse, uncomplicated: Secondary | ICD-10-CM | POA: Diagnosis present

## 2021-11-30 DIAGNOSIS — S0180XA Unspecified open wound of other part of head, initial encounter: Secondary | ICD-10-CM | POA: Diagnosis present

## 2021-11-30 LAB — BASIC METABOLIC PANEL
Anion gap: 7 (ref 5–15)
BUN: 15 mg/dL (ref 8–23)
CO2: 26 mmol/L (ref 22–32)
Calcium: 8.8 mg/dL — ABNORMAL LOW (ref 8.9–10.3)
Chloride: 105 mmol/L (ref 98–111)
Creatinine, Ser: 0.61 mg/dL (ref 0.44–1.00)
GFR, Estimated: >60 mL/min (ref >60)
Glucose, Bld: 82 mg/dL (ref 70–99)
Potassium: 3.6 mmol/L (ref 3.5–5.1)
Sodium: 138 mmol/L (ref 135–145)

## 2021-11-30 LAB — CBC
HCT: 34.3 % — ABNORMAL LOW (ref 36.0–46.0)
Hemoglobin: 12.1 g/dL (ref 12.0–15.0)
MCH: 31.4 pg (ref 26.0–34.0)
MCHC: 35.3 g/dL (ref 30.0–36.0)
MCV: 89.1 fL (ref 80.0–100.0)
Platelets: 355 10*3/uL (ref 150–400)
RBC: 3.85 MIL/uL — ABNORMAL LOW (ref 3.87–5.11)
RDW: 14.7 % (ref 11.5–15.5)
WBC: 7.3 10*3/uL (ref 4.0–10.5)
nRBC: 0 % (ref 0.0–0.2)

## 2021-11-30 LAB — VITAMIN B12: Vitamin B-12: 274 pg/mL (ref 180–914)

## 2021-11-30 LAB — URINE DRUG SCREEN, QUALITATIVE (ARMC ONLY)
Amphetamines, Ur Screen: NOT DETECTED
Barbiturates, Ur Screen: NOT DETECTED
Benzodiazepine, Ur Scrn: NOT DETECTED
Cannabinoid 50 Ng, Ur ~~LOC~~: NOT DETECTED
Cocaine Metabolite,Ur ~~LOC~~: POSITIVE — AB
MDMA (Ecstasy)Ur Screen: NOT DETECTED
Methadone Scn, Ur: NOT DETECTED
Opiate, Ur Screen: NOT DETECTED
Phencyclidine (PCP) Ur S: NOT DETECTED
Tricyclic, Ur Screen: POSITIVE — AB

## 2021-11-30 LAB — TSH: TSH: 1.764 u[IU]/mL (ref 0.350–4.500)

## 2021-11-30 LAB — HIV ANTIBODY (ROUTINE TESTING W REFLEX): HIV Screen 4th Generation wRfx: NONREACTIVE

## 2021-11-30 LAB — GLUCOSE, CAPILLARY: Glucose-Capillary: 134 mg/dL — ABNORMAL HIGH (ref 70–99)

## 2021-11-30 LAB — ETHANOL: Alcohol, Ethyl (B): <10 mg/dL (ref <10)

## 2021-11-30 MED ORDER — ALBUTEROL SULFATE (2.5 MG/3ML) 0.083% IN NEBU
2.5000 mg | INHALATION_SOLUTION | RESPIRATORY_TRACT | Status: DC | PRN
Start: 2021-11-30 — End: 2021-12-02

## 2021-11-30 MED ORDER — OXYCODONE HCL 5 MG PO TABS
5.0000 mg | ORAL_TABLET | Freq: Four times a day (QID) | ORAL | Status: DC | PRN
  Administered 2021-11-30: 5 mg via ORAL
  Filled 2021-11-30: qty 1

## 2021-11-30 MED ORDER — TIOTROPIUM BROMIDE MONOHYDRATE 18 MCG IN CAPS
18.0000 ug | ORAL_CAPSULE | Freq: Every day | RESPIRATORY_TRACT | Status: DC
  Administered 2021-12-01: 18 ug via RESPIRATORY_TRACT
  Filled 2021-11-30: qty 5

## 2021-11-30 MED ORDER — LEVETIRACETAM 500 MG PO TABS
500.0000 mg | ORAL_TABLET | Freq: Two times a day (BID) | ORAL | Status: DC
  Administered 2021-11-30: 500 mg via ORAL
  Filled 2021-11-30: qty 1

## 2021-11-30 MED ORDER — ADULT MULTIVITAMIN W/MINERALS CH
1.0000 | ORAL_TABLET | Freq: Every day | ORAL | Status: DC
  Administered 2021-11-30 – 2021-12-01 (×2): 1 via ORAL
  Filled 2021-11-30 (×2): qty 1

## 2021-11-30 MED ORDER — NICOTINE 14 MG/24HR TD PT24: 14.0000 mg | MEDICATED_PATCH | Freq: Every day | TRANSDERMAL | Status: DC | PRN

## 2021-11-30 MED ORDER — AMLODIPINE BESYLATE 5 MG PO TABS
5.0000 mg | ORAL_TABLET | Freq: Every day | ORAL | Status: DC
  Administered 2021-11-30 – 2021-12-01 (×2): 5 mg via ORAL
  Filled 2021-11-30 (×2): qty 1

## 2021-11-30 MED ORDER — PANTOPRAZOLE SODIUM 40 MG PO TBEC
40.0000 mg | DELAYED_RELEASE_TABLET | Freq: Every day | ORAL | Status: DC
Start: 2021-11-30 — End: 2021-12-02
  Administered 2021-11-30 – 2021-12-01 (×2): 40 mg via ORAL
  Filled 2021-11-30 (×2): qty 1

## 2021-11-30 MED ORDER — ENSURE ENLIVE PO LIQD
237.0000 mL | Freq: Two times a day (BID) | ORAL | Status: DC
  Administered 2021-11-30 – 2021-12-01 (×4): 237 mL via ORAL

## 2021-11-30 MED ORDER — KETOROLAC TROMETHAMINE 15 MG/ML IJ SOLN
15.0000 mg | Freq: Four times a day (QID) | INTRAMUSCULAR | Status: AC | PRN
  Administered 2021-11-30 (×3): 15 mg via INTRAVENOUS
  Filled 2021-11-30 (×4): qty 1

## 2021-11-30 MED ORDER — GABAPENTIN 400 MG PO CAPS
800.0000 mg | ORAL_CAPSULE | Freq: Three times a day (TID) | ORAL | Status: DC
  Administered 2021-11-30 – 2021-12-01 (×5): 800 mg via ORAL
  Filled 2021-11-30 (×6): qty 2

## 2021-11-30 MED ORDER — ALBUTEROL SULFATE HFA 108 (90 BASE) MCG/ACT IN AERS: 1.0000 | INHALATION_SPRAY | RESPIRATORY_TRACT | Status: DC | PRN

## 2021-11-30 MED ORDER — ASPIRIN 81 MG PO TBEC
81.0000 mg | DELAYED_RELEASE_TABLET | Freq: Every day | ORAL | Status: DC
  Administered 2021-11-30 – 2021-12-01 (×2): 81 mg via ORAL
  Filled 2021-11-30 (×2): qty 1

## 2021-11-30 MED ORDER — ZOLPIDEM TARTRATE 5 MG PO TABS: 5.0000 mg | ORAL_TABLET | Freq: Every evening | ORAL | Status: DC | PRN

## 2021-11-30 MED ORDER — CYCLOBENZAPRINE HCL 10 MG PO TABS
5.0000 mg | ORAL_TABLET | Freq: Three times a day (TID) | ORAL | Status: DC | PRN
  Administered 2021-11-30 – 2021-12-01 (×3): 5 mg via ORAL
  Filled 2021-11-30 (×3): qty 1

## 2021-11-30 MED ORDER — OXYCODONE HCL 5 MG PO TABS: 5.0000 mg | ORAL_TABLET | Freq: Once | ORAL | Status: DC

## 2021-11-30 NOTE — Progress Notes (Signed)
Initial Nutrition Assessment  DOCUMENTATION CODES:   Not applicable  INTERVENTION:   -Liberalize diet to regular for widest variety of meal selections -MVI with minerals daily -Ensure Enlive po BID, each supplement provides 350 kcal and 20 grams of protein  NUTRITION DIAGNOSIS:   Increased nutrient needs related to acute illness as evidenced by estimated needs.  GOAL:   Patient will meet greater than or equal to 90% of their needs  MONITOR:   PO intake, Supplement acceptance  REASON FOR ASSESSMENT:   Malnutrition Screening Tool    ASSESSMENT:   Pt with remote history of seizure in 2021, history of colovaginal fistula, history of intra-abdominal abscess, currently not on antiseizure medications, no follow-up with neurology due to transportation difficulty, remote history of Wolff-Parkinson-White syndrome status post ablation, hypertension, anxiety, hyperlipidemia, neuropathy, depression, GERD,who presents for chief concerns of seizures.  Pt admitted with seizures.   Reviewed I/O's: +100 ml x 24 hours  Pt unavailable at time of visit. Attempted to speak with pt via call to hospital room phone, however, unable to reach. RD unable to obtain further nutrition-related history or complete nutrition-focused physical exam at this time.    Pt currently on a heart healthy diet. Noted good meal intake; meal completions documented at 100%.   Reviewed wt hx; noted distant history of weight loss.   Medications reviewed and include keppra.   Labs reviewed: Mg: 1.6.    Diet Order:   Diet Order             Diet Heart Room service appropriate? Yes; Fluid consistency: Thin  Diet effective now                   EDUCATION NEEDS:   No education needs have been identified at this time  Skin:  Skin Assessment: Reviewed RN Assessment  Last BM:  /21/23  Height:   Ht Readings from Last 1 Encounters:  11/30/21 '5\' 2"'$  (1.575 m)    Weight:   Wt Readings from Last 1  Encounters:  11/30/21 63.1 kg    Ideal Body Weight:  50 kg  BMI:  Body mass index is 25.44 kg/m.  Estimated Nutritional Needs:   Kcal:  1500-1700  Protein:  85-100 grams  Fluid:  > 1.5 L    Loistine Chance, RD, LDN, [place] Registered Dietitian II Certified Diabetes Care and Education Specialist Please refer to [facility] for RD and/or RD on-call/weekend/after hours pager

## 2021-11-30 NOTE — Progress Notes (Signed)
[facility] Progress Note  Patient: Alison Walker    PJK:[id number]  DOA: 11/29/2021    Date of Service: the patient was seen and examined on 11/30/2021  Brief hospital course: 61 year old female with remote history of Wolff-Parkinson-White syndrome status post ablation as well as history of colovaginal fistula, history of intra-abdominal abscess in 2021, and history of seizure in 2021, currently not on antiseizure medications, no follow-up with neurology due to transportation difficulty, brought by EMS to the emergency room on 5/20 after having witnessed tonic-clonic seizure.  Patient given Keppra in the emergency room.  Admitted to the hospitalist service and started on daily Keppra.  Neurology consulted.  Assessment and Plan: Assessment and Plan: * Seizure [facility]) Fall precautions and seizure precautions.  Alcohol, B12, TSH all within normal limits.  Urine drug screen positive for cocaine and THC.  Currently on Keppra twice daily.  Seen by neurology.  Overnight EEG.  As needed Ativan.   Polysubstance abuse ([place]) Urine drug screen positive for THC and cocaine.  Polypharmacy - Home medications include: Gabapentin 800 mg p.o. 3 times daily, Flexeril 5 mg 3 times daily as needed for muscle spasms, hydroxyzine 25 mg every 8 hours  Open facial wound, initial encounter - Present on admission - Presumed secondary to falling on her face and hitting the rails at home - Skin laceration at the bridge of her nose and forehead  Neuropathy - Patient takes gabapentin 800 mg 3 times daily, although according to patient, she was recently increased to 4 times a day although I have not yet verified this in her chart.  We will call her primary care office in the morning.  Anxiety state - Home medications include alprazolam 0.5 mg nightly as needed for anxiety, gabapentin 800 mg 3 times daily, duloxetine 20 mg daily  Essential hypertension - Amlodipine 5 mg daily resumed for 11/30/2021 at 10 AM  WPW  (Wolff-Parkinson-White syndrome) - Remote history, status post ablation  Tobacco use disorder - Nicotine patch as needed for nicotine craving ordered  Overweight (BMI 25.0-29.9) Patient with BMI greater than 25       Body mass index is 25.44 kg/m.  Nutrition Problem: Increased nutrient needs Etiology: acute illness     Consultants: Neurology  Procedures: EEG pending  Antimicrobials: None  Code Status: Full code   Subjective: Patient complains of pain where she had hit her nose and face  Objective: Vital signs were reviewed and unremarkable. Vitals:   11/30/21 0818 11/30/21 1127  BP: (!) 114/56 (!) 94/57  Pulse: 86 82  Resp: 18 18  Temp: 97.8 F (36.6 C) 98.2 F (36.8 C)  SpO2: 100% 98%    Intake/Output Summary (Last 24 hours) at 11/30/2021 1251 Last data filed at 11/30/2021 0900 Gross per 24 hour  Intake 340 ml  Output --  Net 340 ml   Filed Weights   11/29/21 1759 11/30/21 0054  Weight: 62.6 kg 63.1 kg   Body mass index is 25.44 kg/m.  Exam:  General: Alert and oriented x3, mild distress secondary to pain HEENT: Normocephalic.  Superficial bruising and abrasions of the face on nose and forehead between eyebrows Cardiovascular: Regular rate and rhythm, S1-S2 Respiratory: Clear to auscultation bilaterally Abdomen: Soft, nontender, nondistended, positive bowel sounds Musculoskeletal: No clubbing or cyanosis or edema Skin: Abrasions as described above on her face, otherwise no skin breaks, tears or lesions Psychiatry: Appropriate, no evidence of psychoses Neurology: No focal deficits.  Currently no seizure activity.  Data Reviewed: Noted normal  labs.  Positive urine drug screen for cocaine and THC  Disposition:  Status is: Observation     Anticipated discharge date: 5/22  Remaining issues to be resolved so that patient can be discharged: Overnight EEG   Family Communication: Declined for me to call family DVT Prophylaxis: enoxaparin  (LOVENOX) injection 40 mg Start: 11/30/21 0800 Place TED hose Start: 11/29/21 2335    Author: Annita Brod ,MD 11/30/2021 12:51 PM  To reach On-call, see care teams to locate the attending and reach out via [email or web address]. Between 7PM-7AM, please contact night-coverage If you still have difficulty reaching the attending provider, please page the [facility] (Director on Call) for [facility] on amion for assistance.

## 2021-11-30 NOTE — Assessment & Plan Note (Addendum)
Fall precautions and seizure precautions.  Alcohol, B12, TSH all within normal limits.  Urine drug screen positive for cocaine and THC.  Currently on Keppra twice daily.  Seen by neurology.  EEG on 5/22 unremarkable.  Cause felt to likely be secondary to drug use.  Patient counseled for this.  As per neurology, patient does not need to be discharged on Keppra or other antiepileptic.

## 2021-11-30 NOTE — Consult Note (Signed)
Neurology Consultation Reason for Consult: Seizure Referring Physician: Lyman Speller  CC: Seizure  History is obtained from:patient  HPI: Alison Walker is a 62 y.o. female with a history of a single previous seizure who was in her normal state of health yesterday.  She then had an episode of loss of consciousness with fall and hit her head.  She does not remember anything about the fall or hitting her head.  She reportedly had seizure-like activity, though when attempting to call for ancillary support, was unable to get a hold of her family member.  She was found to be somnolent, and presumed postictal yesterday in the emergency department.  She was therefore admitted for observation.  She states that she is much better than she was, but still has significant difficulty with getting her words out which is unusual for her.  She also complains of significant pain where she hit her head including some throbbing, as well as back pain radiating into her right leg.   Past Medical History:  Diagnosis Date   Diverticulitis    Hypertension    S/P ablation operation for arrhythmia    for WPW   Wolf-Parkinson-White syndrome      Family History  Problem Relation Age of Onset   Cancer Mother    Cancer Other      Social History:  reports that she has been smoking cigarettes. She has been smoking an average of .5 packs per day. She has never used smokeless tobacco. She reports that she does not drink alcohol and does not use drugs.   Exam: Current vital signs: BP (!) 94/57   Pulse 82   Temp 98.2 F (36.8 C) (Oral)   Resp 18   Ht '5\' 2"'$  (1.575 m)   Wt 63.1 kg   SpO2 98%   BMI 25.44 kg/m  Vital signs in last 24 hours: Temp:  [97.8 F (36.6 C)-98.5 F (36.9 C)] 98.2 F (36.8 C) (05/21 1127) Pulse Rate:  [73-100] 82 (05/21 1127) Resp:  [12-21] 18 (05/21 1127) BP: (94-133)/(51-86) 94/57 (05/21 1127) SpO2:  [97 %-100 %] 98 % (05/21 1127) Weight:  [62.6 kg-63.1 kg] 63.1 kg (05/21  0054)   Physical Exam  Constitutional: Appears well-developed and well-nourished.  Psych: Affect appropriate to situation Eyes: No scleral injection HENT: No OP obstruction MSK: no joint deformities.  Cardiovascular: Normal rate and regular rhythm.  Respiratory: Effort normal, non-labored breathing GI: Soft.  No distension. There is no tenderness.  Skin: WDI  Neuro: Mental Status: Patient is awake, alert, oriented to person, place, month, year, and situation. Patient is able to give a clear and coherent history. She has significant latency of speech, though when asked to name she does so extremely quickly.  She also has no difficulty with number of quarters in $2.75 Cranial Nerves: II: Visual Fields are full. Pupils are equal, round, and reactive to light.   III,IV, VI: EOMI without ptosis or diploplia.  V: Facial sensation is symmetric to temperature VII: Facial movement is symmetric.  VIII: hearing is intact to voice X: Uvula elevates symmetrically XI: Shoulder shrug is symmetric. XII: tongue is midline without atrophy or fasciculations.  Motor: Tone is normal. Bulk is normal. 5/5 strength was present in all four extremities.  Sensory: Sensation is symmetric to light touch and temperature in the arms and legs. Cerebellar: No ataxia on finger-nose-finger   I have reviewed labs in epic and the results pertinent to this consultation are: TSH 1.76  I have  reviewed the images obtained: MRI-negative  Impression: 61 year old female with a second episode concerning for seizure.  I think that seizure is a possibility, but without further history syncope with peri-concussive amnesia would also be a possibility.  She is already on an antiepileptic (gabapentin) and I would continue this, but would be hesitant to add anything additional at this time.  I do think an EEG would be reasonable, and if this showed significant activity, then could add a second  antiepileptic.  Recommendations: 1) continue gabapentin 800 3 times daily 2) Reglan 10 mg IV x1 for headache 3) EEG 4) neurology will follow   Roland Rack, MD [facility] [phone]  If 7pm- 7am, please page neurology on call as listed in [place].

## 2021-11-30 NOTE — Assessment & Plan Note (Addendum)
-   Home medications include: Gabapentin 800 mg p.o. 3 times daily, Flexeril 5 mg 3 times daily as needed for muscle spasms, hydroxyzine 25 mg every 8 hours.  Cleared to resume Neurontin.

## 2021-11-30 NOTE — Assessment & Plan Note (Signed)
-   Present on admission - Presumed secondary to falling on her face and hitting the rails at home - Skin laceration at the bridge of her nose and forehead

## 2021-11-30 NOTE — Hospital Course (Signed)
Ms. Alison Walker is a 61 year old anxiety, hypertension, hyperlipidemia, GERD, remote history of Wolff-Parkinson-White syndrome status post ablation, remote history of seizure in 2021, was never followed by neurology due to difficulty with transportation, who presents emergency department from home via EMS for chief concerns of seizures.  Initial vitals in the ED show temperature of 98.5, respiration rate of 18, heart rate of 100, blood pressure 117/51, SPO2 of 97% on room air.  Serum sodium is 135, potassium 3.7, chloride 106, bicarb 23, BUN of 20, serum creatinine of 0.75, GFR greater than 60, nonfasting blood glucose 140, WBC 8.1, hemoglobin 12, platelets of 362.  UA was negative for leukocytes and nitrates.  ED treatment: Tylenol 1 g, gabapentin 800 mg, lidocaine patch, Keppra 1 g IV.

## 2021-11-30 NOTE — H&P (Signed)
History and Physical   Alison Walker PXT:[id number] DOB: 10/24/1960 DOA: 11/29/2021  PCP: [facility]  Patient coming from: home via EMS  I have personally briefly reviewed patient's old medical records in [place].  Chief Concern: Seizures  HPI: Ms. Alison Walker is a 61 year old female with remote history of seizure in 2021, history of colovaginal fistula, history of intra-abdominal abscess in 2021, currently not on antiseizure medications, no follow-up with neurology due to transportation difficulty, remote history of Wolff-Parkinson-White syndrome status post ablation, hypertension, anxiety, hyperlipidemia, neuropathy, depression, GERD,who presents emergency department from home via EMS for chief concerns of seizures.  Initial vitals in the emergency department showed temperature of 98.5, respiratory rate of 18, heart rate of 100, blood pressure 117/51, SPO2 of 97% on room air.  Serum sodium is 135, potassium 3.7, chloride of 106, bicarb 23, BUN of 20, sCr of 0.75, GFR greater than 60, nonfasting blood glucose 160, WBC 8.1, hemoglobin 12, platelets of 362.  ED treatment: Tylenol 1 g, gabapentin 800 mg, lidocaine patch, Keppra 1 g IV.  At bedside patient is able to tell me her name, her age, she knows she is in the hospital.  She knows the current calendar year.  She reports that she was in the porch when the She knew she woke up she was on the floor and EMS was called.  She dysuria, hematuria, diarrhea.  She endorses 1 episode of loose stool on 11/28/2021.  She reports the stool was brown.  She denies any dysphagia, new cough, fever at home.  She endorses generalized pain.  Social history: She lives at home with her niece. She denies EtOH and recreational drug use. She is a current tobacco user, 1 pack will last her 3 to 4 days. She is retired and formerly worked Sports administrator individuals with disabilities.  ROS: Constitutional: no weight change, no  fever ENT/Mouth: no sore throat, no rhinorrhea Eyes: no eye pain, no vision changes Cardiovascular: no chest pain, no dyspnea,  no edema, no palpitations Respiratory: no cough, no sputum, no wheezing Gastrointestinal: no nausea, no vomiting, no diarrhea, no constipation Genitourinary: no urinary incontinence, no dysuria, no hematuria Musculoskeletal: no arthralgias, no myalgias Skin: no skin lesions, no pruritus, Neuro: + weakness, + loss of consciousness, no syncope Psych: no anxiety, no depression, no decrease appetite Heme/Lymph: no bruising, no bleeding  ED Course: Discussed with emergency medicine provider, patient requiring hospitalization for chief concerns of seizure.  Assessment/Plan  Principal Problem:   Seizure ([place]) Active Problems:   Anxiety state   Chronic pain syndrome   Heart disease   Tobacco use disorder   WPW (Wolff-Parkinson-White syndrome)   Polypharmacy   Open facial wound, initial encounter   Essential hypertension   Neuropathy   Assessment and Plan:  * Seizure ([place]) - Etiology work-up in progress, query polypharmacy - Fall precautions, seizure precautions - Check ethanol level, B12, TSH, UDS - Lorazepam 2 mg IV every 6 hours as needed for seizures and anxiety, 2 doses ordered - Neurology service, to Dr. Leonel Ramsay, has been consulted via epic order and staff message - Pending neurology evaluation, I have ordered Keppra immediate release, 500 mg p.o. twice daily, 1 day - Admit to telemetry medical, observation  Neuropathy - Patient takes gabapentin 800 mg 3 times daily, this is a very high dose - Patient is status post 800 mg of gabapentin per EDP - I have not resumed these medications as patient is somnolent and falling in and out  of the sleep during my evaluation - Patient would benefit from reevaluation of patient's medication outpatient  Essential hypertension - Amlodipine 5 mg daily resumed for 11/30/2021 at 10 AM  Open facial wound,  initial encounter - Present on admission - Presumed secondary to falling on her face and hitting the rails at home - Skin laceration at the bridge of her nose and forehead  Polypharmacy - Home medications include: Gabapentin 800 mg p.o. 3 times daily, Flexeril 5 mg 3 times daily as needed for muscle spasms, hydroxyzine 25 mg every 8 hours  WPW (Wolff-Parkinson-White syndrome) - Remote history, status post ablation  Tobacco use disorder - Nicotine patch as needed for nicotine craving ordered  Anxiety state - Home medications include alprazolam 0.5 mg nightly as needed for anxiety, gabapentin 800 mg 3 times daily, duloxetine 20 mg daily - I have not resumed the above due to patient's somnolent and arousable at bedside - Ativan 2 mg IV every 6 hours as needed for seizure and anxiety, 2 doses ordered - AM team to resume home medication as appropriate  Per med reconciliation, patient does not know when the last time any of her medications were taken at home.  Chart reviewed.   DVT prophylaxis: Enoxaparin Code Status: Full code Diet: Heart healthy Family Communication: None.  I attempted to call son at [phone], operator states that the phone number is no longer in service.  I then attempted to call [phone], no pickup Disposition Plan: Pending clinical course Consults called: Neurology Admission status: Telemetry medical, observation  Past Medical History:  Diagnosis Date   Diverticulitis    Hypertension    S/P ablation operation for arrhythmia    for WPW   Wolf-Parkinson-White syndrome    Past Surgical History:  Procedure Laterality Date   ABDOMINAL SURGERY     CHOLECYSTECTOMY     COLONOSCOPY     COLOSTOMY     COLOSTOMY REVISION     Social History:  reports that she has been smoking cigarettes. She has been smoking an average of .5 packs per day. She has never used smokeless tobacco. She reports that she does not drink alcohol and does not use drugs.  Allergies   Allergen Reactions   Ciprofloxacin Rash   Morphine Rash   Family History  Problem Relation Age of Onset   Cancer Mother    Cancer Other    Family history: Family history reviewed and not pertinent  Prior to Admission medications   Medication Sig Start Date End Date Taking? Authorizing Provider  acetaminophen (TYLENOL) 500 MG tablet Take by mouth.    [provider]  acetaminophen-codeine (TYLENOL #3) 300-30 MG tablet Take 1 tablet by mouth every 4 (four) hours as needed. 12/01/19   [provider]  albuterol (VENTOLIN HFA) 108 (90 Base) MCG/ACT inhaler Inhale into the lungs.    [provider]  ALPRAZolam Duanne Moron) 0.5 MG tablet Take 0.5 mg by mouth at bedtime as needed for anxiety.    [provider]  amLODipine (NORVASC) 5 MG tablet Take by mouth.    [provider]  aspirin EC 81 MG tablet Take 81 mg by mouth.    [provider]  atenolol (TENORMIN) 50 MG tablet Take 50 mg by mouth. 10/26/13   [provider]  atorvastatin (LIPITOR) 40 MG tablet Take 40 mg by mouth daily.    [provider]  clotrimazole-betamethasone (LOTRISONE) cream Apply 1 application. topically 2 (two) times daily. 11/21/21   Edrick Kins,  DPM  cyclobenzaprine (FLEXERIL) 5 MG tablet Take by mouth.    [provider]  desipramine (NORPRAMIN) 50 MG tablet Take 50 mg by mouth. 09/14/13   [provider]  diclofenac Sodium (VOLTAREN) 1 % GEL Apply topically.    [provider]  dicyclomine (BENTYL) 20 MG tablet Take 20 mg by mouth every 6 (six) hours.    [provider]  Docusate Sodium (DSS) 100 MG CAPS Take by mouth.    [provider]  doxycycline (VIBRA-TABS) 100 MG tablet Take 1 tablet (100 mg total) by mouth 2 (two) times daily. 09/27/20   Edrick Kins, DPM  DULoxetine (CYMBALTA) 20 MG capsule Take by mouth.    [provider]  DULoxetine HCl 40 MG CPEP Take 1 capsule by mouth daily.  02/19/20   [provider]  escitalopram (LEXAPRO) 20 MG tablet Take 20 mg by mouth daily.    [provider]  gabapentin (NEURONTIN) 800 MG tablet Take by mouth.    [provider]  gentamicin cream (GARAMYCIN) 0.1 % Apply 1 application topically 2 (two) times daily. 08/27/20   Edrick Kins, DPM  ketoconazole (NIZORAL) 2 % cream Apply 1 application topically daily. 06/10/20   McDonald, Stephan Minister, DPM  lidocaine (XYLOCAINE) 5 % ointment Apply topically.    [provider]  lidocaine-prilocaine (EMLA) cream APPLY 1 APPLICATION TOPICALLY TO AFFECTED AREA AS NEEDED 06/30/20   McDonald, Stephan Minister, DPM  lisinopril (PRINIVIL,ZESTRIL) 10 MG tablet Take 10 mg by mouth. 10/26/13   [provider]  meloxicam (MOBIC) 15 MG tablet TAKE 1 TABLET BY MOUTH DAILY 06/30/20   Edrick Kins, DPM  methocarbamol (ROBAXIN) 750 MG tablet Take 1 tablet (750 mg total) by mouth 4 (four) times daily. 02/25/16   Beers, Pierce Crane, PA-C  methylPREDNISolone (MEDROL DOSEPAK) 4 MG TBPK tablet 6 day dose pack - take as directed 06/10/20   Criselda Peaches, DPM  omeprazole (PRILOSEC) 40 MG capsule Take 40 mg by mouth daily.    [provider]  oxyCODONE-acetaminophen (PERCOCET/ROXICET) 5-325 MG tablet TAKE 1 TABLET BY MOUTH EVERY 8 HOURS AS NEEDED FOR SEVERE PAIN 01/24/21   Edrick Kins, DPM  pantoprazole (PROTONIX) 40 MG tablet Take 1 tablet (40 mg total) by mouth daily. 01/04/18 01/04/19  Earleen Newport, MD  Sennosides (SENNA) 8.6 MG CAPS Take by mouth. 10/26/13   [provider]  sucralfate (CARAFATE) 1 g tablet Take 1 tablet (1 g total) by mouth 4 (four) times daily. 01/04/18 01/04/19  Earleen Newport, MD  terbinafine (LAMISIL) 250 MG tablet Take 250 mg by mouth daily.    [provider]  tiotropium (SPIRIVA) 18 MCG inhalation capsule Place into inhaler and inhale.    [provider]   Physical Exam: Vitals:   11/29/21 2130 11/29/21 2200 11/29/21  2308 11/29/21 2330  BP: 118/71 133/86 113/78 101/65  Pulse: 73 79 75 77  Resp: '12 20 12 19  '$ Temp:      TempSrc:      SpO2: 99% 98% 98% 99%  Weight:      Height:       Constitutional: appears older than chronological age, frail, NAD, calm, comfortable Eyes: PERRL, lids and conjunctivae normal ENMT: Mucous membranes are moist. Posterior pharynx clear of any exudate or lesions. Age-appropriate dentition. Hearing appropriate Neck: normal, supple, no masses, no thyromegaly Respiratory: clear to auscultation bilaterally, no wheezing, no crackles. Normal respiratory effort. No accessory muscle use.  Cardiovascular:  Regular rate and rhythm, no murmurs / rubs / gallops. No extremity edema. 2+ pedal pulses. No carotid bruits.  Abdomen: no tenderness, no masses palpated, no hepatosplenomegaly. Bowel sounds positive.  Musculoskeletal: no clubbing / cyanosis. No joint deformity upper and lower extremities. Good ROM, no contractures, no atrophy. Normal muscle tone.  Skin: Left-sided forehead skin laceration, bridge of nose skin laceration Neurologic: Sensation intact. Strength 5/5 in all 4.  Psychiatric: Normal judgment and insight. Alert and oriented x 3. Normal mood.   EKG: independently reviewed, showing sinus rhythm with rate of 96,QTc 457  X-ray on Admission: I personally reviewed and I agree with radiologist reading as below.  CT HEAD WO CONTRAST (5MM)  Result Date: 11/29/2021 CLINICAL DATA:  Seizure disorder, clinical change Seizure striking head on railing of porch.  With pain. EXAM: CT HEAD WITHOUT CONTRAST TECHNIQUE: Contiguous axial images were obtained from the base of the skull through the vertex without intravenous contrast. RADIATION DOSE REDUCTION: This exam was performed according to the departmental dose-optimization program which includes automated exposure control, adjustment of the mA and/or kV according to patient size and/or use of iterative reconstruction technique. COMPARISON:   Remote head CT 03/11/2011 FINDINGS: Brain: Generalized atrophy. No intracranial hemorrhage, mass effect, or midline shift. No hydrocephalus. The basilar cisterns are patent. There is mild periventricular chronic small vessel ischemic change. No evidence of territorial infarct or acute ischemia. No extra-axial or intracranial fluid collection. Vascular: No hyperdense vessel or unexpected calcification. Skull: No fracture or focal lesion. Sinuses/Orbits: Assessed on concurrent face CT, reported separately. Other: None. IMPRESSION: 1. No acute intracranial abnormality. No skull fracture. 2. Generalized atrophy and mild chronic small vessel ischemic change. Electronically Signed   By: Keith Rake M.D.   On: 11/29/2021 18:47   CT Cervical Spine Wo Contrast  Result Date: 11/29/2021 CLINICAL DATA:  Polytrauma, blunt EXAM: CT CERVICAL SPINE WITHOUT CONTRAST TECHNIQUE: Multidetector CT imaging of the cervical spine was performed without intravenous contrast. Multiplanar CT image reconstructions were also generated. RADIATION DOSE REDUCTION: This exam was performed according to the departmental dose-optimization program which includes automated exposure control, adjustment of the mA and/or kV according to patient size and/or use of iterative reconstruction technique. COMPARISON:  None Available. FINDINGS: Alignment: Normal. Skull base and vertebrae: No acute fracture. Vertebral body heights are maintained. The dens and skull base are intact. Soft tissues and spinal canal: No prevertebral fluid or swelling. No visible canal hematoma. Disc levels: Degenerative disc disease from C4-C5 through C6-C7 with disc space narrowing and spurring. There is mild multilevel facet hypertrophy. Upper chest: Right upper lobe pulmonary nodule measures 10 x 4 mm (7 mm mean diameter), only partially included in the field of view, series 3, image 86. this is new from 03/15/2011 chest CT. Other: Carotid calcifications. IMPRESSION: 1. No  acute fracture or subluxation of the cervical spine. 2. Mild degenerative disc disease and facet hypertrophy. 3. Right upper lobe pulmonary nodule measures 10 x 4 mm (7 mm mean diameter). This is new from 03/15/2011 chest CT. Follow-up recommendations for nodule of this size are as follows: Non-contrast chest CT at 6-12 months is recommended. If the nodule is stable at time of repeat CT, then future CT at 18-24 months (from today's scan) is considered optional for low-risk patients, but is recommended for high-risk patients. This recommendation follows the consensus statement: Guidelines for Management of Incidental Pulmonary Nodules Detected on CT Images: From the Fleischner Society 2017; Radiology 2017; 284:228-243. Electronically Signed   By: Threasa Beards  Sanford M.D.   On: 11/29/2021 18:54   CT Lumbar Spine Wo Contrast  Result Date: 11/29/2021 CLINICAL DATA:  Ataxia and lumbar trauma EXAM: CT LUMBAR SPINE WITHOUT CONTRAST TECHNIQUE: Multidetector CT imaging of the lumbar spine was performed without intravenous contrast administration. Multiplanar CT image reconstructions were also generated. RADIATION DOSE REDUCTION: This exam was performed according to the departmental dose-optimization program which includes automated exposure control, adjustment of the mA and/or kV according to patient size and/or use of iterative reconstruction technique. COMPARISON:  12/14/2010 FINDINGS: Segmentation: 5 lumbar type vertebrae. Alignment: Normal. Vertebrae: Superior endplate Schmorl's node at T12. No acute abnormality. Chronic degenerative remodeling at L4-5. Paraspinal and other soft tissues: Calcific aortic atherosclerosis. Disc levels: No spinal canal stenosis. IMPRESSION: 1. No acute fracture or static subluxation of the lumbar spine. 2. Aortic Atherosclerosis (ICD10-I70.0). Electronically Signed   By: Ulyses Jarred M.D.   On: 11/29/2021 21:45   MR Brain W and Wo Contrast  Result Date: 11/29/2021 CLINICAL DATA:   Seizure EXAM: MRI HEAD WITHOUT AND WITH CONTRAST TECHNIQUE: Multiplanar, multiecho pulse sequences of the brain and surrounding structures were obtained without and with intravenous contrast. CONTRAST:  91m GADAVIST GADOBUTROL 1 MMOL/ML IV SOLN COMPARISON:  None Available. FINDINGS: Brain: No acute infarct, mass effect or extra-axial collection. No acute or chronic hemorrhage. Normal white matter signal. Generalized volume loss. The midline structures are normal. Vascular: Major flow voids are preserved. Skull and upper cervical spine: Normal calvarium and skull base. Visualized upper cervical spine and soft tissues are normal. Sinuses/Orbits:No paranasal sinus fluid levels or advanced mucosal thickening. No mastoid or middle ear effusion. Normal orbits. IMPRESSION: 1. No acute intracranial abnormality. 2. Generalized volume loss. Electronically Signed   By: KUlyses JarredM.D.   On: 11/29/2021 23:15   DG Knee Complete 4 Views Right  Result Date: 11/29/2021 CLINICAL DATA:  Fall, right knee pain EXAM: RIGHT KNEE - COMPLETE 4+ VIEW COMPARISON:  11/25/2018 FINDINGS: No evidence of fracture, dislocation, or joint effusion. Similar mild tricompartmental degenerative changes. Mild soft tissue swelling at the medial aspect of the knee. IMPRESSION: 1. Mild soft tissue swelling at the medial aspect of the knee. No acute fracture or effusion. 2. Similar mild tricompartmental degenerative changes. Electronically Signed   By: NDavina PokeD.O.   On: 11/29/2021 21:24   DG Foot Complete Left  Result Date: 11/29/2021 CLINICAL DATA:  Fall and trauma to the left foot. EXAM: LEFT FOOT - COMPLETE 3+ VIEW COMPARISON:  None Available. FINDINGS: There is no acute fracture or dislocation. The bones are osteopenic. No significant arthritic changes. The soft tissues are unremarkable. IMPRESSION: Negative. Electronically Signed   By: AAnner CreteM.D.   On: 11/29/2021 21:18   DG Foot Complete Right  Result Date:  11/29/2021 CLINICAL DATA:  Fall, right foot pain EXAM: RIGHT FOOT COMPLETE - 3+ VIEW COMPARISON:  06/03/2021 FINDINGS: No evidence of acute fracture. No dislocation. Chronic severe first MTP joint arthropathy. Joint appears partially ankylosed. Chronic ankylosis of the second toe PIP joint. Small plantar calcaneal spur. No focal soft tissue swelling. IMPRESSION: 1. No evidence of acute fracture or dislocation. 2. Chronic severe first MTP joint arthropathy. Electronically Signed   By: NDavina PokeD.O.   On: 11/29/2021 21:23   CT Maxillofacial Wo Contrast  Result Date: 11/29/2021 CLINICAL DATA:  Facial trauma, blunt EXAM: CT MAXILLOFACIAL WITHOUT CONTRAST TECHNIQUE: Multidetector CT imaging of the maxillofacial structures was performed. Multiplanar CT image reconstructions were also generated. RADIATION DOSE REDUCTION: This exam  was performed according to the departmental dose-optimization program which includes automated exposure control, adjustment of the mA and/or kV according to patient size and/or use of iterative reconstruction technique. COMPARISON:  None Available. FINDINGS: Osseous: Mild motion artifact limitations. Allowing for this, no evidence of acute fracture of the nasal bone, zygomatic arches, or mandibles. The temporomandibular joints are congruent. Edentulous of upper teeth. Mild leftward nasal septal deviation. Orbits: No orbital fracture.  No globe injury. Sinuses: No sinus fracture or hemosinus. The paranasal sinuses are clear. There is no mastoid effusion. The right mastoid air cells are hypo pneumatized, chronic. Soft tissues: No confluent hematoma. Limited intracranial: Assessed on concurrent head CT, reported separately. IMPRESSION: No acute facial bone fracture, allowing for mild motion artifact limitations. Electronically Signed   By: Keith Rake M.D.   On: 11/29/2021 18:50    Labs on Admission: I have personally reviewed following labs  CBC: Recent Labs  Lab  11/29/21 1804  WBC 8.1  HGB 12.0  HCT 36.6  MCV 89.5  PLT 545   Basic Metabolic Panel: Recent Labs  Lab 11/29/21 1804  NA 135  K 3.7  CL 106  CO2 23  GLUCOSE 140*  BUN 20  CREATININE 0.75  CALCIUM 9.0   GFR: Estimated Creatinine Clearance: 63.4 mL/min (by C-G formula based on SCr of 0.75 mg/dL).  Liver Function Tests: Recent Labs  Lab 11/29/21 1804  AST 22  ALT 13  ALKPHOS 67  BILITOT 0.7  PROT 6.9  ALBUMIN 4.0   Urine analysis:    Component Value Date/Time   COLORURINE YELLOW (A) 11/29/2021 2132   APPEARANCEUR HAZY (A) 11/29/2021 2132   APPEARANCEUR Clear 04/13/2014 0330   LABSPEC 1.017 11/29/2021 2132   LABSPEC 1.004 04/13/2014 0330   PHURINE 5.0 11/29/2021 2132   GLUCOSEU NEGATIVE 11/29/2021 2132   GLUCOSEU Negative 04/13/2014 0330   HGBUR NEGATIVE 11/29/2021 2132   BILIRUBINUR NEGATIVE 11/29/2021 2132   BILIRUBINUR Negative 04/13/2014 0330   KETONESUR NEGATIVE 11/29/2021 2132   PROTEINUR NEGATIVE 11/29/2021 2132   UROBILINOGEN 1.0 03/12/2011 0912   NITRITE NEGATIVE 11/29/2021 2132   LEUKOCYTESUR MODERATE (A) 11/29/2021 2132   LEUKOCYTESUR Negative 04/13/2014 0330   Dr. Tobie Poet [facility]  If 7PM-7AM, please contact overnight-coverage provider If 7AM-7PM, please contact day coverage provider [email or web address]  11/30/2021, 12:35 AM

## 2021-11-30 NOTE — Assessment & Plan Note (Signed)
-   Remote history, status post ablation

## 2021-11-30 NOTE — Assessment & Plan Note (Addendum)
-   Home medications include alprazolam 0.5 mg nightly as needed for anxiety, gabapentin 800 mg 3 times daily, duloxetine 20 mg daily

## 2021-11-30 NOTE — Assessment & Plan Note (Signed)
Patient with BMI greater than 25

## 2021-11-30 NOTE — Assessment & Plan Note (Addendum)
Urine drug screen positive for TCA and cocaine.  Counseled.  At first, patient denies that she has a drug problem.  Then becomes tearful and admits that she needs to make better decisions.

## 2021-11-30 NOTE — Assessment & Plan Note (Signed)
-   Amlodipine 5 mg daily resumed for 11/30/2021 at 10 AM

## 2021-11-30 NOTE — Assessment & Plan Note (Signed)
-   Nicotine patch as needed for nicotine craving ordered ?

## 2021-11-30 NOTE — Assessment & Plan Note (Addendum)
-   Patient takes gabapentin 800 mg 3 times daily, although according to patient, she was recently increased to 4 times a day although not yet verified in chart.

## 2021-12-01 DIAGNOSIS — Z79899 Other long term (current) drug therapy: Secondary | ICD-10-CM | POA: Diagnosis not present

## 2021-12-01 DIAGNOSIS — E663 Overweight: Secondary | ICD-10-CM | POA: Diagnosis not present

## 2021-12-01 DIAGNOSIS — F1910 Other psychoactive substance abuse, uncomplicated: Secondary | ICD-10-CM | POA: Diagnosis not present

## 2021-12-01 DIAGNOSIS — I10 Essential (primary) hypertension: Secondary | ICD-10-CM | POA: Diagnosis not present

## 2021-12-01 DIAGNOSIS — R569 Unspecified convulsions: Secondary | ICD-10-CM | POA: Diagnosis not present

## 2021-12-01 MED ORDER — NICOTINE 14 MG/24HR TD PT24
14.0000 mg | MEDICATED_PATCH | Freq: Every day | TRANSDERMAL | 0 refills | Status: DC | PRN
Start: 2021-12-01 — End: 2022-12-19

## 2021-12-01 NOTE — Procedures (Signed)
Routine EEG Report  Alison Walker is a 61 y.o. female with a history of seizure who is undergoing an EEG to evaluate for seizures.  Report: This EEG was acquired with electrodes placed according to the International 10-20 electrode system (including Fp1, Fp2, F3, F4, C3, C4, P3, P4, O1, O2, T3, T4, T5, T6, A1, A2, Fz, Cz, Pz). The following electrodes were missing or displaced: none.  The occipital dominant rhythm was 8.5 Hz with intermittent mild diffuse slowing. This activity is reactive to stimulation. Drowsiness was manifested by background fragmentation; deeper stages of sleep were identified by K complexes and sleep spindles. There was no focal slowing. There were no interictal epileptiform discharges. There were no electrographic seizures identified. There was no abnormal response to photic stimulation. Hyperventilation was not performed.   Impression and clinical correlation: This EEG was obtained while awake and asleep and is abnormal due to intermittent mild diffuse slowing indicative of global cerebral dysfunction. Epileptiform abnormalities were not seen during this recording.  Su Monks, MD [facility] [phone]  If 7pm- 7am, please page neurology on call as listed in [place].

## 2021-12-01 NOTE — Discharge Summary (Signed)
Physician Discharge Summary   Patient: Alison Walker MRN: [id number] DOB: August 04, 1960  Admit date:     11/29/2021  Discharge date: 12/01/21  Discharge Physician: Annita Brod   PCP: [facility]   Recommendations at discharge:   Patient cleared to discharge home.  Information given for drug counseling.  Discharge Diagnoses: Principal Problem:   Seizure [facility]) Active Problems:   Polypharmacy   Polysubstance abuse ([place])   Open facial wound, initial encounter   Anxiety state   Neuropathy   Chronic pain syndrome   Essential hypertension   WPW (Wolff-Parkinson-White syndrome)   Tobacco use disorder   Overweight (BMI 25.0-29.9)   COPD (chronic obstructive pulmonary disease) (HCC)  Resolved Problems:   * No resolved hospital problems. *  Hospital Course: 61 year old female with remote history of Wolff-Parkinson-White syndrome status post ablation as well as history of colovaginal fistula, history of intra-abdominal abscess in 2021, and history of seizure in 2021, currently not on antiseizure medications, no follow-up with neurology due to transportation difficulty, brought by EMS to the emergency room on 5/20 after having witnessed tonic-clonic seizure.  Patient given Keppra in the emergency room.  Admitted to the hospitalist service and started on daily Keppra.  Neurology consulted.  Assessment and Plan: * Seizure [facility]) Fall precautions and seizure precautions.  Alcohol, B12, TSH all within normal limits.  Urine drug screen positive for cocaine and THC.  Currently on Keppra twice daily.  Seen by neurology.  EEG on 5/22 unremarkable.  Cause felt to likely be secondary to drug use.  Patient counseled for this.  As per neurology, patient does not need to be discharged on Keppra or other antiepileptic.  Polysubstance abuse ([place]) Urine drug screen positive for TCA and cocaine.  Counseled.  At first, patient denies that she has a drug problem.  Then becomes tearful  and admits that she needs to make better decisions.  Polypharmacy - Home medications include: Gabapentin 800 mg p.o. 3 times daily, Flexeril 5 mg 3 times daily as needed for muscle spasms, hydroxyzine 25 mg every 8 hours.  Cleared to resume Neurontin.  Open facial wound, initial encounter - Present on admission - Presumed secondary to falling on her face and hitting the rails at home - Skin laceration at the bridge of her nose and forehead  Neuropathy - Patient takes gabapentin 800 mg 3 times daily, although according to patient, she was recently increased to 4 times a day although not yet verified in chart.  Anxiety state - Home medications include alprazolam 0.5 mg nightly as needed for anxiety, gabapentin 800 mg 3 times daily, duloxetine 20 mg daily  Essential hypertension - Amlodipine 5 mg daily resumed for 11/30/2021 at 10 AM  WPW (Wolff-Parkinson-White syndrome) - Remote history, status post ablation  Tobacco use disorder - Nicotine patch as needed for nicotine craving ordered  Overweight (BMI 25.0-29.9) Patient with BMI greater than 25        Pain control - [place] Controlled Substance Reporting System database was reviewed. and patient was instructed, not to drive, operate heavy machinery, perform activities at heights, swimming or participation in water activities or provide baby-sitting services while on Pain, Sleep and Anxiety Medications; until their outpatient Physician has advised to do so again. Also recommended to not to take more than prescribed Pain, Sleep and Anxiety Medications.  Consultants: Neurology Procedures performed: EEG Disposition: Home Diet recommendation:  Discharge Diet Orders (From admission, onward)     Start  Ordered   12/01/21 0000  Diet - low sodium heart healthy        12/01/21 1514           Cardiac diet DISCHARGE MEDICATION: Allergies as of 12/01/2021       Reactions   Ciprofloxacin Rash   Morphine Rash         Medication List     TAKE these medications    acetaminophen 500 MG tablet Commonly known as: TYLENOL Take 500 mg by mouth every 6 (six) hours as needed.   albuterol 108 (90 Base) MCG/ACT inhaler Commonly known as: VENTOLIN HFA Inhale 1-2 puffs into the lungs every 4 (four) hours as needed.   amLODipine 5 MG tablet Commonly known as: NORVASC Take 5 mg by mouth daily.   aspirin EC 81 MG tablet Take 81 mg by mouth daily.   clotrimazole-betamethasone cream Commonly known as: Lotrisone Apply 1 application. topically 2 (two) times daily.   cyclobenzaprine 5 MG tablet Commonly known as: FLEXERIL Take 5 mg by mouth 3 (three) times daily as needed.   diclofenac Sodium 1 % Gel Commonly known as: VOLTAREN Apply 4 g topically 4 (four) times daily.   DULoxetine 20 MG capsule Commonly known as: CYMBALTA Take 20 mg by mouth daily.   gabapentin 800 MG tablet Commonly known as: NEURONTIN Take 800 mg by mouth 3 (three) times daily.   hydrOXYzine 25 MG tablet Commonly known as: ATARAX Take 25 mg by mouth every 8 (eight) hours as needed.   nicotine 14 mg/24hr patch Commonly known as: NICODERM CQ - dosed in mg/24 hours Place 1 patch (14 mg total) onto the skin daily as needed (nicotine craving).   pantoprazole 40 MG tablet Commonly known as: Protonix Take 1 tablet (40 mg total) by mouth daily.   tiotropium 18 MCG inhalation capsule Commonly known as: SPIRIVA Place 18 mcg into inhaler and inhale daily.   zolpidem 5 MG tablet Commonly known as: AMBIEN Take 5 mg by mouth at bedtime as needed.        Discharge Exam: Filed Weights   11/29/21 1759 11/30/21 0054  Weight: 62.6 kg 63.1 kg   General: Alert and oriented x3 Cardiovascular: Regular rate and rhythm, S1-S2 Lungs: Clear to auscultation bilaterally  Condition at discharge: good  The results of significant diagnostics from this hospitalization (including imaging, microbiology, ancillary and laboratory) are  listed below for reference.   Imaging Studies: CT HEAD WO CONTRAST (5MM)  Result Date: 11/29/2021 CLINICAL DATA:  Seizure disorder, clinical change Seizure striking head on railing of porch.  With pain. EXAM: CT HEAD WITHOUT CONTRAST TECHNIQUE: Contiguous axial images were obtained from the base of the skull through the vertex without intravenous contrast. RADIATION DOSE REDUCTION: This exam was performed according to the departmental dose-optimization program which includes automated exposure control, adjustment of the mA and/or kV according to patient size and/or use of iterative reconstruction technique. COMPARISON:  Remote head CT 03/11/2011 FINDINGS: Brain: Generalized atrophy. No intracranial hemorrhage, mass effect, or midline shift. No hydrocephalus. The basilar cisterns are patent. There is mild periventricular chronic small vessel ischemic change. No evidence of territorial infarct or acute ischemia. No extra-axial or intracranial fluid collection. Vascular: No hyperdense vessel or unexpected calcification. Skull: No fracture or focal lesion. Sinuses/Orbits: Assessed on concurrent face CT, reported separately. Other: None. IMPRESSION: 1. No acute intracranial abnormality. No skull fracture. 2. Generalized atrophy and mild chronic small vessel ischemic change. Electronically Signed   By: Aurther Loft.D.  On: 11/29/2021 18:47   CT Cervical Spine Wo Contrast  Result Date: 11/29/2021 CLINICAL DATA:  Polytrauma, blunt EXAM: CT CERVICAL SPINE WITHOUT CONTRAST TECHNIQUE: Multidetector CT imaging of the cervical spine was performed without intravenous contrast. Multiplanar CT image reconstructions were also generated. RADIATION DOSE REDUCTION: This exam was performed according to the departmental dose-optimization program which includes automated exposure control, adjustment of the mA and/or kV according to patient size and/or use of iterative reconstruction technique. COMPARISON:  None Available.  FINDINGS: Alignment: Normal. Skull base and vertebrae: No acute fracture. Vertebral body heights are maintained. The dens and skull base are intact. Soft tissues and spinal canal: No prevertebral fluid or swelling. No visible canal hematoma. Disc levels: Degenerative disc disease from C4-C5 through C6-C7 with disc space narrowing and spurring. There is mild multilevel facet hypertrophy. Upper chest: Right upper lobe pulmonary nodule measures 10 x 4 mm (7 mm mean diameter), only partially included in the field of view, series 3, image 86. this is new from 03/15/2011 chest CT. Other: Carotid calcifications. IMPRESSION: 1. No acute fracture or subluxation of the cervical spine. 2. Mild degenerative disc disease and facet hypertrophy. 3. Right upper lobe pulmonary nodule measures 10 x 4 mm (7 mm mean diameter). This is new from 03/15/2011 chest CT. Follow-up recommendations for nodule of this size are as follows: Non-contrast chest CT at 6-12 months is recommended. If the nodule is stable at time of repeat CT, then future CT at 18-24 months (from today's scan) is considered optional for low-risk patients, but is recommended for high-risk patients. This recommendation follows the consensus statement: Guidelines for Management of Incidental Pulmonary Nodules Detected on CT Images: From the Fleischner Society 2017; Radiology 2017; 284:228-243. Electronically Signed   By: Keith Rake M.D.   On: 11/29/2021 18:54   CT Lumbar Spine Wo Contrast  Result Date: 11/29/2021 CLINICAL DATA:  Ataxia and lumbar trauma EXAM: CT LUMBAR SPINE WITHOUT CONTRAST TECHNIQUE: Multidetector CT imaging of the lumbar spine was performed without intravenous contrast administration. Multiplanar CT image reconstructions were also generated. RADIATION DOSE REDUCTION: This exam was performed according to the departmental dose-optimization program which includes automated exposure control, adjustment of the mA and/or kV according to patient size  and/or use of iterative reconstruction technique. COMPARISON:  12/14/2010 FINDINGS: Segmentation: 5 lumbar type vertebrae. Alignment: Normal. Vertebrae: Superior endplate Schmorl's node at T12. No acute abnormality. Chronic degenerative remodeling at L4-5. Paraspinal and other soft tissues: Calcific aortic atherosclerosis. Disc levels: No spinal canal stenosis. IMPRESSION: 1. No acute fracture or static subluxation of the lumbar spine. 2. Aortic Atherosclerosis (ICD10-I70.0). Electronically Signed   By: Ulyses Jarred M.D.   On: 11/29/2021 21:45   MR Brain W and Wo Contrast  Result Date: 11/29/2021 CLINICAL DATA:  Seizure EXAM: MRI HEAD WITHOUT AND WITH CONTRAST TECHNIQUE: Multiplanar, multiecho pulse sequences of the brain and surrounding structures were obtained without and with intravenous contrast. CONTRAST:  60m GADAVIST GADOBUTROL 1 MMOL/ML IV SOLN COMPARISON:  None Available. FINDINGS: Brain: No acute infarct, mass effect or extra-axial collection. No acute or chronic hemorrhage. Normal white matter signal. Generalized volume loss. The midline structures are normal. Vascular: Major flow voids are preserved. Skull and upper cervical spine: Normal calvarium and skull base. Visualized upper cervical spine and soft tissues are normal. Sinuses/Orbits:No paranasal sinus fluid levels or advanced mucosal thickening. No mastoid or middle ear effusion. Normal orbits. IMPRESSION: 1. No acute intracranial abnormality. 2. Generalized volume loss. Electronically Signed   By: KCletus GashD.  On: 11/29/2021 23:15   DG Chest Port 1 View  Result Date: 11/30/2021 CLINICAL DATA:  Fall EXAM: PORTABLE CHEST 1 VIEW COMPARISON:  01/04/2018 FINDINGS: Left basilar atelectasis. Lungs are otherwise clear. Normal cardiomediastinal contours. IMPRESSION: Left basilar atelectasis. Electronically Signed   By: Ulyses Jarred M.D.   On: 11/30/2021 01:07   DG Knee Complete 4 Views Right  Result Date: 11/29/2021 CLINICAL DATA:   Fall, right knee pain EXAM: RIGHT KNEE - COMPLETE 4+ VIEW COMPARISON:  11/25/2018 FINDINGS: No evidence of fracture, dislocation, or joint effusion. Similar mild tricompartmental degenerative changes. Mild soft tissue swelling at the medial aspect of the knee. IMPRESSION: 1. Mild soft tissue swelling at the medial aspect of the knee. No acute fracture or effusion. 2. Similar mild tricompartmental degenerative changes. Electronically Signed   By: Davina Poke D.O.   On: 11/29/2021 21:24   DG Foot Complete Left  Result Date: 11/29/2021 CLINICAL DATA:  Fall and trauma to the left foot. EXAM: LEFT FOOT - COMPLETE 3+ VIEW COMPARISON:  None Available. FINDINGS: There is no acute fracture or dislocation. The bones are osteopenic. No significant arthritic changes. The soft tissues are unremarkable. IMPRESSION: Negative. Electronically Signed   By: Anner Crete M.D.   On: 11/29/2021 21:18   DG Foot Complete Right  Result Date: 11/29/2021 CLINICAL DATA:  Fall, right foot pain EXAM: RIGHT FOOT COMPLETE - 3+ VIEW COMPARISON:  06/03/2021 FINDINGS: No evidence of acute fracture. No dislocation. Chronic severe first MTP joint arthropathy. Joint appears partially ankylosed. Chronic ankylosis of the second toe PIP joint. Small plantar calcaneal spur. No focal soft tissue swelling. IMPRESSION: 1. No evidence of acute fracture or dislocation. 2. Chronic severe first MTP joint arthropathy. Electronically Signed   By: Davina Poke D.O.   On: 11/29/2021 21:23   EEG adult  Result Date: 12/01/2021 Derek Jack, MD     12/01/2021  5:09 PM Routine EEG Report Chai R Tison is a 61 y.o. female with a history of seizure who is undergoing an EEG to evaluate for seizures. Report: This EEG was acquired with electrodes placed according to the International 10-20 electrode system (including Fp1, Fp2, F3, F4, C3, C4, P3, P4, O1, O2, T3, T4, T5, T6, A1, A2, Fz, Cz, Pz). The following electrodes were missing or displaced:  none. The occipital dominant rhythm was 8.5 Hz with intermittent mild diffuse slowing. This activity is reactive to stimulation. Drowsiness was manifested by background fragmentation; deeper stages of sleep were identified by K complexes and sleep spindles. There was no focal slowing. There were no interictal epileptiform discharges. There were no electrographic seizures identified. There was no abnormal response to photic stimulation. Hyperventilation was not performed. Impression and clinical correlation: This EEG was obtained while awake and asleep and is abnormal due to intermittent mild diffuse slowing indicative of global cerebral dysfunction. Epileptiform abnormalities were not seen during this recording. Su Monks, MD [facility] [phone] If 7pm- 7am, please page neurology on call as listed in [place].   CT Maxillofacial Wo Contrast  Result Date: 11/29/2021 CLINICAL DATA:  Facial trauma, blunt EXAM: CT MAXILLOFACIAL WITHOUT CONTRAST TECHNIQUE: Multidetector CT imaging of the maxillofacial structures was performed. Multiplanar CT image reconstructions were also generated. RADIATION DOSE REDUCTION: This exam was performed according to the departmental dose-optimization program which includes automated exposure control, adjustment of the mA and/or kV according to patient size and/or use of iterative reconstruction technique. COMPARISON:  None Available. FINDINGS: Osseous: Mild motion artifact limitations. Allowing for this, no  evidence of acute fracture of the nasal bone, zygomatic arches, or mandibles. The temporomandibular joints are congruent. Edentulous of upper teeth. Mild leftward nasal septal deviation. Orbits: No orbital fracture.  No globe injury. Sinuses: No sinus fracture or hemosinus. The paranasal sinuses are clear. There is no mastoid effusion. The right mastoid air cells are hypo pneumatized, chronic. Soft tissues: No confluent hematoma. Limited intracranial: Assessed on  concurrent head CT, reported separately. IMPRESSION: No acute facial bone fracture, allowing for mild motion artifact limitations. Electronically Signed   By: Keith Rake M.D.   On: 11/29/2021 18:50    Microbiology: Results for orders placed or performed during the hospital encounter of 02/27/20  SARS Coronavirus 2 by RT PCR (hospital order, performed in [facility] hospital lab) Nasopharyngeal     Status: None   Collection Time: 02/27/20 10:46 PM   Specimen: Nasopharyngeal  Result Value Ref Range Status   SARS Coronavirus 2 NEGATIVE NEGATIVE Final    Comment: (NOTE) SARS-CoV-2 target nucleic acids are NOT DETECTED.  The SARS-CoV-2 RNA is generally detectable in upper and lower respiratory specimens during the acute phase of infection. The lowest concentration of SARS-CoV-2 viral copies this assay can detect is 250 copies / mL. A negative result does not preclude SARS-CoV-2 infection and should not be used as the sole basis for treatment or other patient management decisions.  A negative result may occur with improper specimen collection / handling, submission of specimen other than nasopharyngeal swab, presence of viral mutation(s) within the areas targeted by this assay, and inadequate number of viral copies (<250 copies / mL). A negative result must be combined with clinical observations, patient history, and epidemiological information.  Fact Sheet for Patients:   StrictlyIdeas.no  Fact Sheet for Healthcare Providers: BankingDealers.co.za  This test is not yet approved or  cleared by the [place] FDA and has been authorized for detection and/or diagnosis of SARS-CoV-2 by FDA under an Emergency Use Authorization (EUA).  This EUA will remain in effect (meaning this test can be used) for the duration of the COVID-19 declaration under Section 564(b)(1) of the Act, 21 U.S.C. section 360bbb-3(b)(1), unless the authorization is  terminated or revoked sooner.  Performed at [facility], [address], [place], [place] [address]   Urine culture     Status: Abnormal   Collection Time: 02/27/20 10:46 PM   Specimen: Urine, Random  Result Value Ref Range Status   Specimen Description   Final    URINE, RANDOM Performed at [facility], [address], [place], [place] [address]    Special Requests   Final    NONE Performed at [facility], [address] [place], [place] [address]    Culture (A)  Final    >=100,000 COLONIES/mL MULTIPLE SPECIES PRESENT, SUGGEST RECOLLECTION   Report Status 02/29/2020 FINAL  Final    Labs: CBC: Recent Labs  Lab 11/29/21 1804 11/30/21 0622  WBC 8.1 7.3  HGB 12.0 12.1  HCT 36.6 34.3*  MCV 89.5 89.1  PLT 362 914   Basic Metabolic Panel: Recent Labs  Lab 11/29/21 1804 11/30/21 0622  NA 135 138  K 3.7 3.6  CL 106 105  CO2 23 26  GLUCOSE 140* 82  BUN 20 15  CREATININE 0.75 0.61  CALCIUM 9.0 8.8*   Liver Function Tests: Recent Labs  Lab 11/29/21 1804  AST 22  ALT 13  ALKPHOS 67  BILITOT 0.7  PROT 6.9  ALBUMIN 4.0   CBG: Recent  Labs  Lab 11/30/21 1128  GLUCAP 134*    Discharge time spent: less than 30 minutes.  Signed: Annita Brod, MD [facility] 12/01/2021

## 2021-12-01 NOTE — Progress Notes (Signed)
EEG complete - results pending 

## 2021-12-02 ENCOUNTER — Other Ambulatory Visit: Payer: Medicaid Other

## 2021-12-23 ENCOUNTER — Ambulatory Visit: Payer: Medicaid Other | Admitting: Podiatry

## 2022-01-22 ENCOUNTER — Encounter: Payer: Self-pay | Admitting: Emergency Medicine

## 2022-01-22 ENCOUNTER — Other Ambulatory Visit: Payer: Self-pay

## 2022-01-22 ENCOUNTER — Emergency Department: Payer: Medicaid Other

## 2022-01-22 ENCOUNTER — Emergency Department
Admission: EM | Admit: 2022-01-22 | Discharge: 2022-01-22 | Discharge status: Against Medical Advice | Payer: Medicaid Other | Attending: Student | Admitting: Student

## 2022-01-22 DIAGNOSIS — R197 Diarrhea, unspecified: Secondary | ICD-10-CM | POA: Diagnosis not present

## 2022-01-22 DIAGNOSIS — R1084 Generalized abdominal pain: Secondary | ICD-10-CM | POA: Insufficient documentation

## 2022-01-22 DIAGNOSIS — R109 Unspecified abdominal pain: Secondary | ICD-10-CM | POA: Diagnosis present

## 2022-01-22 DIAGNOSIS — Z5321 Procedure and treatment not carried out due to patient leaving prior to being seen by health care provider: Secondary | ICD-10-CM | POA: Diagnosis not present

## 2022-01-22 LAB — COMPREHENSIVE METABOLIC PANEL
ALT: 15 U/L (ref 0–44)
AST: 20 U/L (ref 15–41)
Albumin: 3.8 g/dL (ref 3.5–5.0)
Alkaline Phosphatase: 57 U/L (ref 38–126)
Anion gap: 10 (ref 5–15)
BUN: 23 mg/dL (ref 8–23)
CO2: 22 mmol/L (ref 22–32)
Calcium: 9.1 mg/dL (ref 8.9–10.3)
Chloride: 109 mmol/L (ref 98–111)
Creatinine, Ser: 0.92 mg/dL (ref 0.44–1.00)
GFR, Estimated: >60 mL/min (ref >60)
Glucose, Bld: 108 mg/dL — ABNORMAL HIGH (ref 70–99)
Potassium: 3.4 mmol/L — ABNORMAL LOW (ref 3.5–5.1)
Sodium: 141 mmol/L (ref 135–145)
Total Bilirubin: 0.8 mg/dL (ref 0.3–1.2)
Total Protein: 6.8 g/dL (ref 6.5–8.1)

## 2022-01-22 LAB — CBC WITH DIFFERENTIAL/PLATELET
Abs Immature Granulocytes: 0.03 10*3/uL (ref 0.00–0.07)
Basophils Absolute: 0.1 10*3/uL (ref 0.0–0.1)
Basophils Relative: 1 %
Eosinophils Absolute: 0.3 10*3/uL (ref 0.0–0.5)
Eosinophils Relative: 3 %
HCT: 36.1 % (ref 36.0–46.0)
Hemoglobin: 11.6 g/dL — ABNORMAL LOW (ref 12.0–15.0)
Immature Granulocytes: 0 %
Lymphocytes Relative: 29 %
Lymphs Abs: 3.1 10*3/uL (ref 0.7–4.0)
MCH: 30.8 pg (ref 26.0–34.0)
MCHC: 32.1 g/dL (ref 30.0–36.0)
MCV: 95.8 fL (ref 80.0–100.0)
Monocytes Absolute: 1.1 10*3/uL — ABNORMAL HIGH (ref 0.1–1.0)
Monocytes Relative: 10 %
Neutro Abs: 6 10*3/uL (ref 1.7–7.7)
Neutrophils Relative %: 57 %
Platelets: 343 10*3/uL (ref 150–400)
RBC: 3.77 MIL/uL — ABNORMAL LOW (ref 3.87–5.11)
RDW: 15.8 % — ABNORMAL HIGH (ref 11.5–15.5)
WBC: 10.5 10*3/uL (ref 4.0–10.5)
nRBC: 0 % (ref 0.0–0.2)

## 2022-01-22 LAB — LIPASE, BLOOD: Lipase: 34 U/L (ref 11–51)

## 2022-01-22 LAB — TROPONIN I (HIGH SENSITIVITY): Troponin I (High Sensitivity): 7 ng/L (ref <18)

## 2022-01-22 MED ORDER — IOHEXOL 300 MG/ML  SOLN
100.0000 mL | Freq: Once | INTRAMUSCULAR | Status: AC | PRN
  Administered 2022-01-22: 100 mL via INTRAVENOUS

## 2022-01-22 MED ORDER — ACETAMINOPHEN 325 MG PO TABS: 650.0000 mg | ORAL_TABLET | Freq: Once | ORAL | Status: DC

## 2022-01-22 NOTE — ED Triage Notes (Signed)
Pt via POV from home. Pt generalized abd pain that started yesterday. States that pain radiates to her back. Denies NV. But states that she does have some diarrhea for the past couple. States that the episode of diarrhea has been white. Denies any rectal bleeding.  Pt has a hx extensive GI hx. Pt is A&OX4 and NAD

## 2022-01-22 NOTE — ED Notes (Signed)
Pt got upset because of being left in subwait and being "forgotten" about. I assured pt that we had npt forgotten about her and that we were bus tonight. Pt then started taking off vital equipment and stated that she "should have went to [facility]" and that she wanted to leave. This tech told pt I will need to take IV out before pt can leave. This tech took IV out and pt walked out.

## 2022-01-22 NOTE — ED Provider Triage Note (Signed)
Emergency Medicine Provider Triage Evaluation Note  Alison Walker , a 61 y.o. female  was evaluated in triage.  Pt complains of right sided abdominal pain that radiates to right flank and white stool since last night. Reports that she has had multiple abdominal surgeries. No N/V. Reports diarrhea.  Has known compression fracture in her back  Review of Systems  Positive: Abd pain, white stool Negative: Fever, urinary symptoms  Physical Exam  BP 123/74 (BP Location: Left Arm)   Pulse (!) 104   Temp 98.4 F (36.9 C) (Oral)   Resp 18   SpO2 98%  Gen:   Awake, no distress   Resp:  Normal effort  MSK:   Moves extremities without difficulty  Other:    Medical Decision Making  Medically screening exam initiated at 5:39 PM.  Appropriate orders placed.  Audryna R Castoro was informed that the remainder of the evaluation will be completed by another provider, this initial triage assessment does not replace that evaluation, and the importance of remaining in the ED until their evaluation is complete.    Marquette Old, PA-C 01/22/22 1745

## 2022-04-27 ENCOUNTER — Ambulatory Visit: Payer: Self-pay | Admitting: Dermatology

## 2022-05-20 ENCOUNTER — Other Ambulatory Visit: Payer: Self-pay | Admitting: Family Medicine

## 2022-05-20 DIAGNOSIS — Z1231 Encounter for screening mammogram for malignant neoplasm of breast: Secondary | ICD-10-CM

## 2022-05-21 ENCOUNTER — Encounter: Payer: Self-pay | Admitting: Family Medicine

## 2022-06-19 ENCOUNTER — Telehealth: Payer: Self-pay | Admitting: *Deleted

## 2022-06-19 NOTE — Telephone Encounter (Signed)
I have reviewed her colonoscopy report from 10/02/2019.  The exam was normal and the recommendation was made to repeat colonoscopy in 10 years for screening.  Please convey the same to the patient and her PCP   Colonoscopy 10/02/2019 Impression: - The examined portion of the ileum was normal. - Patent end-to-side colo-colonic anastomosis,  characterized by mild stenosis. Recommendation: - The patient will be observed post-procedure, until  all discharge criteria are met. - Discharge patient to home. - Patient has a contact number available for  emergencies. The signs and symptoms of potential  delayed complications were discussed with the patient.  Return to normal activities tomorrow. Written  discharge instructions were provided to the patient. - Return to referring physician for further  recommendations. - Repeat colonoscopy in 10 years for screening  purposes.     Cephas Darby, MD [facility], [place]  [place]  [place],  [address]  Main: [phone]  Fax: [phone] Pager: [phone]

## 2022-06-19 NOTE — Telephone Encounter (Signed)
Patient called office requesting to schedule her colonoscopy. However, patient is well established at [facility] for the last few years, her last colonoscopy was 10/02/2019 at [facility]. Patient keep mentioning that she had other issues but would not explain, kept talking about needing colonoscopy then CT scan then back track to needing colonoscopy. She also mention transportation issues.  Please advise.

## 2022-06-19 NOTE — Telephone Encounter (Signed)
Message left for patient to return my call.  

## 2022-06-22 NOTE — Telephone Encounter (Signed)
Message left for patient to return my call.  

## 2022-06-23 NOTE — Telephone Encounter (Signed)
Notified patient of Dr Verlin Grills comments. Patient verbalized understanding.

## 2022-07-03 ENCOUNTER — Ambulatory Visit: Payer: Medicaid Other | Admitting: Podiatry

## 2022-09-08 ENCOUNTER — Other Ambulatory Visit: Payer: Self-pay

## 2022-09-08 ENCOUNTER — Emergency Department
Admission: EM | Admit: 2022-09-08 | Discharge: 2022-09-08 | Disposition: A | Discharge status: Home / Self Care | Payer: Medicaid Other | Attending: Emergency Medicine | Admitting: Emergency Medicine

## 2022-09-08 DIAGNOSIS — D72829 Elevated white blood cell count, unspecified: Secondary | ICD-10-CM | POA: Insufficient documentation

## 2022-09-08 DIAGNOSIS — Z7982 Long term (current) use of aspirin: Secondary | ICD-10-CM | POA: Diagnosis not present

## 2022-09-08 DIAGNOSIS — I10 Essential (primary) hypertension: Secondary | ICD-10-CM | POA: Insufficient documentation

## 2022-09-08 DIAGNOSIS — R569 Unspecified convulsions: Secondary | ICD-10-CM | POA: Diagnosis not present

## 2022-09-08 DIAGNOSIS — M5441 Lumbago with sciatica, right side: Secondary | ICD-10-CM | POA: Diagnosis not present

## 2022-09-08 DIAGNOSIS — G8929 Other chronic pain: Secondary | ICD-10-CM | POA: Insufficient documentation

## 2022-09-08 DIAGNOSIS — M5442 Lumbago with sciatica, left side: Secondary | ICD-10-CM | POA: Diagnosis not present

## 2022-09-08 DIAGNOSIS — Z79899 Other long term (current) drug therapy: Secondary | ICD-10-CM | POA: Insufficient documentation

## 2022-09-08 DIAGNOSIS — M545 Low back pain, unspecified: Secondary | ICD-10-CM | POA: Diagnosis present

## 2022-09-08 LAB — COMPREHENSIVE METABOLIC PANEL
ALT: 35 U/L (ref 0–44)
AST: 42 U/L — ABNORMAL HIGH (ref 15–41)
Albumin: 4 g/dL (ref 3.5–5.0)
Alkaline Phosphatase: 106 U/L (ref 38–126)
Anion gap: 16 — ABNORMAL HIGH (ref 5–15)
BUN: 20 mg/dL (ref 8–23)
CO2: 19 mmol/L — ABNORMAL LOW (ref 22–32)
Calcium: 9.1 mg/dL (ref 8.9–10.3)
Chloride: 104 mmol/L (ref 98–111)
Creatinine, Ser: 0.73 mg/dL (ref 0.44–1.00)
GFR, Estimated: >60 mL/min (ref >60)
Glucose, Bld: 95 mg/dL (ref 70–99)
Potassium: 3.3 mmol/L — ABNORMAL LOW (ref 3.5–5.1)
Sodium: 139 mmol/L (ref 135–145)
Total Bilirubin: 0.4 mg/dL (ref 0.3–1.2)
Total Protein: 7.8 g/dL (ref 6.5–8.1)

## 2022-09-08 LAB — MAGNESIUM: Magnesium: 2 mg/dL (ref 1.7–2.4)

## 2022-09-08 LAB — CBC WITH DIFFERENTIAL/PLATELET
Abs Immature Granulocytes: 0.07 10*3/uL (ref 0.00–0.07)
Basophils Absolute: 0.1 10*3/uL (ref 0.0–0.1)
Basophils Relative: 1 %
Eosinophils Absolute: 0.2 10*3/uL (ref 0.0–0.5)
Eosinophils Relative: 2 %
HCT: 42.9 % (ref 36.0–46.0)
Hemoglobin: 13.7 g/dL (ref 12.0–15.0)
Immature Granulocytes: 1 %
Lymphocytes Relative: 35 %
Lymphs Abs: 4.3 10*3/uL — ABNORMAL HIGH (ref 0.7–4.0)
MCH: 29.7 pg (ref 26.0–34.0)
MCHC: 31.9 g/dL (ref 30.0–36.0)
MCV: 92.9 fL (ref 80.0–100.0)
Monocytes Absolute: 1.5 10*3/uL — ABNORMAL HIGH (ref 0.1–1.0)
Monocytes Relative: 12 %
Neutro Abs: 6.2 10*3/uL (ref 1.7–7.7)
Neutrophils Relative %: 49 %
Platelets: 357 10*3/uL (ref 150–400)
RBC: 4.62 MIL/uL (ref 3.87–5.11)
RDW: 14.5 % (ref 11.5–15.5)
WBC: 12.4 10*3/uL — ABNORMAL HIGH (ref 4.0–10.5)
nRBC: 0 % (ref 0.0–0.2)

## 2022-09-08 LAB — CBG MONITORING, ED
Glucose-Capillary: 93 mg/dL (ref 70–99)
Glucose-Capillary: 92 mg/dL (ref 70–99)

## 2022-09-08 MED ORDER — SODIUM CHLORIDE 0.9 % IV BOLUS
1000.0000 mL | Freq: Once | INTRAVENOUS | Status: AC
  Administered 2022-09-08: 1000 mL via INTRAVENOUS

## 2022-09-08 MED ORDER — LIDOCAINE 5 % EX PTCH: 1.0000 | MEDICATED_PATCH | Freq: Once | CUTANEOUS | Status: DC

## 2022-09-08 MED ORDER — LEVETIRACETAM 500 MG PO TABS: 500.0000 mg | ORAL_TABLET | Freq: Two times a day (BID) | ORAL | 2 refills | Status: DC

## 2022-09-08 MED ORDER — HYDROCODONE-ACETAMINOPHEN 5-325 MG PO TABS: 2.0000 | ORAL_TABLET | Freq: Once | ORAL | Status: DC

## 2022-09-08 MED ORDER — PREDNISONE 20 MG PO TABS: 60.0000 mg | ORAL_TABLET | Freq: Once | ORAL | Status: DC

## 2022-09-08 MED ORDER — IBUPROFEN 600 MG PO TABS: 600.0000 mg | ORAL_TABLET | Freq: Once | ORAL | Status: DC

## 2022-09-08 MED ORDER — LIDOCAINE 5 % EX PTCH
1.0000 | MEDICATED_PATCH | Freq: Once | CUTANEOUS | Status: DC
  Administered 2022-09-08: 1 via TRANSDERMAL
  Filled 2022-09-08: qty 1

## 2022-09-08 MED ORDER — LEVETIRACETAM IN NACL 1000 MG/100ML IV SOLN
1000.0000 mg | Freq: Once | INTRAVENOUS | Status: AC
  Administered 2022-09-08: 1000 mg via INTRAVENOUS
  Filled 2022-09-08: qty 100

## 2022-09-08 MED ORDER — ONDANSETRON 4 MG PO TBDP: 4.0000 mg | ORAL_TABLET | Freq: Once | ORAL | Status: DC

## 2022-09-08 MED ORDER — SODIUM CHLORIDE 0.9 % IV SOLN: INTRAVENOUS | Status: DC

## 2022-09-08 MED ORDER — KETOROLAC TROMETHAMINE 30 MG/ML IJ SOLN
30.0000 mg | Freq: Once | INTRAMUSCULAR | Status: AC
  Administered 2022-09-08: 30 mg via INTRAVENOUS
  Filled 2022-09-08: qty 1

## 2022-09-08 NOTE — ED Provider Notes (Signed)
[facility] Provider Note    Event Date/Time   First MD Initiated Contact with Patient 09/08/22 [phone]     (approximate)   History   Back Pain   HPI  Alison Walker is a 62 y.o. female patient here with seizures not on any medications, with WPW status post ablation, hypertension, chronic pain, drug abuse who presents to the emergency department with complaints of right lower back pain that started for the past several days that radiates down both legs.  She denies any known injury.  States she has had intermittent back pain since a fall a year ago.  Denies numbness, tingling, weakness, bowel or bladder incontinence, fever.  No back surgeries or epidural injections.  She called her doctor who told her she needed to make an appointment with [facility] but states she could not wait to be seen as an outpatient due to her level of pain.  States her boyfriend brought her to the ER.   History provided by patient.    Past Medical History:  Diagnosis Date   Diverticulitis    Hypertension    S/P ablation operation for arrhythmia    for WPW   Wolf-Parkinson-White syndrome     Past Surgical History:  Procedure Laterality Date   ABDOMINAL SURGERY     CHOLECYSTECTOMY     COLONOSCOPY     COLOSTOMY     COLOSTOMY REVISION      MEDICATIONS:  Prior to Admission medications   Medication Sig Start Date End Date Taking? Authorizing Provider  acetaminophen (TYLENOL) 500 MG tablet Take 500 mg by mouth every 6 (six) hours as needed.    [provider]  albuterol (VENTOLIN HFA) 108 (90 Base) MCG/ACT inhaler Inhale 1-2 puffs into the lungs every 4 (four) hours as needed.    [provider]  amLODipine (NORVASC) 5 MG tablet Take 5 mg by mouth daily.    [provider]  aspirin EC 81 MG tablet Take 81 mg by mouth daily.    [provider]  clotrimazole-betamethasone (LOTRISONE) cream Apply 1 application. topically 2 (two) times daily.  11/21/21   Edrick Kins, DPM  cyclobenzaprine (FLEXERIL) 5 MG tablet Take 5 mg by mouth 3 (three) times daily as needed.    [provider]  diclofenac Sodium (VOLTAREN) 1 % GEL Apply 4 g topically 4 (four) times daily.    [provider]  DULoxetine (CYMBALTA) 20 MG capsule Take 20 mg by mouth daily.    [provider]  gabapentin (NEURONTIN) 800 MG tablet Take 800 mg by mouth 3 (three) times daily.    [provider]  hydrOXYzine (ATARAX) 25 MG tablet Take 25 mg by mouth every 8 (eight) hours as needed. 11/17/21   [provider]  nicotine (NICODERM CQ - DOSED IN MG/24 HOURS) 14 mg/24hr patch Place 1 patch (14 mg total) onto the skin daily as needed (nicotine craving). 12/01/21   Annita Brod, MD  pantoprazole (PROTONIX) 40 MG tablet Take 1 tablet (40 mg total) by mouth daily. 01/04/18 11/30/21  Earleen Newport, MD  tiotropium (SPIRIVA) 18 MCG inhalation capsule Place 18 mcg into inhaler and inhale daily.    [provider]  zolpidem (AMBIEN) 5 MG tablet Take 5 mg by mouth at bedtime as needed. 11/07/21   [provider]    Physical Exam   Triage Vital Signs: ED Triage Vitals  Enc Vitals Group     BP 09/08/22 0526 Marland Kitchen)  155/74     Pulse Rate 09/08/22 0526 84     Resp 09/08/22 0526 20     Temp 09/08/22 0526 97.9 F (36.6 C)     Temp Source 09/08/22 0526 Oral     SpO2 09/08/22 0526 97 %     Weight 09/08/22 0527 136 lb (61.7 kg)     Height 09/08/22 0527 '5\' 2"'$  (1.575 m)     Head Circumference --      Peak Flow --      Pain Score 09/08/22 0533 10     Pain Loc --      Pain Edu? --      Excl. in [place]? --     Most recent vital signs: Vitals:   09/08/22 0526 09/08/22 0630  BP: (!) 155/74 (!) 156/129  Pulse: 84 97  Resp: 20   Temp: 97.9 F (36.6 C)   SpO2: 97% 98%    CONSTITUTIONAL: Alert, responds appropriately to questions.  Chronically ill-appearing, appears much older than stated age HEAD: Normocephalic,  atraumatic EYES: Conjunctivae clear, pupils appear equal, sclera nonicteric ENT: normal nose; moist mucous membranes NECK: Supple, normal ROM CARD: RRR; S1 and S2 appreciated RESP: Normal chest excursion without splinting or tachypnea; breath sounds clear and equal bilaterally; no wheezes, no rhonchi, no rales, no hypoxia or respiratory distress, speaking full sentences ABD/GI: Non-distended; soft, non-tender, no rebound, no guarding, no peritoneal signs BACK: The back appears normal, tender over the right lower back without overlying skin changes.  No step-off or deformity. EXT: Normal ROM in all joints; no deformity noted, no edema SKIN: Normal color for age and race; warm; no rash on exposed skin NEURO: Moves all extremities equally, normal speech, normal sensation, no saddle anesthesia, normal gait, 2+ deep tendon reflexes, no clonus PSYCH: The patient's mood and manner are appropriate.   ED Results / Procedures / Treatments   LABS: (all labs ordered are listed, but only abnormal results are displayed) Labs Reviewed  COMPREHENSIVE METABOLIC PANEL - Abnormal; Notable for the following components:      Result Value   Potassium 3.3 (*)    CO2 19 (*)    AST 42 (*)    Anion gap 16 (*)    All other components within normal limits  CBC WITH DIFFERENTIAL/PLATELET - Abnormal; Notable for the following components:   WBC 12.4 (*)    Lymphs Abs 4.3 (*)    Monocytes Absolute 1.5 (*)    All other components within normal limits  URINE DRUG SCREEN, QUALITATIVE ([facility] ONLY)  URINALYSIS, ROUTINE W REFLEX MICROSCOPIC  MAGNESIUM  CBG MONITORING, ED  CBG MONITORING, ED     EKG:  EKG Interpretation  Date/Time:  Tuesday September 08 2022 06:53:54 EST Ventricular Rate:  82 PR Interval:  163 QRS Duration: 100 QT Interval:  428 QTC Calculation: 500 R Axis:   53 Text Interpretation: Sinus rhythm Borderline prolonged QT interval Confirmed by Pryor Curia [phone]) on 09/08/2022 6:57:37 AM          RADIOLOGY: My personal review and interpretation of imaging:    I have personally reviewed all radiology reports.   No results found.   PROCEDURES:  Critical Care performed: Yes, see critical care procedure note(s)   CRITICAL CARE Performed by: Pryor Curia   Total critical care time: 40 minutes  Critical care time was exclusive of separately billable procedures and treating other patients.  Critical care was necessary to treat or prevent imminent or life-threatening deterioration.  Critical care  was time spent personally by me on the following activities: development of treatment plan with patient and/or surrogate as well as nursing, discussions with consultants, evaluation of patient's response to treatment, examination of patient, obtaining history from patient or surrogate, ordering and performing treatments and interventions, ordering and review of laboratory studies, ordering and review of radiographic studies, pulse oximetry and re-evaluation of patient's condition.   Marland Kitchen1-3 Lead EKG Interpretation  Performed by: Castor Gittleman, Delice Bison, DO Authorized by: Evonte Prestage, Delice Bison, DO     Interpretation: normal     ECG rate:  97   ECG rate assessment: normal     Rhythm: sinus rhythm     Ectopy: none     Conduction: normal       IMPRESSION / MDM / ASSESSMENT AND PLAN / ED COURSE  I reviewed the triage vital signs and the nursing notes.    Patient here with acute exacerbation of her chronic back pain.  No focal neurologic deficits.  No new injury.  The patient is on the cardiac monitor to evaluate for evidence of arrhythmia and/or significant heart rate changes.   DIFFERENTIAL DIAGNOSIS (includes but not limited to):   Suspect radiculopathy versus musculoskeletal strain, spasm.  Low suspicion for cauda equina, epidural abscess or hematoma, discitis or osteomyelitis, transverse myelitis, infection.   Patient's presentation is most consistent with acute presentation  with potential threat to life or bodily function.   PLAN: Initially patient was here for uncomplicated back pain.  As I was ordering her pain medication registration noted that patient was having a generalized tonic-clonic seizure that lasted for less than 30 seconds.  Was postictal afterwards.  On review of her records it does appear she has a history of seizures but is not on medication for this as it is thought secondary to substance abuse.  It appears she has had a seizure in 2021 and then was admitted in May 2023 for the same and was positive for cocaine at that time.  She is slowly coming back to and is talking and moving all extremities.  I do not feel she needs emergent hemorrhaging.  Will obtain labs, urine and give IV Keppra.  Patient will need to be monitored until back to her clinical baseline but anticipate discharging her home with Keppra 500 twice daily with close neuro follow-up.   MEDICATIONS GIVEN IN ED: Medications  sodium chloride 0.9 % bolus 1,000 mL (1,000 mLs Intravenous New Bag/Given 09/08/22 0644)    And  0.9 %  sodium chloride infusion ( Intravenous New Bag/Given 09/08/22 0715)  lidocaine (LIDODERM) 5 % 1 patch (has no administration in time range)  levETIRAcetam (KEPPRA) IVPB 1000 mg/100 mL premix (0 mg Intravenous Stopped 09/08/22 0712)  ketorolac (TORADOL) 30 MG/ML injection 30 mg (30 mg Intravenous Given 09/08/22 0730)     ED COURSE: Patient now more awake and requesting something for pain.  Will attempt to avoid narcotics and sedatives given her seizure activity and history of substance abuse.  Will give Toradol, Lidoderm.  Patient's labs show glucose of 92.  Normal electrolytes, LFTs.  Mild leukocytosis.  Normal hemoglobin.  Urine pending.  Signed out to oncoming ED physician.   CONSULTS:  pending   OUTSIDE RECORDS REVIEWED: Reviewed last admission in May 2023.       FINAL CLINICAL IMPRESSION(S) / ED DIAGNOSES   Final diagnoses:  Seizure ([place])  Acute  right-sided low back pain with bilateral sciatica     Rx / DC Orders   ED  Discharge Orders     None        Note:  This document was prepared using Dragon voice recognition software and may include unintentional dictation errors.   Semira Stoltzfus, Delice Bison, DO 09/08/22 [phone]

## 2022-09-08 NOTE — ED Triage Notes (Addendum)
Pt arrives via POV with CC of chronic pack pain that pt rates 10/10. Denies any recent trauma or injury. Pt appears to be in no acute distress at this time - conversing and laughing with visitor throughout triage process. Called primary care and was scheduled for assessment but pt felt like she could not wait.

## 2022-09-08 NOTE — ED Provider Notes (Signed)
-----------------------------------------   7:04 AM on 09/08/2022 -----------------------------------------  Blood pressure (!) 156/129, pulse 97, temperature 97.9 F (36.6 C), temperature source Oral, resp. rate 20, height 5' 2"$  (1.575 m), weight 61.7 kg, SpO2 98 %.  Assuming care from Dr. Leonides Schanz.  In short, Alison Walker is a 62 y.o. female with a chief complaint of Back Pain .  Refer to the original H&P for additional details.  The current plan of care is to reassess following brief seizure here in the ED.  ----------------------------------------- 9:10 AM on 09/08/2022 ----------------------------------------- Patient now awake and alert, answering all questions appropriately with no further seizure episodes.  Back pain improved following Toradol and Lidoderm patch, no findings concerning for cauda equina.  Patient is appropriate for discharge home, will start her on Keppra 500 mg twice daily per previous discussion with Dr. Leonides Schanz.  She was counseled to not drive or operate heavy machinery until cleared by neurology.  She was counseled to return to the ED for new or worsening symptoms, patient agrees with plan.    Blake Divine, MD 09/08/22 [phone]

## 2022-09-08 NOTE — ED Notes (Signed)
UA when able  

## 2022-09-08 NOTE — ED Notes (Signed)
Staff walking by pts room noted pt to be laying in bed, having jerking type movement, and tongue was blue.  Pt unresponsive at that time.  Currently, pt is awake and alert.  MD Ward evaluated pt and orders placed accordingly.

## 2022-11-24 ENCOUNTER — Ambulatory Visit: Payer: Medicaid Other | Admitting: Podiatry

## 2022-12-10 DIAGNOSIS — M545 Low back pain, unspecified: Secondary | ICD-10-CM | POA: Diagnosis not present

## 2022-12-10 DIAGNOSIS — G40909 Epilepsy, unspecified, not intractable, without status epilepticus: Secondary | ICD-10-CM | POA: Diagnosis not present

## 2022-12-10 DIAGNOSIS — Z0131 Encounter for examination of blood pressure with abnormal findings: Secondary | ICD-10-CM | POA: Diagnosis not present

## 2022-12-10 DIAGNOSIS — Z1331 Encounter for screening for depression: Secondary | ICD-10-CM | POA: Diagnosis not present

## 2022-12-10 DIAGNOSIS — I10 Essential (primary) hypertension: Secondary | ICD-10-CM | POA: Diagnosis not present

## 2022-12-10 DIAGNOSIS — F411 Generalized anxiety disorder: Secondary | ICD-10-CM | POA: Diagnosis not present

## 2022-12-17 ENCOUNTER — Inpatient Hospital Stay: Payer: 59

## 2022-12-17 ENCOUNTER — Emergency Department: Payer: 59

## 2022-12-17 ENCOUNTER — Encounter (HOSPITAL_COMMUNITY): Payer: Self-pay

## 2022-12-17 ENCOUNTER — Inpatient Hospital Stay
Admission: EM | Admit: 2022-12-17 | Discharge: 2022-12-19 | DRG: 871 | Disposition: A | Discharge status: Home / Self Care | Payer: 59 | Attending: Internal Medicine | Admitting: Internal Medicine

## 2022-12-17 ENCOUNTER — Inpatient Hospital Stay: Admit: 2022-12-17 | Payer: Medicaid Other | Admitting: Internal Medicine

## 2022-12-17 ENCOUNTER — Ambulatory Visit: Payer: Medicaid Other

## 2022-12-17 DIAGNOSIS — J690 Pneumonitis due to inhalation of food and vomit: Secondary | ICD-10-CM | POA: Diagnosis present

## 2022-12-17 DIAGNOSIS — Z7982 Long term (current) use of aspirin: Secondary | ICD-10-CM | POA: Diagnosis not present

## 2022-12-17 DIAGNOSIS — Z881 Allergy status to other antibiotic agents status: Secondary | ICD-10-CM

## 2022-12-17 DIAGNOSIS — R410 Disorientation, unspecified: Secondary | ICD-10-CM | POA: Diagnosis not present

## 2022-12-17 DIAGNOSIS — G319 Degenerative disease of nervous system, unspecified: Secondary | ICD-10-CM | POA: Diagnosis not present

## 2022-12-17 DIAGNOSIS — J9690 Respiratory failure, unspecified, unspecified whether with hypoxia or hypercapnia: Secondary | ICD-10-CM | POA: Diagnosis not present

## 2022-12-17 DIAGNOSIS — F1210 Cannabis abuse, uncomplicated: Secondary | ICD-10-CM | POA: Diagnosis not present

## 2022-12-17 DIAGNOSIS — A419 Sepsis, unspecified organism: Secondary | ICD-10-CM | POA: Diagnosis not present

## 2022-12-17 DIAGNOSIS — I10 Essential (primary) hypertension: Secondary | ICD-10-CM | POA: Diagnosis present

## 2022-12-17 DIAGNOSIS — F17210 Nicotine dependence, cigarettes, uncomplicated: Secondary | ICD-10-CM | POA: Diagnosis not present

## 2022-12-17 DIAGNOSIS — E538 Deficiency of other specified B group vitamins: Secondary | ICD-10-CM | POA: Diagnosis not present

## 2022-12-17 DIAGNOSIS — G40901 Epilepsy, unspecified, not intractable, with status epilepticus: Secondary | ICD-10-CM | POA: Diagnosis present

## 2022-12-17 DIAGNOSIS — R918 Other nonspecific abnormal finding of lung field: Secondary | ICD-10-CM | POA: Diagnosis not present

## 2022-12-17 DIAGNOSIS — J189 Pneumonia, unspecified organism: Secondary | ICD-10-CM | POA: Diagnosis not present

## 2022-12-17 DIAGNOSIS — E871 Hypo-osmolality and hyponatremia: Secondary | ICD-10-CM | POA: Diagnosis present

## 2022-12-17 DIAGNOSIS — D649 Anemia, unspecified: Secondary | ICD-10-CM | POA: Insufficient documentation

## 2022-12-17 DIAGNOSIS — R55 Syncope and collapse: Secondary | ICD-10-CM | POA: Diagnosis not present

## 2022-12-17 DIAGNOSIS — F1310 Sedative, hypnotic or anxiolytic abuse, uncomplicated: Secondary | ICD-10-CM | POA: Diagnosis present

## 2022-12-17 DIAGNOSIS — Z79899 Other long term (current) drug therapy: Secondary | ICD-10-CM

## 2022-12-17 DIAGNOSIS — E876 Hypokalemia: Secondary | ICD-10-CM | POA: Diagnosis not present

## 2022-12-17 DIAGNOSIS — Z885 Allergy status to narcotic agent status: Secondary | ICD-10-CM

## 2022-12-17 DIAGNOSIS — Z933 Colostomy status: Secondary | ICD-10-CM

## 2022-12-17 DIAGNOSIS — D638 Anemia in other chronic diseases classified elsewhere: Secondary | ICD-10-CM | POA: Diagnosis not present

## 2022-12-17 DIAGNOSIS — R0902 Hypoxemia: Secondary | ICD-10-CM | POA: Diagnosis not present

## 2022-12-17 DIAGNOSIS — R0603 Acute respiratory distress: Secondary | ICD-10-CM | POA: Diagnosis present

## 2022-12-17 DIAGNOSIS — G40911 Epilepsy, unspecified, intractable, with status epilepticus: Secondary | ICD-10-CM | POA: Diagnosis not present

## 2022-12-17 DIAGNOSIS — R6520 Severe sepsis without septic shock: Secondary | ICD-10-CM

## 2022-12-17 DIAGNOSIS — F1910 Other psychoactive substance abuse, uncomplicated: Secondary | ICD-10-CM | POA: Diagnosis present

## 2022-12-17 DIAGNOSIS — J449 Chronic obstructive pulmonary disease, unspecified: Secondary | ICD-10-CM | POA: Diagnosis not present

## 2022-12-17 DIAGNOSIS — Z1152 Encounter for screening for COVID-19: Secondary | ICD-10-CM

## 2022-12-17 DIAGNOSIS — F1410 Cocaine abuse, uncomplicated: Secondary | ICD-10-CM | POA: Diagnosis present

## 2022-12-17 DIAGNOSIS — Z4682 Encounter for fitting and adjustment of non-vascular catheter: Secondary | ICD-10-CM | POA: Diagnosis not present

## 2022-12-17 DIAGNOSIS — R569 Unspecified convulsions: Secondary | ICD-10-CM | POA: Diagnosis not present

## 2022-12-17 DIAGNOSIS — R000 Tachycardia, unspecified: Secondary | ICD-10-CM | POA: Diagnosis not present

## 2022-12-17 LAB — CBC WITH DIFFERENTIAL/PLATELET
Abs Immature Granulocytes: 0.11 10*3/uL — ABNORMAL HIGH (ref 0.00–0.07)
Basophils Absolute: 0 10*3/uL (ref 0.0–0.1)
Basophils Relative: 0 %
Eosinophils Absolute: 0 10*3/uL (ref 0.0–0.5)
Eosinophils Relative: 0 %
HCT: 31.9 % — ABNORMAL LOW (ref 36.0–46.0)
Hemoglobin: 10.7 g/dL — ABNORMAL LOW (ref 12.0–15.0)
Immature Granulocytes: 1 %
Lymphocytes Relative: 4 %
Lymphs Abs: 0.6 10*3/uL — ABNORMAL LOW (ref 0.7–4.0)
MCH: 29.6 pg (ref 26.0–34.0)
MCHC: 33.5 g/dL (ref 30.0–36.0)
MCV: 88.4 fL (ref 80.0–100.0)
Monocytes Absolute: 0.9 10*3/uL (ref 0.1–1.0)
Monocytes Relative: 7 %
Neutro Abs: 11.6 10*3/uL — ABNORMAL HIGH (ref 1.7–7.7)
Neutrophils Relative %: 88 %
Platelets: 283 10*3/uL (ref 150–400)
RBC: 3.61 MIL/uL — ABNORMAL LOW (ref 3.87–5.11)
RDW: 14.6 % (ref 11.5–15.5)
WBC: 13.2 10*3/uL — ABNORMAL HIGH (ref 4.0–10.5)
nRBC: 0 % (ref 0.0–0.2)

## 2022-12-17 LAB — URINE DRUG SCREEN, QUALITATIVE (ARMC ONLY)
Amphetamines, Ur Screen: NOT DETECTED
Barbiturates, Ur Screen: NOT DETECTED
Benzodiazepine, Ur Scrn: POSITIVE — AB
Cannabinoid 50 Ng, Ur ~~LOC~~: POSITIVE — AB
Cocaine Metabolite,Ur ~~LOC~~: POSITIVE — AB
MDMA (Ecstasy)Ur Screen: NOT DETECTED
Methadone Scn, Ur: NOT DETECTED
Opiate, Ur Screen: NOT DETECTED
Phencyclidine (PCP) Ur S: NOT DETECTED
Tricyclic, Ur Screen: POSITIVE — AB

## 2022-12-17 LAB — GLUCOSE, CAPILLARY
Glucose-Capillary: 124 mg/dL — ABNORMAL HIGH (ref 70–99)
Glucose-Capillary: 99 mg/dL (ref 70–99)
Glucose-Capillary: 111 mg/dL — ABNORMAL HIGH (ref 70–99)
Glucose-Capillary: 72 mg/dL (ref 70–99)
Glucose-Capillary: 89 mg/dL (ref 70–99)

## 2022-12-17 LAB — COMPREHENSIVE METABOLIC PANEL
ALT: 12 U/L (ref 0–44)
AST: 23 U/L (ref 15–41)
Albumin: 3.6 g/dL (ref 3.5–5.0)
Alkaline Phosphatase: 87 U/L (ref 38–126)
Anion gap: 11 (ref 5–15)
BUN: 17 mg/dL (ref 8–23)
CO2: 24 mmol/L (ref 22–32)
Calcium: 8.3 mg/dL — ABNORMAL LOW (ref 8.9–10.3)
Chloride: 99 mmol/L (ref 98–111)
Creatinine, Ser: 0.75 mg/dL (ref 0.44–1.00)
GFR, Estimated: >60 mL/min (ref >60)
Glucose, Bld: 154 mg/dL — ABNORMAL HIGH (ref 70–99)
Potassium: 2.3 mmol/L — CL (ref 3.5–5.1)
Sodium: 134 mmol/L — ABNORMAL LOW (ref 135–145)
Total Bilirubin: 1 mg/dL (ref 0.3–1.2)
Total Protein: 7.2 g/dL (ref 6.5–8.1)

## 2022-12-17 LAB — BLOOD GAS, ARTERIAL
Acid-Base Excess: 1 mmol/L (ref 0.0–2.0)
Bicarbonate: 26 mmol/L (ref 20.0–28.0)
FIO2: 80 %
MECHVT: 350 mL
Mechanical Rate: 16
O2 Saturation: 98.8 %
PEEP: 5 cmH2O
Patient temperature: 37
pCO2 arterial: 42 mmHg (ref 32–48)
pH, Arterial: 7.4 (ref 7.35–7.45)
pO2, Arterial: 323 mmHg — ABNORMAL HIGH (ref 83–108)

## 2022-12-17 LAB — URINALYSIS, ROUTINE W REFLEX MICROSCOPIC
Bilirubin Urine: NEGATIVE
Glucose, UA: NEGATIVE mg/dL
Hgb urine dipstick: NEGATIVE
Ketones, ur: NEGATIVE mg/dL
Leukocytes,Ua: NEGATIVE
Nitrite: NEGATIVE
Protein, ur: 100 mg/dL — AB
Specific Gravity, Urine: 1.018 (ref 1.005–1.030)
pH: 5 (ref 5.0–8.0)

## 2022-12-17 LAB — CBG MONITORING, ED: Glucose-Capillary: 132 mg/dL — ABNORMAL HIGH (ref 70–99)

## 2022-12-17 LAB — HIV ANTIBODY (ROUTINE TESTING W REFLEX): HIV Screen 4th Generation wRfx: NONREACTIVE

## 2022-12-17 LAB — SARS CORONAVIRUS 2 BY RT PCR: SARS Coronavirus 2 by RT PCR: NEGATIVE

## 2022-12-17 LAB — MAGNESIUM: Magnesium: 1.7 mg/dL (ref 1.7–2.4)

## 2022-12-17 LAB — LACTIC ACID, PLASMA
Lactic Acid, Venous: 2.4 mmol/L (ref 0.5–1.9)
Lactic Acid, Venous: 1.2 mmol/L (ref 0.5–1.9)

## 2022-12-17 LAB — MRSA NEXT GEN BY PCR, NASAL: MRSA by PCR Next Gen: NOT DETECTED

## 2022-12-17 LAB — PROCALCITONIN: Procalcitonin: 0.44 ng/mL

## 2022-12-17 LAB — PHOSPHORUS: Phosphorus: 2.4 mg/dL — ABNORMAL LOW (ref 2.5–4.6)

## 2022-12-17 LAB — ETHANOL: Alcohol, Ethyl (B): <10 mg/dL (ref <10)

## 2022-12-17 LAB — POTASSIUM: Potassium: 3.2 mmol/L — ABNORMAL LOW (ref 3.5–5.1)

## 2022-12-17 MED ORDER — MAGNESIUM SULFATE 2 GM/50ML IV SOLN
2.0000 g | Freq: Once | INTRAVENOUS | Status: AC
  Administered 2022-12-17: 2 g via INTRAVENOUS
  Filled 2022-12-17: qty 50

## 2022-12-17 MED ORDER — PROPOFOL 1000 MG/100ML IV EMUL
5.0000 ug/kg/min | INTRAVENOUS | Status: DC
  Administered 2022-12-17 (×2): 40 ug/kg/min via INTRAVENOUS
  Filled 2022-12-17: qty 100

## 2022-12-17 MED ORDER — DOCUSATE SODIUM 50 MG/5ML PO LIQD
100.0000 mg | Freq: Two times a day (BID) | ORAL | Status: DC
  Administered 2022-12-17: 100 mg
  Filled 2022-12-17 (×2): qty 10

## 2022-12-17 MED ORDER — ORAL CARE MOUTH RINSE
15.0000 mL | OROMUCOSAL | Status: DC
  Administered 2022-12-17 (×4): 15 mL via OROMUCOSAL

## 2022-12-17 MED ORDER — LACTATED RINGERS IV BOLUS
1000.0000 mL | Freq: Once | INTRAVENOUS | Status: AC
  Administered 2022-12-17: 1000 mL via INTRAVENOUS

## 2022-12-17 MED ORDER — ENOXAPARIN SODIUM 40 MG/0.4ML IJ SOSY
40.0000 mg | PREFILLED_SYRINGE | INTRAMUSCULAR | Status: DC
  Administered 2022-12-17: 40 mg via SUBCUTANEOUS
  Filled 2022-12-17 (×2): qty 0.4

## 2022-12-17 MED ORDER — FENTANYL 2500MCG IN NS 250ML (10MCG/ML) PREMIX INFUSION
0.0000 ug/h | INTRAVENOUS | Status: DC
  Administered 2022-12-17 (×2): 100 ug/h via INTRAVENOUS
  Filled 2022-12-17: qty 250

## 2022-12-17 MED ORDER — PROPOFOL 1000 MG/100ML IV EMUL
INTRAVENOUS | Status: AC
  Administered 2022-12-17: 20 ug/kg/min via INTRAVENOUS
  Filled 2022-12-17: qty 100

## 2022-12-17 MED ORDER — LEVETIRACETAM IN NACL 1000 MG/100ML IV SOLN: 1000.0000 mg | Freq: Once | INTRAVENOUS | Status: AC

## 2022-12-17 MED ORDER — MIDAZOLAM HCL 2 MG/2ML IJ SOLN: 1.0000 mg | INTRAMUSCULAR | Status: DC | PRN

## 2022-12-17 MED ORDER — POTASSIUM CHLORIDE 10 MEQ/100ML IV SOLN: 10.0000 meq | INTRAVENOUS | Status: DC

## 2022-12-17 MED ORDER — POTASSIUM CHLORIDE 10 MEQ/100ML IV SOLN
10.0000 meq | INTRAVENOUS | Status: AC
  Administered 2022-12-17 (×4): 10 meq via INTRAVENOUS
  Filled 2022-12-17: qty 100

## 2022-12-17 MED ORDER — POTASSIUM CHLORIDE 10 MEQ/100ML IV SOLN
10.0000 meq | INTRAVENOUS | Status: AC
  Administered 2022-12-17 (×4): 10 meq via INTRAVENOUS
  Filled 2022-12-17 (×4): qty 100

## 2022-12-17 MED ORDER — POLYETHYLENE GLYCOL 3350 17 G PO PACK: 17.0000 g | PACK | Freq: Every day | ORAL | Status: DC | PRN

## 2022-12-17 MED ORDER — SODIUM CHLORIDE 0.9 % IV SOLN: 2000.0000 mg | Freq: Once | INTRAVENOUS | Status: DC

## 2022-12-17 MED ORDER — IPRATROPIUM-ALBUTEROL 0.5-2.5 (3) MG/3ML IN SOLN
3.0000 mL | Freq: Four times a day (QID) | RESPIRATORY_TRACT | Status: DC
  Administered 2022-12-17 – 2022-12-18 (×3): 3 mL via RESPIRATORY_TRACT
  Filled 2022-12-17 (×3): qty 3

## 2022-12-17 MED ORDER — ADULT MULTIVITAMIN LIQUID CH: 15.0000 mL | Freq: Every day | ORAL | Status: DC

## 2022-12-17 MED ORDER — ORAL CARE MOUTH RINSE: 15.0000 mL | OROMUCOSAL | Status: DC | PRN

## 2022-12-17 MED ORDER — FENTANYL BOLUS VIA INFUSION
50.0000 ug | INTRAVENOUS | Status: DC | PRN
  Administered 2022-12-17: 100 ug via INTRAVENOUS

## 2022-12-17 MED ORDER — SODIUM CHLORIDE 0.9 % IV SOLN
500.0000 mg | Freq: Once | INTRAVENOUS | Status: AC
  Administered 2022-12-17: 500 mg via INTRAVENOUS
  Filled 2022-12-17: qty 5

## 2022-12-17 MED ORDER — PROPOFOL 1000 MG/100ML IV EMUL: 5.0000 ug/kg/min | INTRAVENOUS | Status: DC

## 2022-12-17 MED ORDER — VITAL AF 1.2 CAL PO LIQD: 1000.0000 mL | ORAL | Status: DC

## 2022-12-17 MED ORDER — NOREPINEPHRINE 4 MG/250ML-% IV SOLN: 2.0000 ug/min | INTRAVENOUS | Status: DC

## 2022-12-17 MED ORDER — LEVETIRACETAM IN NACL 1000 MG/100ML IV SOLN
1000.0000 mg | Freq: Two times a day (BID) | INTRAVENOUS | Status: DC
  Administered 2022-12-17 – 2022-12-18 (×2): 1000 mg via INTRAVENOUS
  Filled 2022-12-17 (×3): qty 100

## 2022-12-17 MED ORDER — SODIUM CHLORIDE 0.9 % IV SOLN
250.0000 mL | INTRAVENOUS | Status: DC
  Administered 2022-12-17: 250 mL via INTRAVENOUS

## 2022-12-17 MED ORDER — PROSOURCE TF20 ENFIT COMPATIBL EN LIQD
60.0000 mL | Freq: Every day | ENTERAL | Status: DC
  Filled 2022-12-17: qty 60

## 2022-12-17 MED ORDER — DOCUSATE SODIUM 100 MG PO CAPS: 100.0000 mg | ORAL_CAPSULE | Freq: Two times a day (BID) | ORAL | Status: DC | PRN

## 2022-12-17 MED ORDER — FENTANYL CITRATE PF 50 MCG/ML IJ SOSY
100.0000 ug | PREFILLED_SYRINGE | Freq: Once | INTRAMUSCULAR | Status: AC
  Administered 2022-12-17: 100 ug via INTRAVENOUS
  Filled 2022-12-17: qty 2

## 2022-12-17 MED ORDER — PROPOFOL 10 MG/ML IV BOLUS
INTRAVENOUS | Status: DC | PRN
  Administered 2022-12-17: 60 mg via INTRAVENOUS
  Administered 2022-12-17: 20 mg via INTRAVENOUS

## 2022-12-17 MED ORDER — LEVETIRACETAM IN NACL 1000 MG/100ML IV SOLN
1000.0000 mg | Freq: Once | INTRAVENOUS | Status: AC
  Administered 2022-12-17: 1000 mg via INTRAVENOUS

## 2022-12-17 MED ORDER — LACTATED RINGERS IV SOLN: INTRAVENOUS | Status: DC

## 2022-12-17 MED ORDER — POTASSIUM CHLORIDE 20 MEQ PO PACK: 40.0000 meq | PACK | Freq: Once | ORAL | Status: DC

## 2022-12-17 MED ORDER — POLYETHYLENE GLYCOL 3350 17 G PO PACK
17.0000 g | PACK | Freq: Every day | ORAL | Status: DC
  Administered 2022-12-17: 17 g
  Filled 2022-12-17 (×2): qty 1

## 2022-12-17 MED ORDER — FAMOTIDINE 20 MG PO TABS
20.0000 mg | ORAL_TABLET | Freq: Two times a day (BID) | ORAL | Status: DC
  Administered 2022-12-17 – 2022-12-18 (×2): 20 mg
  Filled 2022-12-17 (×2): qty 1

## 2022-12-17 MED ORDER — LEVETIRACETAM IN NACL 1500 MG/100ML IV SOLN
1500.0000 mg | Freq: Once | INTRAVENOUS | Status: DC
  Filled 2022-12-17: qty 100

## 2022-12-17 MED ORDER — SODIUM CHLORIDE 0.9 % IV SOLN
3.0000 g | Freq: Four times a day (QID) | INTRAVENOUS | Status: DC
  Administered 2022-12-17 – 2022-12-19 (×9): 3 g via INTRAVENOUS
  Filled 2022-12-17 (×11): qty 8

## 2022-12-17 MED ORDER — LEVETIRACETAM IN NACL 1000 MG/100ML IV SOLN
INTRAVENOUS | Status: AC
  Administered 2022-12-17: 1000 mg via INTRAVENOUS
  Filled 2022-12-17: qty 200

## 2022-12-17 MED ORDER — SUCCINYLCHOLINE CHLORIDE 200 MG/10ML IV SOSY
PREFILLED_SYRINGE | INTRAVENOUS | Status: AC
  Filled 2022-12-17: qty 10

## 2022-12-17 MED ORDER — CHLORHEXIDINE GLUCONATE CLOTH 2 % EX PADS
6.0000 | MEDICATED_PAD | Freq: Every day | CUTANEOUS | Status: DC
  Administered 2022-12-18 – 2022-12-19 (×2): 6 via TOPICAL

## 2022-12-17 MED ORDER — LORAZEPAM 2 MG/ML IJ SOLN
INTRAMUSCULAR | Status: AC
  Filled 2022-12-17: qty 1

## 2022-12-17 MED ORDER — POTASSIUM CHLORIDE 20 MEQ PO PACK
40.0000 meq | PACK | Freq: Once | ORAL | Status: AC
  Administered 2022-12-17: 40 meq
  Filled 2022-12-17: qty 2

## 2022-12-17 MED ORDER — SUCCINYLCHOLINE CHLORIDE 20 MG/ML IJ SOLN
INTRAMUSCULAR | Status: DC | PRN
  Administered 2022-12-17: 150 mg via INTRAVENOUS

## 2022-12-17 MED ORDER — SODIUM CHLORIDE 0.9 % IV SOLN
1.0000 g | Freq: Once | INTRAVENOUS | Status: AC
  Administered 2022-12-17: 1 g via INTRAVENOUS
  Filled 2022-12-17: qty 10

## 2022-12-17 NOTE — ED Notes (Signed)
Omelia Blackwater, fiancee, called with update.

## 2022-12-17 NOTE — ED Notes (Signed)
Called to carelink per MD Smith/rep:Umeeka.Marland Kitchen

## 2022-12-17 NOTE — Progress Notes (Addendum)
Per MD Kasa, plan to extubate. No seizure activty and no transfer at this time to [facility]. This RN to bedside and stopped sedation medications. RT made aware of plan by RN. Cerebell removed at this time,.

## 2022-12-17 NOTE — H&P (Signed)
NAME:  Alison Walker, MRN:  [id number], DOB:  04/20/1961, LOS: 0 ADMISSION DATE:  12/17/2022, CONSULTATION DATE:  12/17/2022 REFERRING MD:  Dr. Katrinka Blazing, CHIEF COMPLAINT:  Seizures   Brief Pt Description / Synopsis:  62 y.o. female admitted with seizures, likely due to cocaine, requiring intubation for airway protection, also with concern for aspiration pneumonia.  History of Present Illness:  Alison Walker is a 62 yo female with a past medical history significant for WPW s/p ablation, colovaginal fistula with intraabdominal abscess, and seizures but not on any antiepileptics, who presents to [facility] ED on 12/17/22 from home via EMS on 12/17/22 for evaluation of seizures. History provided per chart review as patient is intubated and sedated at this time.  EMS was called by the patient's daughter due to suspected seizure activity.  She reported 30 minutes of generalized tonic-clonic activity, which self resolved.  Upon EMS arrival she was postictal, but then developed what EMS reported as lower tonic clonic seizure activity of which she was given 2.5 mg of versed with cessation of the seizure activity.  Upon arrival to the ED she was somnolent and encephalopathic, but was able to arouse and follow some simple commands.  She did admit to cocaine use that could have been mixed in with her cannabis.  ED Course: Medications given: azithromycin & ceftriaxone, 2 L LR bolus, Keppra load, fentanyl & propofol drips, 20 meq K+, succinylcholine Initial Vitals: 97.5, RR 7, Pulse 92, 122/69 & 92% Significant labs: (Labs/ Imaging personally reviewed) EKG Interpretation: Date: 12/17/22, EKG Time: 03:52, Rate: 97, Rhythm: ST, QRS Axis:  normal Intervals: prolonged Qt, ST/T Wave abnormalities: none, Narrative Interpretation: ST with prolonged Qtc Chemistry: Na+:134, K+: 2.3, BUN/Cr.: 17/0.75, Serum CO2/ AG: 24/11 Hematology: WBC: 13.2, Hgb: 10.7,  Lactic 2.4, PCT: 0.44, COVID-19 & Influenza A/B: negative UDS: +benzo,  +cocaine, +marijuana, +TCA ABG post intubation: pH 7.4/pCO2 42/PO2 323/Bicarb 26 CXR 12/17/22: extensive RIGHT lung infiltrates CT head wo contrast 12/17/22: no acute intracranial abnormality Medications given: azithromycin & ceftriaxone, 2 L LR bolus, Keppra load, fentanyl & propofol drips, 20 meq K+, succinylcholine  Following CT, she has another witnessed full tonic clonic episode of which she became apneic, hypoxic with O2 sats into the 40's and cyanotic.  She was intubated for airway protection.  Attempt was made to transfer to [facility] for continuous EEG, however there were beds available.  PCCM consulted for admission for further workup and treatment.  Please see "significant hospital events" section below for full detailed hospital course.  Pertinent  Medical History  Diverticulitis HTN WPW s/p ablation Chronic Pain Polysubstance abuse Tobacco use disorder COPD Colovaginal fistula Seizures (felt by neurology to be driven by substance abuse)  Micro Data:  6/6: Blood culture x2>> 6/6: Tracheal aspirate>> 6/6: COVID-19 PCR>>negative  Antimicrobials:   Anti-infectives (From admission, onward)    Start     Dose/Rate Route Frequency Ordered Stop   12/17/22 0700  Ampicillin-Sulbactam (UNASYN) 3 g in sodium chloride 0.9 % 100 mL IVPB        3 g 200 mL/hr over 30 Minutes Intravenous Every 6 hours 12/17/22 0653     12/17/22 0515  cefTRIAXone (ROCEPHIN) 1 g in sodium chloride 0.9 % 100 mL IVPB        1 g 200 mL/hr over 30 Minutes Intravenous  Once 12/17/22 0514 12/17/22 0604   12/17/22 0515  azithromycin (ZITHROMAX) 500 mg in sodium chloride 0.9 % 250 mL IVPB  500 mg 250 mL/hr over 60 Minutes Intravenous  Once 12/17/22 0514 12/17/22 0749        Significant Hospital Events: Including procedures, antibiotic start and stop dates in addition to other pertinent events   6/6: Presented to ED with seizures,  Required intubation in ED.  Attempted to transfer to [facility]  for continuous EEG, currently no beds.  Plan for PCCM here at [facility] to Admit with Cerebell and Neuro consultation with transfer to [facility] once bed available  Interim History / Subjective:  -Pt currently intubated and sedated -Hemodynamically stable, no vasopressors -Cerebell being applied by nursing staff  Objective   Blood pressure 114/66, pulse 84, temperature (!) 97.5 F (36.4 C), temperature source Rectal, resp. rate 17, weight 66 kg, SpO2 100 %.    Vent Mode: AC FiO2 (%):  [100 %] 100 % Set Rate:  [16 bmp] 16 bmp Vt Set:  [350 mL] 350 mL PEEP:  [5 cmH20] 5 cmH20   Intake/Output Summary (Last 24 hours) at 12/17/2022 0807 Last data filed at 12/17/2022 1610 Gross per 24 hour  Intake 2700 ml  Output --  Net 2700 ml   Filed Weights   12/17/22 0700  Weight: 66 kg    Examination: General: Critically ill-appearing female, laying in bed, intubated and sedated, no acute distress HENT: Atraumatic, normocephalic, neck supple, no JVD, orally intubated Lungs: Coarse breath sounds on the right, mechanical breath sounds to the left, occasionally overbreathing the vent, even Cardiovascular: Regular rate and rhythm, S1-S2, no murmurs, rubs, gallops Abdomen: Soft, nontender, nondistended, no guarding rebound tenderness, bowel sounds positive x 4 Extremities: Normal bulk and tone, no deformities, no edema, good peripheral circulation Neuro: Sedated, currently does not withdraw from pain or follow commands, pupils PERRLA and sluggish at 2 mm bilaterally GU: Foley catheter in place  Resolved Hospital Problem list     Assessment & Plan:   #Intubated for Airway Protection #Concern for Aspiration Pneumonia -Full vent support, implement lung protective strategies -Plateau pressures less than 30 cm H20 -Wean FiO2 & PEEP as tolerated to maintain O2 sats >92% -Follow intermittent Chest X-ray & ABG as needed -Spontaneous Breathing Trials when respiratory parameters met and mental status  permits -Implement VAP Bundle -Prn Bronchodilators -ABX as above  #Meets SIRS Criteria upon presentation (RR 27, Pulse 101, WBC 13.2, Lactic 2.4)  #Sepsis due to Aspiration Pneumonia -Monitor fever curve -Trend WBC's & Procalcitonin -Follow cultures as above -Continue empiric Unasyn pending cultures & sensitivities  #Mild Hyponatremia #Hypokalemia -Monitor I&O's / urinary output -Follow BMP -Ensure adequate renal perfusion -Avoid nephrotoxic agents as able -Replace electrolytes as indicated, Pharmacy following for assistance with electrolyte replacement -IV Fluids  #Seizures, likely due to cocaine #Sedation needs in setting of mechanical ventilation CT Head on presentation negative for acute intracranial abnormality UDS + for cocaine, marijuana, benzodiazepines, TCA ETOH <10 -Maintain a RASS goal of 0 to -1 -Propofol and Fentanyl to maintain RASS goal -Avoid sedating medications as able -Daily wake up assessment -Continue Keppra 1g BID -PRN Versed for breakthrough seizures -Aspiration and Seizure precautions -Obtain formal EEG ~ suggestive of diffuse encephalopathy, nonspecific etiology; NO seizures seen -Transfer to [facility] for continuous EEG when bed is available, in the interim will use Cerebell -Neurology consulted, appreciate input     Best Practice (right click and "Reselect all SmartList Selections" daily)   Diet/type: NPO DVT prophylaxis: LMWH GI prophylaxis: H2B Lines: N/A Foley:  N/A Code Status:  full code Last date of multidisciplinary goals  of care discussion [6/6]  6/6: Will update pt's family when they arrive at bedside.  Labs   CBC: Recent Labs  Lab 12/17/22 0359  WBC 13.2*  NEUTROABS 11.6*  HGB 10.7*  HCT 31.9*  MCV 88.4  PLT 283    Basic Metabolic Panel: Recent Labs  Lab 12/17/22 0359  NA 134*  K 2.3*  CL 99  CO2 24  GLUCOSE 154*  BUN 17  CREATININE 0.75  CALCIUM 8.3*  MG 1.7   GFR: Estimated Creatinine Clearance:  64.2 mL/min (by C-G formula based on SCr of 0.75 mg/dL). Recent Labs  Lab 12/17/22 0359  WBC 13.2*  LATICACIDVEN 2.4*    Liver Function Tests: Recent Labs  Lab 12/17/22 0359  AST 23  ALT 12  ALKPHOS 87  BILITOT 1.0  PROT 7.2  ALBUMIN 3.6   No results for input(s): "LIPASE", "AMYLASE" in the last 168 hours. No results for input(s): "AMMONIA" in the last 168 hours.  ABG No results found for: "PHART", "PCO2ART", "PO2ART", "HCO3", "TCO2", "ACIDBASEDEF", "O2SAT"   Coagulation Profile: No results for input(s): "INR", "PROTIME" in the last 168 hours.  Cardiac Enzymes: No results for input(s): "CKTOTAL", "CKMB", "CKMBINDEX", "TROPONINI" in the last 168 hours.  HbA1C: No results found for: "HGBA1C"  CBG: Recent Labs  Lab 12/17/22 0738  GLUCAP 132*    Review of Systems:   Unable to assess due to intubation/sedation/AMS  Past Medical History:  She,  has a past medical history of Diverticulitis, Hypertension, S/P ablation operation for arrhythmia, and Wolf-Parkinson-White syndrome.   Surgical History:   Past Surgical History:  Procedure Laterality Date   ABDOMINAL SURGERY     CHOLECYSTECTOMY     COLONOSCOPY     COLOSTOMY     COLOSTOMY REVISION       Social History:   reports that she has been smoking cigarettes. She has been smoking an average of .5 packs per day. She has never used smokeless tobacco. She reports that she does not drink alcohol and does not use drugs.   Family History:  Her family history includes Cancer in her mother and another family member.   Allergies Allergies  Allergen Reactions   Ciprofloxacin Rash   Morphine Rash     Home Medications  Prior to Admission medications   Medication Sig Start Date End Date Taking? Authorizing Provider  acetaminophen (TYLENOL) 500 MG tablet Take 500 mg by mouth every 6 (six) hours as needed.   Yes [provider]  albuterol (VENTOLIN HFA) 108 (90 Base) MCG/ACT inhaler Inhale 1-2 puffs into  the lungs every 4 (four) hours as needed.   Yes [provider]  amLODipine (NORVASC) 5 MG tablet Take 5 mg by mouth daily.   Yes [provider]  cyclobenzaprine (FLEXERIL) 5 MG tablet Take 5 mg by mouth 3 (three) times daily as needed.   Yes [provider]  DULoxetine HCl 60 MG CSDR Take 60 mg by mouth daily.   Yes [provider]  gabapentin (NEURONTIN) 800 MG tablet Take 800 mg by mouth 3 (three) times daily.   Yes [provider]  hydrOXYzine (ATARAX) 25 MG tablet Take 25 mg by mouth every 8 (eight) hours as needed. 11/17/21  Yes [provider]  lidocaine (LIDODERM) 5 % Place 1 patch onto the skin daily. 12/15/22  Yes [provider]  NARCAN 4 MG/0.1ML LIQD nasal spray kit Place 1 spray into the nose once. 08/12/22  Yes [provider]  pantoprazole (PROTONIX)  40 MG tablet Take 1 tablet (40 mg total) by mouth daily. 01/04/18 12/17/22 Yes Emily Filbert, MD  tiotropium (SPIRIVA) 18 MCG inhalation capsule Place 18 mcg into inhaler and inhale daily.   Yes [provider]  aspirin EC 81 MG tablet Take 81 mg by mouth daily.    [provider]  clotrimazole-betamethasone (LOTRISONE) cream Apply 1 application. topically 2 (two) times daily. Patient not taking: Reported on 12/17/2022 11/21/21   Felecia Shelling, DPM  diclofenac Sodium (VOLTAREN) 1 % GEL Apply 4 g topically 4 (four) times daily.    [provider]  levETIRAcetam (KEPPRA) 500 MG tablet Take 1 tablet (500 mg total) by mouth 2 (two) times daily. 09/08/22 12/07/22  Chesley Noon, MD  nicotine (NICODERM CQ - DOSED IN MG/24 HOURS) 14 mg/24hr patch Place 1 patch (14 mg total) onto the skin daily as needed (nicotine craving). 12/01/21   Hollice Espy, MD  zolpidem (AMBIEN) 5 MG tablet Take 5 mg by mouth at bedtime as needed. 11/07/21   [provider]     Critical care time: 55 minutes     Harlon Ditty, AGACNP-BC [facility] Prefer epic messenger for cross cover needs If after hours, please call E-link

## 2022-12-17 NOTE — Progress Notes (Signed)
PHARMACY CONSULT NOTE   Pharmacy Consult for Electrolyte Monitoring and Replacement   Recent Labs: Potassium (mmol/L)  Date Value  12/17/2022 2.3 (LL)  04/12/2014 4.8   Magnesium (mg/dL)  Date Value  16/04/9603 1.7  01/07/2013 1.6 (L)   Calcium (mg/dL)  Date Value  54/03/8118 8.3 (L)   Calcium, Total (mg/dL)  Date Value  14/78/2956 9.2   Albumin (g/dL)  Date Value  21/30/8657 3.6  04/12/2014 3.6   Phosphorus (mg/dL)  Date Value  84/69/6295 2.4 (L)   Sodium (mmol/L)  Date Value  12/17/2022 134 (L)  04/12/2014 140     Assessment: 62 year old female admitted to CCU with seizures. Intubated and sedated on fentanyl and propofol. Pharmacy has been consulted to manage electrolytes.   Goal of Therapy:  Electrolytes within normal limits  Plan:  K+ responded to aggressive repletion this morning. K+ 3.2 this afternoon. Will give additional Kcl per tube x 1.  Magnesium within normal limits Follow up electrolytes tomorrow AM    Elliot Gurney, PharmD, BCPS Clinical Pharmacist  12/17/2022 3:50 PM

## 2022-12-17 NOTE — ED Notes (Signed)
RT at [facility], preparing to move to [facility]

## 2022-12-17 NOTE — Progress Notes (Signed)
RT assisted with patient transport from ER to ICU with no complications. Pt transported on the Trilogy vent.

## 2022-12-17 NOTE — Progress Notes (Signed)
Bedside swallow eval performed. Patient passed swallow eval easily with stable and strong mouth/oral control. Will use caution and supervise while taking PO as patient is still falling asleep easily.

## 2022-12-17 NOTE — Progress Notes (Signed)
Radiology called to request retraction of ETT by 1-2cm following reading cxray. MD Arsenio Katz updated by RN and RT to retract when at bedside.

## 2022-12-17 NOTE — ED Notes (Signed)
PT returned from CT making occassional barking noises and biting at air. PT then had full tonic clonic episode with left side, stopped breathing, face turned cyanotic, sats dropped into 40s. RN began suctioning and EDP decided to intubate. IV Ativan given. Seizure activity stopped.

## 2022-12-17 NOTE — Procedures (Addendum)
Patient Name: Alison Walker  MRN: [id number]  Epilepsy Attending: Charlsie Quest  Referring Physician/Provider: Rust-Chester, Cecelia Byars, NP  Duration: 12/17/2022 0913 to 1509  Patient history: 62 y.o. female who presents to the ED for evaluation of Altered Mental Status. Rapid EEG to evaluate for seizure  Level of alertness:lethargic   AEDs during EEG study:   Technical aspects: This EEG was obtained using a 10 lead EEG system positioned circumferentially without any parasagittal coverage (rapid EEG). Computer selected EEG is reviewed as  well as background features and all clinically significant events.  Description: EEG showed continuous generalized polymorphic 3 to 7 Hz theta-delta slowing. Hyperventilation and photic stimulation were not performed.     Rapid EEG was disconnected between 12/17/2022 1025 to 1149.   ABNORMALITY - Continuous slow, generalized  IMPRESSION: This rapid EEG is suggestive of severe diffuse encephalopathy, nonspecific etiology. No seizures or epileptiform discharges were seen throughout the recording.  If suspicion for interictal activity remains a concern, a conventional EEG can be considered.   [name] Annabelle Harman

## 2022-12-17 NOTE — Consult Note (Signed)
NEUROLOGY CONSULTATION NOTE   Date of service: December 17, 2022 Patient Name: Alison Walker MRN:  [id number] DOB:  1961/01/09 Reason for consult: seizure Requesting physician: Dr. Erin Fulling _ _ _   _ __   _ __ _ _  __ __   _ __   __ _  History of Present Illness   This is a 62 yo woman with pmhx WPW s/p ablation, HTN, colovaginal fistula with intraabdominal abscesses, seizures not on AEDs who presented to the ED after suspected seizure activity.  History provided by chart review as patient is intubated and sedated dated at this time.  Patient's daughter reported 30 minutes of generalized tonic-clonic activity which resolved without intervention.  Upon EMS arrival she was postictal but then developed additional tonic-clonic seizure activity for which she received 2.5 mg of Versed with cessation of seizure activity.  Upon arrival to the ED she was somnolent and encephalopathic but she was able to be aroused and follows some simple commands.  She did report that cannabis may have had some cocaine mixed in.  CT head showed no acute abnormality.  He was given Keppra, azithromycin, ceftriaxone.  Following CT she had another witnessed generalized tonic-clonic episode and she became apneic with hypoxic sats in the 40s and cyanotic.  She was intubated for airway protection attempts was made to transfer to [facility] for continuous EEG but there were no beds available or she was admitted to [facility].   ROS   UTA 2/2 intubated and sedated  Past History   I have reviewed the following:  Past Medical History:  Diagnosis Date   Diverticulitis    Hypertension    S/P ablation operation for arrhythmia    for WPW   Wolf-Parkinson-White syndrome    Past Surgical History:  Procedure Laterality Date   ABDOMINAL SURGERY     CHOLECYSTECTOMY     COLONOSCOPY     COLOSTOMY     COLOSTOMY REVISION     Family History  Problem Relation Age of Onset   Cancer Mother    Cancer Other    Social History    Socioeconomic History   Marital status: Widowed    Spouse name: Not on file   Number of children: Not on file   Years of education: Not on file   Highest education level: Not on file  Occupational History   Not on file  Tobacco Use   Smoking status: Some Days    Packs/day: .5    Types: Cigarettes   Smokeless tobacco: Never  Substance and Sexual Activity   Alcohol use: No    Alcohol/week: 0.0 standard drinks of alcohol   Drug use: No   Sexual activity: Never  Other Topics Concern   Not on file  Social History Narrative   Not on file   Social Determinants of Health   Financial Resource Strain: Not on file  Food Insecurity: Not on file  Transportation Needs: Not on file  Physical Activity: Not on file  Stress: Not on file  Social Connections: Not on file   Allergies  Allergen Reactions   Ciprofloxacin Rash   Morphine Rash    Medications   Medications Prior to Admission  Medication Sig Dispense Refill Last Dose   acetaminophen (TYLENOL) 500 MG tablet Take 500 mg by mouth every 6 (six) hours as needed.   unk   albuterol (VENTOLIN HFA) 108 (90 Base) MCG/ACT inhaler Inhale 1-2 puffs into the lungs every 4 (four) hours  as needed.   unk   amLODipine (NORVASC) 5 MG tablet Take 5 mg by mouth daily.      cyclobenzaprine (FLEXERIL) 5 MG tablet Take 5 mg by mouth 3 (three) times daily as needed.   unk   DULoxetine HCl 60 MG CSDR Take 60 mg by mouth daily.      gabapentin (NEURONTIN) 800 MG tablet Take 800 mg by mouth 3 (three) times daily.      hydrOXYzine (ATARAX) 25 MG tablet Take 25 mg by mouth every 8 (eight) hours as needed.   unk   lidocaine (LIDODERM) 5 % Place 1 patch onto the skin daily.      NARCAN 4 MG/0.1ML LIQD nasal spray kit Place 1 spray into the nose once.   unk   pantoprazole (PROTONIX) 40 MG tablet Take 1 tablet (40 mg total) by mouth daily. 30 tablet 1    tiotropium (SPIRIVA) 18 MCG inhalation capsule Place 18 mcg into inhaler and inhale daily.       aspirin EC 81 MG tablet Take 81 mg by mouth daily.      clotrimazole-betamethasone (LOTRISONE) cream Apply 1 application. topically 2 (two) times daily. (Patient not taking: Reported on 12/17/2022) 45 g 3 Completed Course   diclofenac Sodium (VOLTAREN) 1 % GEL Apply 4 g topically 4 (four) times daily.      levETIRAcetam (KEPPRA) 500 MG tablet Take 1 tablet (500 mg total) by mouth 2 (two) times daily. 60 tablet 2    nicotine (NICODERM CQ - DOSED IN MG/24 HOURS) 14 mg/24hr patch Place 1 patch (14 mg total) onto the skin daily as needed (nicotine craving). 28 patch 0    zolpidem (AMBIEN) 5 MG tablet Take 5 mg by mouth at bedtime as needed.         Current Facility-Administered Medications:    0.9 %  sodium chloride infusion, 250 mL, Intravenous, Continuous, Rust-Chester, Britton L, NP, Last Rate: 10 mL/hr at 12/17/22 1039, Infusion Verify at 12/17/22 1039   Ampicillin-Sulbactam (UNASYN) 3 g in sodium chloride 0.9 % 100 mL IVPB, 3 g, Intravenous, Q6H, Kasa, Kurian, MD, Last Rate: 200 mL/hr at 12/17/22 1308, 3 g at 12/17/22 1308   docusate (COLACE) 50 MG/5ML liquid 100 mg, 100 mg, Per Tube, BID, Rust-Chester, Micheline Rough L, NP, 100 mg at 12/17/22 1011   docusate sodium (COLACE) capsule 100 mg, 100 mg, Oral, BID PRN, Rust-Chester, Micheline Rough L, NP   enoxaparin (LOVENOX) injection 40 mg, 40 mg, Subcutaneous, Q24H, Rust-Chester, Micheline Rough L, NP   famotidine (PEPCID) tablet 20 mg, 20 mg, Per Tube, BID, Rust-Chester, Britton L, NP, 20 mg at 12/17/22 1012   [START ON 12/18/2022] feeding supplement (PROSource TF20) liquid 60 mL, 60 mL, Per Tube, Daily, Kasa, Kurian, MD   feeding supplement (VITAL AF 1.2 CAL) liquid 1,000 mL, 1,000 mL, Per Tube, Q24H, Kasa, Kurian, MD   fentaNYL (SUBLIMAZE) bolus via infusion 50-100 mcg, 50-100 mcg, Intravenous, Q15 min PRN, Rust-Chester, Britton L, NP, 100 mcg at 12/17/22 0746   fentaNYL in NS (3mcg/ml) infusion-PREMIX, 0-200 mcg/hr, Intravenous, Continuous, Rust-Chester,  Britton L, NP, Last Rate: 15 mL/hr at 12/17/22 1039, 150 mcg/hr at 12/17/22 1039   lactated ringers infusion, , Intravenous, Continuous, Harlon Ditty D, NP, Last Rate: 75 mL/hr at 12/17/22 1039, Infusion Verify at 12/17/22 1039   levETIRAcetam (KEPPRA) IVPB 1000 mg/100 mL premix, 1,000 mg, Intravenous, Q12H, Rust-Chester, Britton L, NP   midazolam (VERSED) injection 1-2 mg, 1-2 mg, Intravenous, Q1H PRN, Rust-Chester, Cecelia Byars,  NP   [START ON 12/18/2022] multivitamin liquid 15 mL, 15 mL, Per Tube, Daily, Kasa, Kurian, MD   norepinephrine (LEVOPHED) 4mg  in (0.016 mg/mL) premix infusion, 2-10 mcg/min, Intravenous, Titrated, Rust-Chester, Micheline Rough L, NP   Oral care mouth rinse, 15 mL, Mouth Rinse, Q2H, Kasa, Kurian, MD, 15 mL at 12/17/22 1210   Oral care mouth rinse, 15 mL, Mouth Rinse, PRN, Belia Heman, Kurian, MD   polyethylene glycol (MIRALAX / GLYCOLAX) packet 17 g, 17 g, Per Tube, Daily PRN, Rust-Chester, Micheline Rough L, NP   polyethylene glycol (MIRALAX / GLYCOLAX) packet 17 g, 17 g, Per Tube, Daily, Rust-Chester, Britton L, NP, 17 g at 12/17/22 1011   propofol (DIPRIVAN) 1000 MG/100ML infusion, 5-80 mcg/kg/min, Intravenous, Titrated, Drusilla Kanner, RPH, Last Rate: 15.84 mL/hr at 12/17/22 1039, 40 mcg/kg/min at 12/17/22 1039  Vitals   Vitals:   12/17/22 0900 12/17/22 1000 12/17/22 1224 12/17/22 1234  BP: 110/67 102/69    Pulse: 73 67    Resp: 19 14    Temp:      TempSrc:      SpO2: 100% 100% 100%   Weight:      Height:    5\' 2"  (1.575 m)     Body mass index is 26.61 kg/m.  Physical Exam   Gen: patient lying in bed, intubated, sedated CV: extremities appear well-perfused Resp: ventilated  Neurologic exam MS: unable to answer orientation questions 2/2 ETT, follows simple commands Speech: intubated CN: PERRL, (+) corneals, EOMI, cough, gag; face symmetric, hearing intact to voice Motor: raises all extremities anti-gravity to command Sensory: SILT Reflexes: 2+ symm with toes down  bilat Coordination: UTA Gait: deferred   Labs   CBC:  Recent Labs  Lab 12/17/22 0359  WBC 13.2*  NEUTROABS 11.6*  HGB 10.7*  HCT 31.9*  MCV 88.4  PLT 283    Basic Metabolic Panel:  Lab Results  Component Value Date   NA 134 (L) 12/17/2022   K 2.3 (LL) 12/17/2022   CO2 24 12/17/2022   GLUCOSE 154 (H) 12/17/2022   BUN 17 12/17/2022   CREATININE 0.75 12/17/2022   CALCIUM 8.3 (L) 12/17/2022   GFRNONAA >60 12/17/2022   GFRAA >60 02/27/2020   Lipid Panel:  Lab Results  Component Value Date   LDLCALC 94 03/13/2011   HgbA1c: No results found for: "HGBA1C" Urine Drug Screen:     Component Value Date/Time   LABOPIA NONE DETECTED 12/17/2022 0359   LABOPIA NONE DETECTED 03/11/2011 1718   COCAINSCRNUR POSITIVE (A) 12/17/2022 0359   LABBENZ POSITIVE (A) 12/17/2022 0359   LABBENZ POSITIVE (A) 03/11/2011 1718   AMPHETMU NONE DETECTED 12/17/2022 0359   AMPHETMU NONE DETECTED 03/11/2011 1718   THCU POSITIVE (A) 12/17/2022 0359   THCU POSITIVE (A) 03/11/2011 1718   LABBARB NONE DETECTED 12/17/2022 0359   LABBARB NONE DETECTED 03/11/2011 1718    Alcohol Level     Component Value Date/Time   ETH <10 12/17/2022 0359    CT Head without contrast: No acute process on personal review  rEEG: mild DS on sedation, no epileptiform abnl  Impression   This is a 62 yo woman with pmhx WPW s/p ablation, HTN, colovaginal fistula with intraabdominal abscesses, seizures not on AEDs who presented to the ED with multiple seizures ultimately requiring intubation for airway protection. Cocaine (+) on admission. She has had prior seizures per report but it is unclear if these occurred in the setting of substance abuse. She is currently on sedation but following commands and  had no seizures on 4 hr ceribell recording and spot EEG.  Recommendations   - OK to wean to extubation if patient tolerates - Continue keppra 1000mg  bid - IV ativan 2mg  prn for seizure activity - Seizure  precautions - Substance abuse counseling - MRI brain wwo - Will continue to follow  This patient is critically ill and at significant risk of neurological worsening, death and care requires constant monitoring of vital signs, hemodynamics,respiratory and cardiac monitoring, neurological assessment, discussion with family, other specialists and medical decision making of high complexity. I spent 60 minutes of neurocritical care time  in the care of  this patient. This was time spent independent of any time provided by nurse practitioner or PA.  Bing Neighbors, MD [facility] [phone]  If 7pm- 7am, please page neurology on call as listed in AMION.

## 2022-12-17 NOTE — ED Triage Notes (Signed)
From home. Family reports 30 mins of seizure activity. EMS arrived and she was having lower tonic clonic activity. 2.5 versed given and activity stopped. A&Ox 2.

## 2022-12-17 NOTE — Progress Notes (Signed)
Pharmacy Antibiotic Note  Alison Walker is a 62 y.o. female admitted on 12/17/2022 with aspiration PNA.  Pharmacy has been consulted for Unasyn dosing.  Plan: SrCr = 0.75  Unasyn 3 gm IV Q6H ordered to start on 6/6 @ 0700.      Temp (24hrs), Avg:97.5 F (36.4 C), Min:97.5 F (36.4 C), Max:97.5 F (36.4 C)  Recent Labs  Lab 12/17/22 0359  WBC 13.2*  CREATININE 0.75  LATICACIDVEN 2.4*    CrCl cannot be calculated (Unknown ideal weight.).    Allergies  Allergen Reactions   Ciprofloxacin Rash   Morphine Rash    Antimicrobials this admission:   >>    >>   Dose adjustments this admission:   Microbiology results:  BCx:   UCx:    Sputum:    MRSA PCR:   Thank you for allowing pharmacy to be a part of this patient's care.  Corrine Tillis D 12/17/2022 6:53 AM

## 2022-12-17 NOTE — Procedures (Signed)
Routine EEG Report  Alison Walker is a 62 y.o. female with a history of seizure who is undergoing an EEG to evaluate for seizures.  Report: This EEG was acquired with electrodes placed according to the International 10-20 electrode system (including Fp1, Fp2, F3, F4, C3, C4, P3, P4, O1, O2, T3, T4, T5, T6, A1, A2, Fz, Cz, Pz). The following electrodes were missing or displaced: none.  The best background was 6-7 Hz with overriding beta frequencies and brief intervening periods of suppression lasting 1-2 seconds. No clear waking rhythm or sleep architecture was identified. There was no focal slowing. There were no interictal epileptiform discharges. There were no electrographic seizures identified.   Impression and clinical correlation: This EEG was obtained while sedated on propofol and fentanyl and is abnormal due to: - mild diffuse slowing indicative of global cerebral dysfunction, medication effect, or both - brief intervening periods of suppression likely due to medication effect   Bing Neighbors, MD [facility] [phone]  If 7pm- 7am, please page neurology on call as listed in AMION.

## 2022-12-17 NOTE — Progress Notes (Signed)
ETT pulled back to 21 at lip per CXR results per Radiologist

## 2022-12-17 NOTE — ED Notes (Signed)
Fentanyl given IVP due to pt biting tube- sats dropping. ETT tube possibly displaced. EDP at bedside to assess. Vent disconnected and pt bagged. ETT tube adjusted and PT reconnected to vent. Portable at bedside to perform xray. NG tube dropped.

## 2022-12-17 NOTE — Progress Notes (Signed)
Initial Nutrition Assessment  DOCUMENTATION CODES:   Not applicable  INTERVENTION:   Vital 1.2@40ml /hr- initiate at 26ml/hr and increase by 47ml/hr q 12 hours until goal rate is reached.   ProSource TF 20- Give 60ml daily via tube, each supplement provides 80kcal and 20g of protein.   Free water flushes 30ml q4 hours to maintain tube patency   Regimen provides 1232kcal/day, 92g/day protein and 936ml/day of free water.   Liquid MVI daily via tube   Pt at high refeed risk; recommend monitor potassium, magnesium and phosphorus labs daily until stable  Daily weights   Check B12 level  NUTRITION DIAGNOSIS:   Inadequate oral intake related to inability to eat (pt sedated and ventilated) as evidenced by NPO status.  GOAL:   Provide needs based on ASPEN/SCCM guidelines  MONITOR:   Vent status, Labs, Weight trends, TF tolerance, Skin, I & O's  REASON FOR ASSESSMENT:   Ventilator    ASSESSMENT:   62 y/o female with h/o anxiety, depression, kidney stones, chronic pain, substance abuse, COPD, MI, seizures, HTN, HLD, GERD, Wolff-Parkinson-White (WPW) syndrome, ovarian tumors s/p resection, diverticulitis s/p partial colectomy with colostomy and later takedown (1990's?) complicated by rectovaginal fistula (s/p partial vaginectomy, rectus abdominus muscle rotational flap 2000) and ventral hernia (s/p repair with mesh 2002 complicated by gangrene and abdominal infection & EC fistula requiring mesh removal in 2010), recurrent ventral hernia complicated by strictured colorectal anastomosis with chronic partial obstruction (s/p ventral hernia repair with mesh placement 2016), clostridium difficile colitis with ischemic colitis (2013), anastomotic-vaginal fistula (s/p open LOA, small bowel resection x3 (with removal of 28cm of jejunum and 21cm distal ileum), colorectal anastamosis resection and vaginal fistula closure 2021) and who is now admitted with seizures and AMS requiring intubation and  ventilated.  Pt sedated and ventilated. OGT in place. Plan is to initiate tube feeds today. Pt is likely at refeed risk. Per chart, pt appears weight stable pta. Pt with h/o distal ileum resection; will check B12 levels.   Medications reviewed and include: colace, lovenox, pepcid, miralax, unasyn, LRS @75ml /hr, propofol   Labs reviewed: Na 134(L), K 2.3(L), P 2.4(L), Mg 1.7 wnl Wbc- 13.2(H), Hgb 10.7(L), Hct 31.9(L) Cbgs- 99, 124, 132 x 24 hrs   Patient is currently intubated on ventilator support MV: 5.5 L/min Temp (24hrs), Avg:97.5 F (36.4 C), Min:97.5 F (36.4 C), Max:97.5 F (36.4 C)  Propofol: 15.84 ml/hr- provides 418kcal/day   MAP- >56mmHg   UOP-   NUTRITION - FOCUSED PHYSICAL EXAM:  Flowsheet Row Most Recent Value  Orbital Region Mild depletion  Upper Arm Region Moderate depletion  Thoracic and Lumbar Region Mild depletion  Buccal Region No depletion  Temple Region Mild depletion  Clavicle Bone Region Mild depletion  Clavicle and Acromion Bone Region Mild depletion  Scapular Bone Region No depletion  Dorsal Hand Unable to assess  Patellar Region No depletion  Anterior Thigh Region No depletion  Posterior Calf Region No depletion  Edema (RD Assessment) None  Hair Reviewed  Eyes Reviewed  Mouth Reviewed  Skin Reviewed  Nails Reviewed   Diet Order:   Diet Order             Diet NPO time specified  Diet effective now                  EDUCATION NEEDS:   No education needs have been identified at this time  Skin:  Skin Assessment: Reviewed RN Assessment (ecchymosis)  Last BM:  pta  Height:  Ht Readings from Last 1 Encounters:  12/17/22 5\' 2"  (1.575 m)    Weight:   Wt Readings from Last 1 Encounters:  12/17/22 66 kg    Ideal Body Weight:  50 kg  BMI:  Body mass index is 26.61 kg/m.  Estimated Nutritional Needs:   Kcal:  1172kcal/day  Protein:  85-100g/day  Fluid:  1.6-1.8L/day  Betsey Holiday MS, RD, LDN Please refer  to [facility] for RD and/or RD on-call/weekend/after hours pager

## 2022-12-17 NOTE — Progress Notes (Signed)
Eeg done 

## 2022-12-17 NOTE — Progress Notes (Signed)
Patient  Alison Walker in ICU 5 transferred to [facility] service tomorrow 6/7. She is a 62 yo female with PMHx of WPW s/p ablation, HTN, colovaginal fistula with intraabdominal abscesses, seizures not on AEDs who presented to the ED with multiple seizures ultimately requiring intubation for airway protection and aspiration pneumonia. Cocaine (+) on admission. She has had prior seizures per report but it is unclear if these occurred in the setting of substance abuse. Neuro following, had no seizures on 4 hr ceribell recording and spot EEG. Successfully extubated this afternoon, hemodynamically stable, not on vasopressors.

## 2022-12-17 NOTE — Procedures (Signed)
Extubation Procedure Note  Patient Details:   Name: Alison Walker DOB: [birth year] MRN: [id number]   Airway Documentation:    Vent end date: 12/17/22 Vent end time: 1538   Evaluation  O2 sats: stable throughout Complications: No apparent complications Patient did tolerate procedure well. Bilateral Breath Sounds: Clear, Diminished   Yes able to cough and speak.  Extubated to room air.  Ronda Fairly Kerria Sapien 12/17/2022, 3:45 PM

## 2022-12-17 NOTE — Progress Notes (Signed)
Patient admitted to ICU from ED. ED RN transported to ICU with [facility]. This RN, CN RN Charli at bedside for admission. Ceribell applied, foley inserted, additional K runs administered. Fiance updated to transfer of patient to ICU via phone conversation. EEG tech at bedside to perform EEG. Ceribell removed and will be reapplied after completion. Repeat Cxray ordered to confirm proper placement of ETT. Pending results prior to retraction.

## 2022-12-17 NOTE — ED Provider Notes (Signed)
[facility] Provider Note    Event Date/Time   First MD Initiated Contact with Patient 12/17/22 [phone]     (approximate)   History   Altered Mental Status   HPI  Alison Walker is a 62 y.o. female who presents to the ED for evaluation of Altered Mental Status   Review 5/22 medical DC summary.  History of WPW s/p ablation, colovaginal fistula and intra-abdominal abscess, history of seizure in the past not on any medications.  She was admitted for seizure activity, and I reviewed his neurology consultation while she was admitted.  Patient presents to the ED for evaluation of seizure-like activity at home.  EMS reports family noted 30 minutes of generalized tonic-clonic activity that self resolved.  EMS reports she was postictal on their arrival, but she had some bilateral leg seizure-like activity while she was somnolent so they provided intravenous Versed.  Patient is unable to provide much relevant history as she is encephalopathic and possibly postictal.  She does admit to cocaine use that could be mixed with her cannabis use.  No trauma or recent illnesses  Physical Exam   Triage Vital Signs: ED Triage Vitals [12/17/22 0349]  Enc Vitals Group     BP      Pulse      Resp      Temp      Temp src      SpO2      Weight      Height      Head Circumference      Peak Flow      Pain Score 0     Pain Loc      Pain Edu?      Excl. in GC?     Most recent vital signs: Vitals:   12/17/22 0640 12/17/22 0650  BP: (!) 90/53 (!) 89/55  Pulse: 77 77  Resp: 19 19  Temp:    SpO2: 99% 100%    General: Somnolent, follows simple commands specifically bilateral toes are showing thumbs.  Symmetric bilaterally CV:  Good peripheral perfusion.  Resp:  Normal effort.  Abd:  No distention.  Soft MSK:  No deformity noted.  No signs of trauma Neuro:  No focal deficits appreciated.   Other:     ED Results / Procedures / Treatments   Labs (all labs ordered are  listed, but only abnormal results are displayed) Labs Reviewed  COMPREHENSIVE METABOLIC PANEL - Abnormal; Notable for the following components:      Result Value   Sodium 134 (*)    Potassium 2.3 (*)    Glucose, Bld 154 (*)    Calcium 8.3 (*)    All other components within normal limits  CBC WITH DIFFERENTIAL/PLATELET - Abnormal; Notable for the following components:   WBC 13.2 (*)    RBC 3.61 (*)    Hemoglobin 10.7 (*)    HCT 31.9 (*)    Neutro Abs 11.6 (*)    Lymphs Abs 0.6 (*)    Abs Immature Granulocytes 0.11 (*)    All other components within normal limits  URINALYSIS, ROUTINE W REFLEX MICROSCOPIC - Abnormal; Notable for the following components:   Color, Urine YELLOW (*)    APPearance HAZY (*)    Protein, ur 100 (*)    Bacteria, UA MANY (*)    All other components within normal limits  URINE DRUG SCREEN, QUALITATIVE ([facility] ONLY) - Abnormal; Notable for the following components:   Tricyclic, Ur  Screen POSITIVE (*)    Cocaine Metabolite,Ur [place] POSITIVE (*)    Cannabinoid 50 Ng, Ur [place] POSITIVE (*)    Benzodiazepine, Ur Scrn POSITIVE (*)    All other components within normal limits  LACTIC ACID, PLASMA - Abnormal; Notable for the following components:   Lactic Acid, Venous 2.4 (*)    All other components within normal limits  CULTURE, BLOOD (ROUTINE X 2)  CULTURE, BLOOD (ROUTINE X 2)  SARS CORONAVIRUS 2 BY RT PCR  CULTURE, RESPIRATORY W GRAM STAIN  MAGNESIUM  ETHANOL  LACTIC ACID, PLASMA  PROCALCITONIN  HIV ANTIBODY (ROUTINE TESTING W REFLEX)  BLOOD GAS, ARTERIAL  POTASSIUM    EKG Sinus rhythm with a rate of 97 bpm.  Normal axis.  Prolonged QTc of 533.  No STEMI.  RADIOLOGY 1 view CXR interpreted by me with right-sided multifocal infiltrates 1 view CXR interpreted by me with ETT that we withdraw couple centimeters CT head interpreted by me without evidence of acute intracranial pathology  Official radiology report(s): DG Chest Portable 1 View  Result Date:  12/17/2022 CLINICAL DATA:  Status post intubation. EXAM: PORTABLE CHEST 1 VIEW COMPARISON:  12/17/2022 FINDINGS: The endotracheal tube tip is in the right mainstem bronchus. Advise withdrawing by at least 4.5 cm. Enteric tube tip and side port are in the stomach. Stable cardiomediastinal contours. Bilateral scratch set diffuse airspace disease within the right lung is unchanged from previous exam. New patchy opacities are noted within the left upper lobe and left lower lobe. Visualized osseous structures appear grossly intact. IMPRESSION: 1. Endotracheal tube tip is in the right mainstem bronchus. Advise withdrawing by at least 4.5 cm. 2. Worsening aeration to the left lung compared with previous exam. Persistent diffuse airspace disease throughout the right lung. Electronically Signed   By: Signa Kell M.D.   On: 12/17/2022 07:13   CT HEAD WO CONTRAST ( )  Result Date: 12/17/2022 CLINICAL DATA:  Seizure-like activity EXAM: CT HEAD WITHOUT CONTRAST TECHNIQUE: Contiguous axial images were obtained from the base of the skull through the vertex without intravenous contrast. RADIATION DOSE REDUCTION: This exam was performed according to the departmental dose-optimization program which includes automated exposure control, adjustment of the mA and/or kV according to patient size and/or use of iterative reconstruction technique. COMPARISON:  11/29/2021 FINDINGS: Brain: No evidence of acute infarction, hemorrhage, hydrocephalus, extra-axial collection or mass lesion/mass effect. Mild cerebral volume loss. Vascular: No hyperdense vessel or unexpected calcification. Skull: Normal. Negative for fracture or focal lesion. Sinuses/Orbits: No acute finding. IMPRESSION: No acute finding or correlate for seizure history. Electronically Signed   By: Tiburcio Pea M.D.   On: 12/17/2022 06:23   DG Chest Portable 1 View  Result Date: 12/17/2022 CLINICAL DATA:  eval aspiration EXAM: PORTABLE CHEST - 1 VIEW COMPARISON:   11/30/2021 FINDINGS: Extensive patchy airspace opacities throughout the right lung, new since previous. Left lung remains clear. Heart size and mediastinal contours are within normal limits. No effusion. Visualized bones unremarkable. Cholecystectomy clips. IMPRESSION: Extensive right lung infiltrates. Electronically Signed   By: Corlis Leak M.D.   On: 12/17/2022 06:09    PROCEDURES and INTERVENTIONS:  .1-3 Lead EKG Interpretation  Performed by: Delton Prairie, MD Authorized by: Delton Prairie, MD     Interpretation: normal     ECG rate:  88   ECG rate assessment: normal     Rhythm: sinus rhythm     Ectopy: none     Conduction: normal   .Critical Care  Performed by: Katrinka Blazing,  Domingo Dimes, MD Authorized by: Delton Prairie, MD   Critical care provider statement:    Critical care time (minutes):  30   Critical care time was exclusive of:  Separately billable procedures and treating other patients   Critical care was necessary to treat or prevent imminent or life-threatening deterioration of the following conditions:  Respiratory failure and CNS failure or compromise   Critical care was time spent personally by me on the following activities:  Development of treatment plan with patient or surrogate, discussions with consultants, evaluation of patient's response to treatment, examination of patient, ordering and review of laboratory studies, ordering and review of radiographic studies, ordering and performing treatments and interventions, pulse oximetry, re-evaluation of patient's condition and review of old charts Procedure Name: Intubation Date/Time: 12/17/2022 7:31 AM  Performed by: Delton Prairie, MDPre-anesthesia Checklist: Patient identified, Patient being monitored, Emergency Drugs available, Timeout performed and Suction available Oxygen Delivery Method: Non-rebreather mask Preoxygenation: Pre-oxygenation with 100% oxygen Induction Type: Rapid sequence Ventilation: Mask ventilation without  difficulty Laryngoscope Size: Glidescope and 3 Tube size: 7.5 mm Number of attempts: 1 Airway Equipment and Method: Rigid stylet Placement Confirmation: ETT inserted through vocal cords under direct vision, CO2 detector and Breath sounds checked- equal and bilateral Secured at: 23 cm Tube secured with: ETT holder      Medications  potassium chloride 10 mEq in 100 mL IVPB (10 mEq Intravenous New Bag/Given 12/17/22 0633)  propofol (DIPRIVAN) 10 mg/mL bolus/IV push (20 mg Intravenous Given 12/17/22 0551)  succinylcholine (ANECTINE) injection ( Intravenous Canceled Entry 12/17/22 0600)  fentaNYL in NS (74mcg/ml) infusion-PREMIX (100 mcg/hr Intravenous Bolus 12/17/22 0625)  docusate sodium (COLACE) capsule 100 mg (has no administration in time range)  polyethylene glycol (MIRALAX / GLYCOLAX) packet 17 g (has no administration in time range)  docusate (COLACE) 50 MG/5ML liquid 100 mg (has no administration in time range)  polyethylene glycol (MIRALAX / GLYCOLAX) packet 17 g (has no administration in time range)  enoxaparin (LOVENOX) injection 40 mg (has no administration in time range)  famotidine (PEPCID) tablet 20 mg (has no administration in time range)  fentaNYL (SUBLIMAZE) bolus via infusion 50-100 mcg (has no administration in time range)  midazolam (VERSED) injection 1-2 mg (has no administration in time range)  levETIRAcetam (KEPPRA) IVPB 1000 mg/100 mL premix (has no administration in time range)  magnesium sulfate IVPB 2 g 50 mL (2 g Intravenous New Bag/Given 12/17/22 0720)  0.9 %  sodium chloride infusion (has no administration in time range)  norepinephrine (LEVOPHED) 4mg  in (0.016 mg/mL) premix infusion (has no administration in time range)  Ampicillin-Sulbactam (UNASYN) 3 g in sodium chloride 0.9 % 100 mL IVPB (has no administration in time range)  propofol (DIPRIVAN) 1000 MG/100ML infusion (has no administration in time range)  lactated ringers bolus 1,000 mL (0 mLs  Intravenous Stopped 12/17/22 0603)  cefTRIAXone (ROCEPHIN) 1 g in sodium chloride 0.9 % 100 mL IVPB (0 g Intravenous Stopped 12/17/22 0604)  azithromycin (ZITHROMAX) 500 mg in sodium chloride 0.9 % 250 mL IVPB (500 mg Intravenous New Bag/Given 12/17/22 0602)  fentaNYL (SUBLIMAZE) injection 100 mcg (100 mcg Intravenous Given 12/17/22 0612)  levETIRAcetam (KEPPRA) IVPB 1000 mg/100 mL premix (0 mg Intravenous Stopped 12/17/22 0600)  levETIRAcetam (KEPPRA) IVPB 1000 mg/100 mL premix (0 mg Intravenous Stopped 12/17/22 0601)  lactated ringers bolus 1,000 mL (0 mLs Intravenous Stopped 12/17/22 0719)  potassium chloride (KLOR-CON) packet 40 mEq (40 mEq Per Tube Given 12/17/22 0729)     IMPRESSION /  MDM / ASSESSMENT AND PLAN / ED COURSE  I reviewed the triage vital signs and the nursing notes.  Differential diagnosis includes, but is not limited to, status epilepticus, polysubstance abuse, sepsis  {Patient presents with symptoms of an acute illness or injury that is potentially life-threatening.  Patient presents from home presumably postictal after witnessed each seizure-like activity at home.  She is maintaining her airway and postictal on arrival biting her Norco.  This rehab demonstrates evidence of sepsis from pneumonia, possibly aspiration pneumonia.  She started on IV antibiotics.  She has noted to have recurrent seizure activity before returning to baseline concerning for status epilepticus.  She is intubated for airway protection.  I attempt to transfer to [facility] did not have any beds recommend keep the patient here for now and initiate treatment and workup in a facility with spot EEG and neurology evaluation with plans to transfer ASAP.  I consulted my ICU NP, who asks me to reach out to the neurologist at [facility] for clearance to get this EEG, and I speak with Dr. Amada Jupiter who is agreeable.  Clinical Course as of 12/17/22 0732  Thu Dec 17, 2022  0358 Reassessed.  When asked about cocaine use,  she reports that it "might have been in the joint." [DS]  0542 Called to the bedside for seizure activity. Stops before I get there. Postictal  [DS]  0559 reassessed [DS]  0612 I speak with Dr. Johny Drilling in the ICU. Would accept but no ICU beds. Agrees pt needs transfer for EEG. Recommends I admit to ICU here initially and they can maintain communication with [facility] for transfer. Have Neuro see her here in the daytime.  [DS]    Clinical Course User Index [DS] Delton Prairie, MD     FINAL CLINICAL IMPRESSION(S) / ED DIAGNOSES   Final diagnoses:  Community acquired pneumonia of right lower lobe of lung  Status epilepticus (HCC)     Rx / DC Orders   ED Discharge Orders     None        Note:  This document was prepared using Dragon voice recognition software and may include unintentional dictation errors.   Delton Prairie, MD 12/17/22 [phone]

## 2022-12-17 NOTE — Progress Notes (Signed)
Updated Elfredia Nevins on phone that patient is now extubated. Appreciative of update and plans to visit today.

## 2022-12-17 NOTE — Progress Notes (Signed)
PHARMACY CONSULT NOTE - FOLLOW UP  Pharmacy Consult for Electrolyte Monitoring and Replacement   Recent Labs: Potassium (mmol/L)  Date Value  12/17/2022 2.3 (LL)  04/12/2014 4.8   Magnesium (mg/dL)  Date Value  78/29/5621 1.7  01/07/2013 1.6 (L)   Calcium (mg/dL)  Date Value  30/86/5784 8.3 (L)   Calcium, Total (mg/dL)  Date Value  69/62/9528 9.2   Albumin (g/dL)  Date Value  41/32/4401 3.6  04/12/2014 3.6   Sodium (mmol/L)  Date Value  12/17/2022 134 (L)  04/12/2014 140     Assessment: 6/6 @ 0359:   Ca = 8.3,  Alb = 3.6,  Corrected Ca = 8.6                        K = 2.3  Goal of Therapy:  Electrolytes with WNL   Plan:  KCl 10 mEq IV X 4 ordered to start on 6/6 @ 0500.  - No doses given as of 0700.   Scherrie Gerlach ,PharmD Clinical Pharmacist 12/17/2022 6:50 AM

## 2022-12-18 ENCOUNTER — Inpatient Hospital Stay: Payer: 59

## 2022-12-18 DIAGNOSIS — R6520 Severe sepsis without septic shock: Secondary | ICD-10-CM

## 2022-12-18 DIAGNOSIS — G40901 Epilepsy, unspecified, not intractable, with status epilepticus: Secondary | ICD-10-CM | POA: Diagnosis not present

## 2022-12-18 DIAGNOSIS — I10 Essential (primary) hypertension: Secondary | ICD-10-CM

## 2022-12-18 DIAGNOSIS — A419 Sepsis, unspecified organism: Principal | ICD-10-CM

## 2022-12-18 DIAGNOSIS — E876 Hypokalemia: Secondary | ICD-10-CM

## 2022-12-18 DIAGNOSIS — F1910 Other psychoactive substance abuse, uncomplicated: Secondary | ICD-10-CM

## 2022-12-18 DIAGNOSIS — D638 Anemia in other chronic diseases classified elsewhere: Secondary | ICD-10-CM | POA: Insufficient documentation

## 2022-12-18 DIAGNOSIS — D649 Anemia, unspecified: Secondary | ICD-10-CM

## 2022-12-18 LAB — RENAL FUNCTION PANEL
Albumin: 2.3 g/dL — ABNORMAL LOW (ref 3.5–5.0)
Anion gap: 6 (ref 5–15)
BUN: 7 mg/dL — ABNORMAL LOW (ref 8–23)
CO2: 26 mmol/L (ref 22–32)
Calcium: 7.5 mg/dL — ABNORMAL LOW (ref 8.9–10.3)
Chloride: 104 mmol/L (ref 98–111)
Creatinine, Ser: 0.37 mg/dL — ABNORMAL LOW (ref 0.44–1.00)
GFR, Estimated: >60 mL/min (ref >60)
Glucose, Bld: 93 mg/dL (ref 70–99)
Phosphorus: 2.3 mg/dL — ABNORMAL LOW (ref 2.5–4.6)
Potassium: 3.1 mmol/L — ABNORMAL LOW (ref 3.5–5.1)
Sodium: 136 mmol/L (ref 135–145)

## 2022-12-18 LAB — VITAMIN B12: Vitamin B-12: 221 pg/mL (ref 180–914)

## 2022-12-18 LAB — CBC
HCT: 27.5 % — ABNORMAL LOW (ref 36.0–46.0)
Hemoglobin: 9.2 g/dL — ABNORMAL LOW (ref 12.0–15.0)
MCH: 29.7 pg (ref 26.0–34.0)
MCHC: 33.5 g/dL (ref 30.0–36.0)
MCV: 88.7 fL (ref 80.0–100.0)
Platelets: 272 10*3/uL (ref 150–400)
RBC: 3.1 MIL/uL — ABNORMAL LOW (ref 3.87–5.11)
RDW: 14.9 % (ref 11.5–15.5)
WBC: 6.4 10*3/uL (ref 4.0–10.5)
nRBC: 0 % (ref 0.0–0.2)

## 2022-12-18 LAB — CULTURE, BLOOD (ROUTINE X 2): Culture: NO GROWTH

## 2022-12-18 LAB — GLUCOSE, CAPILLARY
Glucose-Capillary: 134 mg/dL — ABNORMAL HIGH (ref 70–99)
Glucose-Capillary: 87 mg/dL (ref 70–99)

## 2022-12-18 LAB — PROCALCITONIN: Procalcitonin: 0.24 ng/mL

## 2022-12-18 LAB — MAGNESIUM: Magnesium: 1.7 mg/dL (ref 1.7–2.4)

## 2022-12-18 MED ORDER — MAGNESIUM SULFATE 2 GM/50ML IV SOLN
2.0000 g | Freq: Once | INTRAVENOUS | Status: AC
  Administered 2022-12-18: 2 g via INTRAVENOUS
  Filled 2022-12-18: qty 50

## 2022-12-18 MED ORDER — VITAMIN B-12 1000 MCG PO TABS
1000.0000 ug | ORAL_TABLET | Freq: Every day | ORAL | Status: DC
  Administered 2022-12-19: 1000 ug via ORAL
  Filled 2022-12-18: qty 1

## 2022-12-18 MED ORDER — POTASSIUM CHLORIDE 10 MEQ/100ML IV SOLN
10.0000 meq | INTRAVENOUS | Status: DC
  Administered 2022-12-18: 10 meq via INTRAVENOUS
  Filled 2022-12-18 (×2): qty 100

## 2022-12-18 MED ORDER — FAMOTIDINE 20 MG PO TABS
20.0000 mg | ORAL_TABLET | Freq: Two times a day (BID) | ORAL | Status: DC
  Administered 2022-12-18 – 2022-12-19 (×2): 20 mg via ORAL
  Filled 2022-12-18 (×2): qty 1

## 2022-12-18 MED ORDER — ENSURE ENLIVE PO LIQD
237.0000 mL | Freq: Two times a day (BID) | ORAL | Status: DC
  Administered 2022-12-18 – 2022-12-19 (×3): 237 mL via ORAL

## 2022-12-18 MED ORDER — DULOXETINE HCL 30 MG PO CPEP
60.0000 mg | ORAL_CAPSULE | Freq: Every day | ORAL | Status: DC
  Administered 2022-12-18 – 2022-12-19 (×2): 60 mg via ORAL
  Filled 2022-12-18 (×2): qty 2

## 2022-12-18 MED ORDER — ADULT MULTIVITAMIN W/MINERALS CH
1.0000 | ORAL_TABLET | Freq: Every day | ORAL | Status: DC
  Administered 2022-12-19: 1 via ORAL
  Filled 2022-12-18: qty 1

## 2022-12-18 MED ORDER — K PHOS MONO-SOD PHOS DI & MONO 155-852-130 MG PO TABS
500.0000 mg | ORAL_TABLET | Freq: Three times a day (TID) | ORAL | Status: AC
  Administered 2022-12-18 (×3): 500 mg via ORAL
  Filled 2022-12-18 (×3): qty 2

## 2022-12-18 MED ORDER — AMLODIPINE BESYLATE 5 MG PO TABS
5.0000 mg | ORAL_TABLET | Freq: Every day | ORAL | Status: DC
  Administered 2022-12-18 – 2022-12-19 (×2): 5 mg via ORAL
  Filled 2022-12-18 (×2): qty 1

## 2022-12-18 MED ORDER — POLYETHYLENE GLYCOL 3350 17 G PO PACK
17.0000 g | PACK | Freq: Every day | ORAL | Status: DC
  Administered 2022-12-19: 17 g via ORAL
  Filled 2022-12-18: qty 1

## 2022-12-18 MED ORDER — DOCUSATE SODIUM 50 MG/5ML PO LIQD
100.0000 mg | Freq: Two times a day (BID) | ORAL | Status: DC
  Administered 2022-12-18 – 2022-12-19 (×2): 100 mg via ORAL
  Filled 2022-12-18 (×2): qty 10

## 2022-12-18 MED ORDER — GADOBUTROL 1 MMOL/ML IV SOLN
6.0000 mL | Freq: Once | INTRAVENOUS | Status: AC | PRN
  Administered 2022-12-18: 6 mL via INTRAVENOUS

## 2022-12-18 MED ORDER — LEVETIRACETAM 500 MG PO TABS
1000.0000 mg | ORAL_TABLET | Freq: Two times a day (BID) | ORAL | Status: DC
  Administered 2022-12-18 – 2022-12-19 (×2): 1000 mg via ORAL
  Filled 2022-12-18 (×2): qty 2

## 2022-12-18 MED ORDER — IPRATROPIUM-ALBUTEROL 0.5-2.5 (3) MG/3ML IN SOLN
3.0000 mL | Freq: Two times a day (BID) | RESPIRATORY_TRACT | Status: DC
  Administered 2022-12-18 – 2022-12-19 (×2): 3 mL via RESPIRATORY_TRACT
  Filled 2022-12-18 (×2): qty 3

## 2022-12-18 MED ORDER — ASPIRIN 81 MG PO TBEC
81.0000 mg | DELAYED_RELEASE_TABLET | Freq: Every day | ORAL | Status: DC
  Administered 2022-12-18 – 2022-12-19 (×2): 81 mg via ORAL
  Filled 2022-12-18 (×2): qty 1

## 2022-12-18 NOTE — Progress Notes (Signed)
Nutrition Follow Up Note   DOCUMENTATION CODES:   Not applicable  INTERVENTION:   Ensure Enlive po BID, each supplement provides 350 kcal and 20 grams of protein.  MVI po daily   Pt at high refeed risk; recommend monitor potassium, magnesium and phosphorus labs daily until stable  Daily weights   B12 level pending   NUTRITION DIAGNOSIS:   Inadequate oral intake related to inability to eat (pt sedated and ventilated) as evidenced by NPO status. -resolved   GOAL:   Patient will meet greater than or equal to 90% of their needs -progressing   MONITOR:   PO intake, Supplement acceptance, Labs, Weight trends, I & O's, Skin  ASSESSMENT:   62 y/o female with h/o anxiety, depression, kidney stones, chronic pain, substance abuse, COPD, MI, seizures, HTN, HLD, GERD, Wolff-Parkinson-White (WPW) syndrome, ovarian tumors s/p resection, diverticulitis s/p partial colectomy with colostomy and later takedown (1990's?) complicated by rectovaginal fistula (s/p partial vaginectomy, rectus abdominus muscle rotational flap 2000) and ventral hernia (s/p repair with mesh 2002 complicated by gangrene and abdominal infection & EC fistula requiring mesh removal in 2010), recurrent ventral hernia 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  Pt extubated yesterday. Pt passed a BSE and was able to be placed on a regular diet. RD will add supplements and MVI to help pt meet her estimated needs. Pt is at refeed risk. B12 level pending. Per chart, pt is weight stable since admission.      Medications reviewed and include: colace, lovenox, pepcid, miralax, unasyn, LRS @75ml /hr, Ms sulfate  Labs reviewed: K 3.1(L), BUN 7(L), creat 0.37(L), P 2.3(L), Mg 1.7 wnl Hgb 9.2(L), Hct 27.5(L) Cbgs- 87, 134 x 24 hrs   Diet Order:   Diet Order             Diet Heart Room service appropriate? Yes; Fluid consistency: Thin  Diet effective now                  EDUCATION NEEDS:   No education needs have been identified at this time  Skin:  Skin Assessment: Reviewed RN Assessment (ecchymosis)  Last BM:  6/5  Height:   Ht Readings from Last 1 Encounters:  12/17/22 5\' 2"  (1.575 m)    Weight:   Wt Readings from Last 1 Encounters:  12/18/22 65.1 kg    Ideal Body Weight:  50 kg  BMI:  Body mass index is 26.25 kg/m.  Estimated Nutritional Needs:   Kcal:  1600-1800kcal/day  Protein:  80-90g/day  Fluid:  1.6-1.8L/day  Betsey Holiday MS, RD, LDN Please refer to [facility] for RD and/or RD on-call/weekend/after hours pager

## 2022-12-18 NOTE — Progress Notes (Signed)
Neurology progress note  S: Patient extubated and doing well. States she had seizures years ago, none recently, can't describe what they were like, has never been on an AED. Does not think prior seizures were in the setting of substance abuse.  O:  Vitals:   12/18/22 0900 12/18/22 1600  BP: 135/73 (!) 163/70  Pulse: 85 96  Resp: 11 17  Temp:  98.4 F (36.9 C)  SpO2: 98% 99%   Exam  Vitals:   12/18/22 0900 12/18/22 1600  BP: 135/73 (!) 163/70  Pulse: 85 96  Resp: 11 17  Temp:  98.4 F (36.9 C)  SpO2: 98% 99%    Gen: patient lying in bed, NAD CV: extremities appear well-perfused Resp: normal WOB  Neurologic exam MS: alert, oriented x4, follows commands Speech: no dysarthria, no aphasia CN: PERRL, VFF, EOMI, sensation intact, face symmetric, hearing intact to voice Motor: 5/5 strength throughout Sensory: SILT Reflexes: 2+ symm with toes down bilat Coordination: FNF intact bilat Gait: deferred  CT Head without contrast No acute process on personal review   rEEG: mild DS on sedation, no epileptiform abnl  A/P: This is a 61 yo woman with pmhx WPW s/p ablation, HTN, colovaginal fistula with intraabdominal abscesses, seizures not on AEDs who presented to the ED with multiple seizures ultimately requiring intubation for airway protection. Cocaine (+) on admission. States she had seizures years ago, none recently, can't describe what they were like, has never been on an AED. Does not think prior seizures were in the setting of substance abuse. She is now extubated and doing well, no further events on keppra.  - MRI brain wwo  Will continue to follow  Bing Neighbors, MD [facility] [phone]  If 7pm- 7am, please page neurology on call as listed in AMION.

## 2022-12-18 NOTE — Assessment & Plan Note (Signed)
Start norvasc

## 2022-12-18 NOTE — Assessment & Plan Note (Signed)
Patient much improved.  Currently on Keppra.  MRI of the brain with and without contrast ordered.  Neurology to follow-up today.  Advised no driving.

## 2022-12-18 NOTE — Assessment & Plan Note (Addendum)
Hemoglobin 10.2.  Vitamin B12 level slightly low.  Oral B12 prescribed.  Ferritin above 100 going along with anemia of chronic disease.

## 2022-12-18 NOTE — Progress Notes (Signed)
Foley catheter removed per MD order. 

## 2022-12-18 NOTE — Discharge Instructions (Signed)
                  Intensive Outpatient Programs  [facility]    [facility] [address]     [address] [place],  [place]     [place], [place] [phone]      [phone]  [facility]   [facility]  (Inpatient and outpatient)  [phone] (Suboxone and Methadone) [address]           [phone]           [facility]    [facility] - Intensive Outpatient [address]     [address] [place], [place] [address]     [place], [place]  [phone]      [phone]  [facility] (Outpatient, Inpatient, [facility]  [facility] (Groups and [facility]idental) (insurance only) [phone]    [place], [place]          [phone]       [facility]    [facility] (for caregivers and family) [address]     [address] [place], [place]     [place], [place] [phone]      [phone]  Residential Treatment Programs  [facility]  [facility](2 years) Residential: 90 days)  [facility] [address]      [address] [place], [place]     [place], [place] [phone]      [phone] or [phone]  [facility]    [facility] [address]      [address] [place], [place]     [place], [place] [phone]      [phone]  [facility]   [facility] ([facility]) [address]     [address] [place], [place] [address]     [place], [place] [phone]      [phone] Admissions: 8am-3pm M-F  [facility]: Residential Program (90 Days)              [facility]: [facility]  [place], [place]     [place],   [phone] or [phone]    (Walk in Hours over the weekend or by referral)   [facility]: [facility]:[phone] (for crisis  response 24 hours a day) 

## 2022-12-18 NOTE — Assessment & Plan Note (Addendum)
Present on admission with lactic acid greater than 2, aspiration pneumonia right lung, tachycardia and leukocytosis.  On empiric Unasyn.

## 2022-12-18 NOTE — Plan of Care (Signed)

## 2022-12-18 NOTE — Assessment & Plan Note (Signed)
Benzodiazepine, cocaine, cannabis and tricyclics on urine toxicology.

## 2022-12-18 NOTE — Progress Notes (Signed)
Transition of Care [facility]) - Inpatient Brief Assessment   Patient Details  Name: GERALDEAN WALEN MRN: [id number] Date of Birth: [birth year]  [facility]) CM/SW Contact:    Darolyn Rua, LCSW Phone Number: 12/18/2022, 10:56 AM   Clinical Narrative:  Patient from home with seizure like activity, hx of cocaine and marijuana use. Patient intubated and extubated 6/6. Patient has PCP Lorenda Cahill FNP, insurance [place] Medicaid [facility]  SA resources on AVS, patient currently being transferred from ICU to 1C. TOC will continue to follow for needs.   Transition of Care Asessment: Insurance and Status: Insurance coverage has been reviewed Patient has primary care physician: Yes Home environment has been reviewed: from home with family Prior level of function:: independent Prior/Current Home Services: No current home services Social Determinants of Health Reivew: SDOH reviewed interventions complete ([facility] resources on AVS) Readmission risk has been reviewed: Yes Transition of care needs: no transition of care needs at this time

## 2022-12-18 NOTE — Assessment & Plan Note (Addendum)
Replaced yesterday. ?

## 2022-12-18 NOTE — Progress Notes (Signed)
Progress Note   Patient: Alison Walker OVF:[id number] DOB: May 27, 1961 DOA: 12/17/2022     1 DOS: the patient was seen and examined on 12/18/2022   Brief hospital course: 62 yo female with a past medical history significant for WPW s/p ablation, colovaginal fistula with intraabdominal abscess, and seizures but not on any antiepileptics, who presents to [facility] ED on 12/17/22 from home via EMS on 12/17/22 for evaluation of seizures. History provided per chart review as patient is intubated and sedated at this time.   EMS was called by the patient's daughter due to suspected seizure activity.  She reported 30 minutes of generalized tonic-clonic activity, which self resolved.  Upon EMS arrival she was postictal, but then developed what EMS reported as lower tonic clonic seizure activity of which she was given 2.5 mg of versed with cessation of the seizure activity.   Upon arrival to the ED she was somnolent and encephalopathic, but was able to arouse and follow some simple commands.  She did admit to cocaine use that could have been mixed in with her cannabis.  Patient was intubated for airway protection.  Started on Keppra for status epilepticus.  Started on Unasyn for sepsis and aspiration pneumonia.  Assessment and Plan: * Status epilepticus [facility]) Patient much improved.  Currently on Keppra.  MRI of the brain with and without contrast ordered.  Neurology to follow-up today.  Advised no driving.  Severe sepsis (HCC) Present on admission with lactic acid greater than 2, aspiration pneumonia right lung, tachycardia and leukocytosis.  On empiric Unasyn.  Polysubstance abuse (HCC) Benzodiazepine, cocaine, cannabis and tricyclics on urine toxicology.  Hypokalemia Started replacing with IV today but it was burning too much so I switched over to oral.  Hypomagnesemia Replaced IV  Hypophosphatemia Oral replacement.  Essential hypertension Start norvasc  Anemia, unspecified Hemoglobin 9.2.  Check  iron studies tomorrow morning to further classify anemia.        Subjective: Patient has had a history of seizure before but was not on any seizure medications prior to coming into the hospital.  Patient states that she thinks it was stressed that because the seizures.  Patient feels fine now and offers no complaints.  Physical Exam: Vitals:   12/18/22 0730 12/18/22 0800 12/18/22 0830 12/18/22 0900  BP: 127/76 134/81 (!) 143/95 135/73  Pulse: 80 87 84 85  Resp: 11 12 16 11   Temp:  98.4 F (36.9 C)    TempSrc:  Oral    SpO2: 97% 100% 98% 98%  Weight:      Height:       Physical Exam HENT:     Head: Normocephalic.     Mouth/Throat:     Pharynx: No oropharyngeal exudate.  Eyes:     General: Lids are normal.     Conjunctiva/sclera: Conjunctivae normal.  Cardiovascular:     Rate and Rhythm: Normal rate and regular rhythm.     Heart sounds: Normal heart sounds, S1 normal and S2 normal.  Pulmonary:     Breath sounds: No decreased breath sounds, wheezing, rhonchi or rales.  Abdominal:     Palpations: Abdomen is soft.     Tenderness: There is no abdominal tenderness.  Musculoskeletal:     Right lower leg: No swelling.     Left lower leg: No swelling.  Skin:    General: Skin is warm.     Findings: No rash.  Neurological:     Mental Status: She is alert and oriented to person,  place, and time.     Data Reviewed: Potassium 3.1, creatinine 0.37, phosphorus 2.3, magnesium 1.7, hemoglobin 9.2  Family Communication: Patient does not want her daughter to get any information.  Disposition: Status is: Inpatient Remains inpatient appropriate because: MRI of the brain with and without contrast ordered  Planned Discharge Destination: Home    Time spent: 28 minutes  Author: Alford Highland, MD 12/18/2022 1:04 PM  For on call review [email or web address].

## 2022-12-18 NOTE — Progress Notes (Signed)
Pt to transfer to 1C-127. Report called to Alvino Chapel, RN. VSS, pt stable at this time, belongings sent with pt. Pt transported via hospital bed.

## 2022-12-18 NOTE — Assessment & Plan Note (Addendum)
Replaced IV

## 2022-12-18 NOTE — Plan of Care (Signed)

## 2022-12-18 NOTE — Assessment & Plan Note (Signed)
Started replacing with IV today but it was burning too much so I switched over to oral.

## 2022-12-18 NOTE — Hospital Course (Signed)
62 yo female with a past medical history significant for WPW s/p ablation, colovaginal fistula with intraabdominal abscess, and seizures but not on any antiepileptics, who presents to [facility] ED on 12/17/22 from home via EMS on 12/17/22 for evaluation of seizures. History provided per chart review as patient is intubated and sedated at this time.   EMS was called by the patient's daughter due to suspected seizure activity.  She reported 30 minutes of generalized tonic-clonic activity, which self resolved.  Upon EMS arrival she was postictal, but then developed what EMS reported as lower tonic clonic seizure activity of which she was given 2.5 mg of versed with cessation of the seizure activity.   Upon arrival to the ED she was somnolent and encephalopathic, but was able to arouse and follow some simple commands.  She did admit to cocaine use that could have been mixed in with her cannabis.  Patient was intubated for airway protection.  Started on Keppra for status epilepticus.  Started on Unasyn for sepsis and aspiration pneumonia.  EEG showing severe diffuse encephalopathy nonspecific.  No seizures or lentiform disorders were seen throughout the recording.  6/8.  Patient switched over to oral Keppra.  MRI of the brain negative for acute event. 6/9.  Patient cleared by neurology to go home on Keppra.  No driving.  Unasyn switched over to Augmentin for completion of course for aspiration pneumonia.  Patient advised she must get rid of her substances that she is using.  Replace oral potassium and magnesium for a few days upon discharge.

## 2022-12-19 DIAGNOSIS — E538 Deficiency of other specified B group vitamins: Secondary | ICD-10-CM | POA: Insufficient documentation

## 2022-12-19 DIAGNOSIS — D638 Anemia in other chronic diseases classified elsewhere: Secondary | ICD-10-CM

## 2022-12-19 DIAGNOSIS — G40901 Epilepsy, unspecified, not intractable, with status epilepticus: Secondary | ICD-10-CM | POA: Diagnosis not present

## 2022-12-19 DIAGNOSIS — F1910 Other psychoactive substance abuse, uncomplicated: Secondary | ICD-10-CM | POA: Diagnosis not present

## 2022-12-19 DIAGNOSIS — A419 Sepsis, unspecified organism: Secondary | ICD-10-CM | POA: Diagnosis not present

## 2022-12-19 DIAGNOSIS — E876 Hypokalemia: Secondary | ICD-10-CM | POA: Diagnosis not present

## 2022-12-19 LAB — CBC
HCT: 29.6 % — ABNORMAL LOW (ref 36.0–46.0)
Hemoglobin: 10.2 g/dL — ABNORMAL LOW (ref 12.0–15.0)
MCH: 30.2 pg (ref 26.0–34.0)
MCHC: 34.5 g/dL (ref 30.0–36.0)
MCV: 87.6 fL (ref 80.0–100.0)
Platelets: 355 10*3/uL (ref 150–400)
RBC: 3.38 MIL/uL — ABNORMAL LOW (ref 3.87–5.11)
RDW: 14.7 % (ref 11.5–15.5)
WBC: 6.3 10*3/uL (ref 4.0–10.5)
nRBC: 0 % (ref 0.0–0.2)

## 2022-12-19 LAB — RENAL FUNCTION PANEL
Albumin: 2.4 g/dL — ABNORMAL LOW (ref 3.5–5.0)
Anion gap: 9 (ref 5–15)
BUN: 7 mg/dL — ABNORMAL LOW (ref 8–23)
CO2: 25 mmol/L (ref 22–32)
Calcium: 8 mg/dL — ABNORMAL LOW (ref 8.9–10.3)
Chloride: 103 mmol/L (ref 98–111)
Creatinine, Ser: 0.38 mg/dL — ABNORMAL LOW (ref 0.44–1.00)
GFR, Estimated: >60 mL/min (ref >60)
Glucose, Bld: 97 mg/dL (ref 70–99)
Phosphorus: 4.7 mg/dL — ABNORMAL HIGH (ref 2.5–4.6)
Potassium: 3.3 mmol/L — ABNORMAL LOW (ref 3.5–5.1)
Sodium: 137 mmol/L (ref 135–145)

## 2022-12-19 LAB — PROCALCITONIN: Procalcitonin: 0.13 ng/mL

## 2022-12-19 LAB — MAGNESIUM: Magnesium: 1.7 mg/dL (ref 1.7–2.4)

## 2022-12-19 LAB — FERRITIN: Ferritin: 124 ng/mL (ref 11–307)

## 2022-12-19 LAB — CULTURE, BLOOD (ROUTINE X 2): Special Requests: ADEQUATE

## 2022-12-19 MED ORDER — ENSURE ENLIVE PO LIQD: 237.0000 mL | Freq: Two times a day (BID) | ORAL | 0 refills | Status: AC

## 2022-12-19 MED ORDER — FAMOTIDINE 20 MG PO TABS: 20.0000 mg | ORAL_TABLET | Freq: Every day | ORAL | 0 refills | Status: AC

## 2022-12-19 MED ORDER — POLYETHYLENE GLYCOL 3350 17 G PO PACK: 17.0000 g | PACK | Freq: Every day | ORAL | 0 refills | Status: DC | PRN

## 2022-12-19 MED ORDER — LEVETIRACETAM 1000 MG PO TABS: 1000.0000 mg | ORAL_TABLET | Freq: Two times a day (BID) | ORAL | 0 refills | Status: AC

## 2022-12-19 MED ORDER — AMOXICILLIN-POT CLAVULANATE 875-125 MG PO TABS: 1.0000 | ORAL_TABLET | Freq: Two times a day (BID) | ORAL | 0 refills | Status: AC

## 2022-12-19 MED ORDER — MAGNESIUM SULFATE 2 GM/50ML IV SOLN
2.0000 g | Freq: Once | INTRAVENOUS | Status: AC
  Administered 2022-12-19: 2 g via INTRAVENOUS
  Filled 2022-12-19: qty 50

## 2022-12-19 MED ORDER — CYANOCOBALAMIN 1000 MCG PO TABS: 1000.0000 ug | ORAL_TABLET | Freq: Every day | ORAL | 0 refills | Status: AC

## 2022-12-19 MED ORDER — MAGNESIUM OXIDE 400 MG PO TABS: 400.0000 mg | ORAL_TABLET | Freq: Every day | ORAL | 0 refills | Status: AC

## 2022-12-19 MED ORDER — POTASSIUM CHLORIDE CRYS ER 20 MEQ PO TBCR: 20.0000 meq | EXTENDED_RELEASE_TABLET | Freq: Every day | ORAL | 0 refills | Status: AC

## 2022-12-19 MED ORDER — POTASSIUM CHLORIDE CRYS ER 20 MEQ PO TBCR
40.0000 meq | EXTENDED_RELEASE_TABLET | Freq: Once | ORAL | Status: AC
  Administered 2022-12-19: 40 meq via ORAL
  Filled 2022-12-19: qty 2

## 2022-12-19 NOTE — TOC Transition Note (Signed)
Transition of Care [facility]) - CM/SW Discharge Note   Patient Details  Name: Alison Walker MRN: [id number] Date of Birth: 04-Nov-1960  Transition of Care [facility]) CM/SW Contact:  Bing Quarry, RN Phone Number: 12/19/2022, 10:04 AM   Clinical Narrative: 12/19/22: Patient has discharge orders to home/self care with follow up instructions for PCP/FNP and outpatient referral to:  Ambulatory referral to Neurology Complete by: As directed  Morene Crocker, MD  [phone]   [facility] [phone] [facility] [address] [facility] [place] [address]      Gabriel Cirri RN CM [phone] Only [phone])   Final next level of care: Home/Self Care     Patient Goals and CMS Choice      Discharge Placement                         Discharge Plan and Services Additional resources added to the After Visit Summary for                                       Social Determinants of Health (SDOH) Interventions SDOH Screenings   Tobacco Use: High Risk (09/08/2022)     Readmission Risk Interventions     No data to display

## 2022-12-19 NOTE — Progress Notes (Signed)
MD order received in [facility] to discharge pt home today; TOC signed off of the pt with no discharge needs; verbally reviewed AVS with pt, Rxs escribed to the [facility] on [address] in [place]; no questions voiced by the pt; pt discharged via wheelchair by nursing to the [facility] entrance where her ride will be picking her up at 1200; pt appropriate for discharge to wait at the [facility] entrance for her ride; verbally let the [employer] employee know that the pt's ride should be picking her up at 1200

## 2022-12-19 NOTE — Assessment & Plan Note (Signed)
Oral B12 replacement

## 2022-12-19 NOTE — Plan of Care (Signed)

## 2022-12-19 NOTE — Discharge Summary (Signed)
Physician Discharge Summary   Patient: Alison Walker MRN: [id number] DOB: 01/10/1961  Admit date:     12/17/2022  Discharge date: 12/19/22  Discharge Physician: Alford Highland   PCP: Lorn Junes, FNP   Recommendations at discharge:   Follow-up your PCP 5 days Outpatient neurology referral No driving  Discharge Diagnoses: Principal Problem:   Status epilepticus (HCC) Active Problems:   Severe sepsis (HCC)   Polysubstance abuse (HCC)   Hypokalemia   Hypomagnesemia   Hypophosphatemia   Essential hypertension   Anemia of chronic disease   Vitamin B12 deficiency    Hospital Course: 62 yo female with a past medical history significant for WPW s/p ablation, colovaginal fistula with intraabdominal abscess, and seizures but not on any antiepileptics, who presents to [facility] ED on 12/17/22 from home via EMS on 12/17/22 for evaluation of seizures. History provided per chart review as patient is intubated and sedated at this time.   EMS was called by the patient's daughter due to suspected seizure activity.  She reported 30 minutes of generalized tonic-clonic activity, which self resolved.  Upon EMS arrival she was postictal, but then developed what EMS reported as lower tonic clonic seizure activity of which she was given 2.5 mg of versed with cessation of the seizure activity.   Upon arrival to the ED she was somnolent and encephalopathic, but was able to arouse and follow some simple commands.  She did admit to cocaine use that could have been mixed in with her cannabis.  Patient was intubated for airway protection.  Started on Keppra for status epilepticus.  Started on Unasyn for sepsis and aspiration pneumonia.  EEG showing severe diffuse encephalopathy nonspecific.  No seizures or lentiform disorders were seen throughout the recording.  6/8.  Patient switched over to oral Keppra.  MRI of the brain negative for acute event. 6/9.  Patient cleared by neurology to go home on Keppra.   No driving.  Unasyn switched over to Augmentin for completion of course for aspiration pneumonia.  Patient advised she must get rid of her substances that she is using.  Replace oral potassium and magnesium for a few days upon discharge.  Assessment and Plan: * Status epilepticus [facility] [place]) Patient much improved.  Currently on Keppra.  MRI of the brain with and without contrast negative for acute event.  Neurology cleared to go home with outpatient neurological follow-up.  Neurology recommended Keppra..  Advised no driving.  Severe sepsis (HCC) Present on admission with lactic acid greater than 2, aspiration pneumonia right lung, tachycardia and leukocytosis.  On empiric Unasyn.  Will switch to Augmentin to complete course.  Polysubstance abuse (HCC) Benzodiazepine, cocaine, cannabis and tricyclics on urine toxicology.  Patient advised that all of these substances can reduce her threshold to have a seizure.  Hypokalemia Replace orally today and for few more days upon discharge.  Hypomagnesemia Replaced IV again today and orally on a few more days upon discharge.  Hypophosphatemia Replaced yesterday  Essential hypertension Continue norvasc  Vitamin B12 deficiency Oral B12 replacement  Anemia of chronic disease Hemoglobin 10.2.  Vitamin B12 level slightly low.  Oral B12 prescribed.  Ferritin above 100 going along with anemia of chronic disease.         Consultants: Neurology Procedures performed: EEG Disposition: Home Diet recommendation:  Cardiac diet DISCHARGE MEDICATION: Allergies as of 12/19/2022       Reactions   Ciprofloxacin Rash   Morphine Rash        Medication List  STOP taking these medications    cyclobenzaprine 5 MG tablet Commonly known as: FLEXERIL   hydrOXYzine 25 MG tablet Commonly known as: ATARAX   nicotine 14 mg/24hr patch Commonly known as: NICODERM CQ - dosed in mg/24 hours   pantoprazole 40 MG tablet Commonly known as: Protonix    zolpidem 5 MG tablet Commonly known as: AMBIEN       TAKE these medications    acetaminophen 500 MG tablet Commonly known as: TYLENOL Take 500 mg by mouth every 6 (six) hours as needed.   albuterol 108 (90 Base) MCG/ACT inhaler Commonly known as: VENTOLIN HFA Inhale 1-2 puffs into the lungs every 4 (four) hours as needed.   amLODipine 5 MG tablet Commonly known as: NORVASC Take 5 mg by mouth daily.   amoxicillin-clavulanate 875-125 MG tablet Commonly known as: AUGMENTIN Take 1 tablet by mouth 2 (two) times daily for 3 days.   aspirin EC 81 MG tablet Take 81 mg by mouth daily.   cyanocobalamin 1000 MCG tablet Take 1 tablet (1,000 mcg total) by mouth daily.   diclofenac Sodium 1 % Gel Commonly known as: VOLTAREN Apply 4 g topically 4 (four) times daily.   DULoxetine HCl 60 MG Csdr Take 60 mg by mouth daily.   famotidine 20 MG tablet Commonly known as: PEPCID Take 1 tablet (20 mg total) by mouth daily.   feeding supplement Liqd Take 237 mLs by mouth 2 (two) times daily between meals.   gabapentin 800 MG tablet Commonly known as: NEURONTIN Take 800 mg by mouth 3 (three) times daily.   levETIRAcetam 1000 MG tablet Commonly known as: KEPPRA Take 1 tablet (1,000 mg total) by mouth 2 (two) times daily. What changed:  medication strength how much to take   lidocaine 5 % Commonly known as: LIDODERM Place 1 patch onto the skin daily.   magnesium oxide 400 MG tablet Commonly known as: MAG-OX Take 1 tablet (400 mg total) by mouth daily for 3 days. Start taking on: December 20, 2022   Narcan 4 MG/0.1ML Liqd nasal spray kit Generic drug: naloxone Place 1 spray into the nose once.   polyethylene glycol 17 g packet Commonly known as: MIRALAX / GLYCOLAX Place 17 g into feeding tube daily as needed for moderate constipation.   potassium chloride SA 20 MEQ tablet Commonly known as: KLOR-CON M Take 1 tablet (20 mEq total) by mouth daily for 3 days.   tiotropium 18  MCG inhalation capsule Commonly known as: SPIRIVA Place 18 mcg into inhaler and inhale daily.        Follow-up Information     Lorn Junes, FNP Follow up in 5 day(s).   Specialty: Family Medicine Contact information: [address] [place] [address] [phone]                Discharge Exam: Ceasar Mons Weights   12/17/22 1234 12/18/22 0500 12/19/22 0221  Weight: 65.7 kg 65.1 kg 61.2 kg   Physical Exam HENT:     Head: Normocephalic.     Mouth/Throat:     Pharynx: No oropharyngeal exudate.  Eyes:     General: Lids are normal.     Conjunctiva/sclera: Conjunctivae normal.  Cardiovascular:     Rate and Rhythm: Normal rate and regular rhythm.     Heart sounds: Normal heart sounds, S1 normal and S2 normal.  Pulmonary:     Breath sounds: No decreased breath sounds, wheezing, rhonchi or rales.  Abdominal:     Palpations: Abdomen  is soft.     Tenderness: There is no abdominal tenderness.  Musculoskeletal:     Right lower leg: No swelling.     Left lower leg: No swelling.  Skin:    General: Skin is warm.     Findings: No rash.  Neurological:     Mental Status: She is alert and oriented to person, place, and time.      Condition at discharge: stable  The results of significant diagnostics from this hospitalization (including imaging, microbiology, ancillary and laboratory) are listed below for reference.   Imaging Studies: MR BRAIN W WO CONTRAST  Result Date: 12/19/2022 CLINICAL DATA:  Initial evaluation for new onset seizure. EXAM: MRI HEAD WITHOUT AND WITH CONTRAST TECHNIQUE: Multiplanar, multiecho pulse sequences of the brain and surrounding structures were obtained without and with intravenous contrast. CONTRAST:  6mL GADAVIST GADOBUTROL 1 MMOL/ML IV SOLN COMPARISON:  Prior CT from 12/17/2022. FINDINGS: Brain: Mild age-related cerebral atrophy. Minimal hazy T2/FLAIR signal abnormality involving the periventricular white matter, most like related chronic  microvascular ischemic disease, minor for age. No evidence for acute or subacute ischemia. Gray-white matter differentiation maintained. No areas of chronic cortical infarction. No acute or chronic intracranial blood products. No mass lesion, midline shift or mass effect. No hydrocephalus or extra-axial fluid collection. Pituitary gland suprasellar region within normal limits. No intrinsic temporal lobe abnormality. No abnormal enhancement. Vascular: Major intracranial vascular flow voids are maintained. Skull and upper cervical spine: Cranial junction with normal limits. Bone marrow signal intensity normal. No scalp soft tissue abnormality. Sinuses/Orbits: Globes and orbital soft tissues within normal limits. Paranasal sinuses are clear. No significant mastoid effusion. Other: None. IMPRESSION: 1. No acute intracranial abnormality. 2. Mild age-related cerebral atrophy with chronic small vessel ischemic disease. Electronically Signed   By: Rise Mu M.D.   On: 12/19/2022 01:11   DG Chest Port 1 View  Result Date: 12/18/2022 CLINICAL DATA:  Acute respiratory failure with hypoxia. EXAM: PORTABLE CHEST 1 VIEW COMPARISON:  12/17/2022 FINDINGS: Interval extubation and removal of enteric tube. Heart size appears normal. Airspace and interstitial opacities within the right lung noted which appear unchanged from the previous exam. Unchanged interstitial prominence in the left lung with mild patchy opacities in the left base, similar to previous exam. IMPRESSION: 1. No change in aeration to the lungs compared with the previous exam. 2. Interval extubation. Electronically Signed   By: Signa Kell M.D.   On: 12/18/2022 06:53   EEG adult  Result Date: 12/17/2022 Jefferson Fuel, MD     12/17/2022  1:04 PM Routine EEG Report Niang R Malas is a 62 y.o. female with a history of seizure who is undergoing an EEG to evaluate for seizures. Report: This EEG was acquired with electrodes placed according to the  International 10-20 electrode system (including Fp1, Fp2, F3, F4, C3, C4, P3, P4, O1, O2, T3, T4, T5, T6, A1, A2, Fz, Cz, Pz). The following electrodes were missing or displaced: none. The best background was 6-7 Hz with overriding beta frequencies and brief intervening periods of suppression lasting 1-2 seconds. No clear waking rhythm or sleep architecture was identified. There was no focal slowing. There were no interictal epileptiform discharges. There were no electrographic seizures identified. Impression and clinical correlation: This EEG was obtained while sedated on propofol and fentanyl and is abnormal due to: - mild diffuse slowing indicative of global cerebral dysfunction, medication effect, or both - brief intervening periods of suppression likely due to medication effect Bing Neighbors, MD  [facility] [phone] If 7pm- 7am, please page neurology on call as listed in AMION.   Rapid EEG  Result Date: 12/17/2022 Charlsie Quest, MD     12/18/2022  2:17 PM Patient Name: SAOIRSE MELLEY MRN: [id number] Epilepsy Attending: Charlsie Quest Referring Physician/Provider: Rust-Chester, Cecelia Byars, NP Duration: 12/17/2022 0913 to 1509 Patient history: 62 y.o. female who presents to the ED for evaluation of Altered Mental Status. Rapid EEG to evaluate for seizure Level of alertness:lethargic AEDs during EEG study: Technical aspects: This EEG was obtained using a 10 lead EEG system positioned circumferentially without any parasagittal coverage (rapid EEG). Computer selected EEG is reviewed as  well as background features and all clinically significant events. Description: EEG showed continuous generalized polymorphic 3 to 7 Hz theta-delta slowing. Hyperventilation and photic stimulation were not performed.   Rapid EEG was disconnected between 12/17/2022 1025 to 1149. ABNORMALITY - Continuous slow, generalized IMPRESSION: This rapid EEG is suggestive of severe diffuse encephalopathy, nonspecific etiology. No  seizures or epileptiform discharges were seen throughout the recording. If suspicion for interictal activity remains a concern, a conventional EEG can be considered. Charlsie Quest   DG Chest Port 1 View  Result Date: 12/17/2022 CLINICAL DATA:  Intubation. EXAM: PORTABLE CHEST 1 VIEW COMPARISON:  12/17/2022 at 6:16 a.m. FINDINGS: Endotracheal tube tip now lies at the carina. Recommend retracting 1-2 cm. Bilateral interstitial and patchy airspace opacities, most evident on the right, are without change from the earlier study. No pneumothorax. Nasal/orogastric tube passes well below the diaphragm, unchanged. IMPRESSION: Endotracheal tube tip now projects at the carina. Recommend retracting 1-2 cm. No change in lung aeration from the earlier exam. Critical Value/emergent results were called by telephone at the time of interpretation on 12/17/2022 at 11:31 am to the nurse Sheria Lang, who verbally acknowledged these results. Electronically Signed   By: Amie Portland M.D.   On: 12/17/2022 11:32   DG Chest Portable 1 View  Result Date: 12/17/2022 CLINICAL DATA:  Status post intubation. EXAM: PORTABLE CHEST 1 VIEW COMPARISON:  12/17/2022 FINDINGS: The endotracheal tube tip is in the right mainstem bronchus. Advise withdrawing by at least 4.5 cm. Enteric tube tip and side port are in the stomach. Stable cardiomediastinal contours. Bilateral scratch set diffuse airspace disease within the right lung is unchanged from previous exam. New patchy opacities are noted within the left upper lobe and left lower lobe. Visualized osseous structures appear grossly intact. IMPRESSION: 1. Endotracheal tube tip is in the right mainstem bronchus. Advise withdrawing by at least 4.5 cm. 2. Worsening aeration to the left lung compared with previous exam. Persistent diffuse airspace disease throughout the right lung. Electronically Signed   By: Signa Kell M.D.   On: 12/17/2022 07:13   CT HEAD WO CONTRAST ( )  Result Date:  12/17/2022 CLINICAL DATA:  Seizure-like activity EXAM: CT HEAD WITHOUT CONTRAST TECHNIQUE: Contiguous axial images were obtained from the base of the skull through the vertex without intravenous contrast. RADIATION DOSE REDUCTION: This exam was performed according to the departmental dose-optimization program which includes automated exposure control, adjustment of the mA and/or kV according to patient size and/or use of iterative reconstruction technique. COMPARISON:  11/29/2021 FINDINGS: Brain: No evidence of acute infarction, hemorrhage, hydrocephalus, extra-axial collection or mass lesion/mass effect. Mild cerebral volume loss. Vascular: No hyperdense vessel or unexpected calcification. Skull: Normal. Negative for fracture or focal lesion. Sinuses/Orbits: No acute finding. IMPRESSION: No acute finding or correlate for seizure history. Electronically Signed   By: Christiane Ha  Watts M.D.   On: 12/17/2022 06:23   DG Chest Portable 1 View  Result Date: 12/17/2022 CLINICAL DATA:  eval aspiration EXAM: PORTABLE CHEST - 1 VIEW COMPARISON:  11/30/2021 FINDINGS: Extensive patchy airspace opacities throughout the right lung, new since previous. Left lung remains clear. Heart size and mediastinal contours are within normal limits. No effusion. Visualized bones unremarkable. Cholecystectomy clips. IMPRESSION: Extensive right lung infiltrates. Electronically Signed   By: Corlis Leak M.D.   On: 12/17/2022 06:09    Microbiology: Results for orders placed or performed during the hospital encounter of 12/17/22  Blood culture (routine x 2)     Status: None (Preliminary result)   Collection Time: 12/17/22  7:50 AM   Specimen: BLOOD  Result Value Ref Range Status   Specimen Description BLOOD RIGHT FA  Final   Special Requests   Final    BOTTLES DRAWN AEROBIC AND ANAEROBIC Blood Culture adequate volume   Culture   Final    NO GROWTH 2 DAYS Performed at [facility], [address] [place], [place] [address]     Report Status PENDING  Incomplete  SARS Coronavirus 2 by RT PCR (hospital order, performed in [facility] hospital lab) *cepheid single result test* Anterior Nasal Swab     Status: None   Collection Time: 12/17/22  7:50 AM   Specimen: Anterior Nasal Swab  Result Value Ref Range Status   SARS Coronavirus 2 by RT PCR NEGATIVE NEGATIVE Final    Comment: (NOTE) SARS-CoV-2 target nucleic acids are NOT DETECTED.  The SARS-CoV-2 RNA is generally detectable in upper and lower respiratory specimens during the acute phase of infection. The lowest concentration of SARS-CoV-2 viral copies this assay can detect is 250 copies / mL. A negative result does not preclude SARS-CoV-2 infection and should not be used as the sole basis for treatment or other patient management decisions.  A negative result may occur with improper specimen collection / handling, submission of specimen other than nasopharyngeal swab, presence of viral mutation(s) within the areas targeted by this assay, and inadequate number of viral copies (<250 copies / mL). A negative result must be combined with clinical observations, patient history, and epidemiological information.  Fact Sheet for Patients:   RoadLapTop.co.za  Fact Sheet for Healthcare Providers: http://kim-miller.com/  This test is not yet approved or  cleared by the [place] FDA and has been authorized for detection and/or diagnosis of SARS-CoV-2 by FDA under an Emergency Use Authorization (EUA).  This EUA will remain in effect (meaning this test can be used) for the duration of the COVID-19 declaration under Section 564(b)(1) of the Act, 21 U.S.C. section 360bbb-3(b)(1), unless the authorization is terminated or revoked sooner.  Performed at [facility], [address] [place], [place] [address]   MRSA Next Gen by PCR, Nasal     Status: None   Collection Time: 12/17/22  8:55 AM   Specimen: Nasal  Mucosa; Nasal Swab  Result Value Ref Range Status   MRSA by PCR Next Gen NOT DETECTED NOT DETECTED Final    Comment: (NOTE) The GeneXpert MRSA Assay (FDA approved for NASAL specimens only), is one component of a comprehensive MRSA colonization surveillance program. It is not intended to diagnose MRSA infection nor to guide or monitor treatment for MRSA infections. Test performance is not FDA approved in patients less than 21 years old. Performed at [facility], [address] [place], [place] [address]   Blood culture (routine x 2)  Status: None (Preliminary result)   Collection Time: 12/17/22  9:23 AM   Specimen: BLOOD  Result Value Ref Range Status   Specimen Description BLOOD RIGHT [facility]  Final   Special Requests   Final    BOTTLES DRAWN AEROBIC ONLY Blood Culture results may not be optimal due to an inadequate volume of blood received in culture bottles   Culture   Final    NO GROWTH 2 DAYS Performed at [facility], [address] [place], [place] [address]    Report Status PENDING  Incomplete    Labs: CBC: Recent Labs  Lab 12/17/22 0359 12/18/22 0607 12/19/22 0544  WBC 13.2* 6.4 6.3  NEUTROABS 11.6*  --   --   HGB 10.7* 9.2* 10.2*  HCT 31.9* 27.5* 29.6*  MCV 88.4 88.7 87.6  PLT 283 272 355   Basic Metabolic Panel: Recent Labs  Lab 12/17/22 0359 12/17/22 0750 12/17/22 1252 12/18/22 0607 12/19/22 0544  NA 134*  --   --  136 137  K 2.3*  --  3.2* 3.1* 3.3*  CL 99  --   --  104 103  CO2 24  --   --  26 25  GLUCOSE 154*  --   --  93 97  BUN 17  --   --  7* 7*  CREATININE 0.75  --   --  0.37* 0.38*  CALCIUM 8.3*  --   --  7.5* 8.0*  MG 1.7  --   --  1.7 1.7  PHOS  --  2.4*  --  2.3* 4.7*   Liver Function Tests: Recent Labs  Lab 12/17/22 0359 12/18/22 0607 12/19/22 0544  AST 23  --   --   ALT 12  --   --   ALKPHOS 87  --   --   BILITOT 1.0  --   --   PROT 7.2  --   --   ALBUMIN 3.6 2.3* 2.4*   CBG: Recent Labs  Lab  12/17/22 1530 12/17/22 1925 12/17/22 2321 12/18/22 0342 12/18/22 0715  GLUCAP 111* 72 89 134* 87    Discharge time spent: greater than 30 minutes.  Signed: Alford Highland, MD [facility] 12/19/2022

## 2022-12-20 LAB — CULTURE, BLOOD (ROUTINE X 2)

## 2022-12-21 LAB — CULTURE, BLOOD (ROUTINE X 2)

## 2022-12-22 LAB — CULTURE, BLOOD (ROUTINE X 2): Culture: NO GROWTH

## 2023-02-12 DIAGNOSIS — R1900 Intra-abdominal and pelvic swelling, mass and lump, unspecified site: Secondary | ICD-10-CM | POA: Diagnosis not present

## 2023-02-12 DIAGNOSIS — Z0130 Encounter for examination of blood pressure without abnormal findings: Secondary | ICD-10-CM | POA: Diagnosis not present

## 2023-02-12 DIAGNOSIS — R160 Hepatomegaly, not elsewhere classified: Secondary | ICD-10-CM | POA: Diagnosis not present

## 2023-02-12 DIAGNOSIS — F1910 Other psychoactive substance abuse, uncomplicated: Secondary | ICD-10-CM | POA: Diagnosis not present

## 2023-02-12 DIAGNOSIS — Z1389 Encounter for screening for other disorder: Secondary | ICD-10-CM | POA: Diagnosis not present

## 2023-02-12 DIAGNOSIS — Z0131 Encounter for examination of blood pressure with abnormal findings: Secondary | ICD-10-CM | POA: Diagnosis not present

## 2023-03-10 DIAGNOSIS — R1900 Intra-abdominal and pelvic swelling, mass and lump, unspecified site: Secondary | ICD-10-CM | POA: Diagnosis not present

## 2023-03-10 DIAGNOSIS — R160 Hepatomegaly, not elsewhere classified: Secondary | ICD-10-CM | POA: Diagnosis not present

## 2023-03-10 DIAGNOSIS — K838 Other specified diseases of biliary tract: Secondary | ICD-10-CM | POA: Diagnosis not present

## 2023-03-10 DIAGNOSIS — R634 Abnormal weight loss: Secondary | ICD-10-CM | POA: Diagnosis not present

## 2023-03-23 ENCOUNTER — Encounter: Payer: Self-pay | Admitting: Gastroenterology

## 2023-03-23 ENCOUNTER — Other Ambulatory Visit: Payer: Self-pay | Admitting: Gastroenterology

## 2023-03-23 ENCOUNTER — Encounter: Payer: Self-pay | Admitting: General Surgery

## 2023-03-23 DIAGNOSIS — K838 Other specified diseases of biliary tract: Secondary | ICD-10-CM

## 2023-03-23 DIAGNOSIS — R935 Abnormal findings on diagnostic imaging of other abdominal regions, including retroperitoneum: Secondary | ICD-10-CM

## 2023-03-23 DIAGNOSIS — K8689 Other specified diseases of pancreas: Secondary | ICD-10-CM

## 2023-03-23 DIAGNOSIS — R634 Abnormal weight loss: Secondary | ICD-10-CM

## 2023-03-24 ENCOUNTER — Other Ambulatory Visit: Payer: 59

## 2023-03-25 ENCOUNTER — Other Ambulatory Visit: Payer: 59

## 2023-03-25 DIAGNOSIS — K8689 Other specified diseases of pancreas: Secondary | ICD-10-CM | POA: Diagnosis not present

## 2023-03-25 DIAGNOSIS — R935 Abnormal findings on diagnostic imaging of other abdominal regions, including retroperitoneum: Secondary | ICD-10-CM | POA: Diagnosis not present

## 2023-03-25 DIAGNOSIS — K838 Other specified diseases of biliary tract: Secondary | ICD-10-CM | POA: Diagnosis not present

## 2023-03-25 DIAGNOSIS — R634 Abnormal weight loss: Secondary | ICD-10-CM | POA: Diagnosis not present

## 2023-04-08 ENCOUNTER — Other Ambulatory Visit: Payer: 59

## 2023-04-14 ENCOUNTER — Emergency Department
Admission: EM | Admit: 2023-04-14 | Discharge: 2023-04-14 | Disposition: A | Discharge status: Home / Self Care | Payer: 59 | Attending: Emergency Medicine | Admitting: Emergency Medicine

## 2023-04-14 ENCOUNTER — Emergency Department: Payer: 59

## 2023-04-14 ENCOUNTER — Encounter: Payer: Self-pay | Admitting: Emergency Medicine

## 2023-04-14 ENCOUNTER — Ambulatory Visit
Admission: RE | Admit: 2023-04-14 | Discharge: 2023-04-14 | Disposition: A | Discharge status: Home / Self Care | Payer: Medicaid Other | Source: Ambulatory Visit | Attending: Gastroenterology

## 2023-04-14 ENCOUNTER — Other Ambulatory Visit: Payer: Self-pay

## 2023-04-14 DIAGNOSIS — L0882 Omphalitis not of newborn: Secondary | ICD-10-CM | POA: Diagnosis not present

## 2023-04-14 DIAGNOSIS — K8689 Other specified diseases of pancreas: Secondary | ICD-10-CM

## 2023-04-14 DIAGNOSIS — R1084 Generalized abdominal pain: Secondary | ICD-10-CM

## 2023-04-14 DIAGNOSIS — K449 Diaphragmatic hernia without obstruction or gangrene: Secondary | ICD-10-CM | POA: Diagnosis not present

## 2023-04-14 DIAGNOSIS — I10 Essential (primary) hypertension: Secondary | ICD-10-CM | POA: Insufficient documentation

## 2023-04-14 DIAGNOSIS — G20A1 Parkinson's disease without dyskinesia, without mention of fluctuations: Secondary | ICD-10-CM | POA: Insufficient documentation

## 2023-04-14 DIAGNOSIS — R634 Abnormal weight loss: Secondary | ICD-10-CM

## 2023-04-14 DIAGNOSIS — K838 Other specified diseases of biliary tract: Secondary | ICD-10-CM

## 2023-04-14 DIAGNOSIS — R109 Unspecified abdominal pain: Secondary | ICD-10-CM | POA: Diagnosis not present

## 2023-04-14 DIAGNOSIS — J449 Chronic obstructive pulmonary disease, unspecified: Secondary | ICD-10-CM | POA: Insufficient documentation

## 2023-04-14 DIAGNOSIS — R935 Abnormal findings on diagnostic imaging of other abdominal regions, including retroperitoneum: Secondary | ICD-10-CM

## 2023-04-14 LAB — URINALYSIS, W/ REFLEX TO CULTURE (INFECTION SUSPECTED)
Bilirubin Urine: NEGATIVE
Glucose, UA: NEGATIVE mg/dL
Hgb urine dipstick: NEGATIVE
Ketones, ur: NEGATIVE mg/dL
Nitrite: NEGATIVE
Protein, ur: NEGATIVE mg/dL
Specific Gravity, Urine: 1.023 (ref 1.005–1.030)
pH: 6 (ref 5.0–8.0)

## 2023-04-14 LAB — COMPREHENSIVE METABOLIC PANEL
ALT: 12 U/L (ref 0–44)
AST: 14 U/L — ABNORMAL LOW (ref 15–41)
Albumin: 3.9 g/dL (ref 3.5–5.0)
Alkaline Phosphatase: 67 U/L (ref 38–126)
Anion gap: 4 — ABNORMAL LOW (ref 5–15)
BUN: 28 mg/dL — ABNORMAL HIGH (ref 8–23)
CO2: 27 mmol/L (ref 22–32)
Calcium: 8.5 mg/dL — ABNORMAL LOW (ref 8.9–10.3)
Chloride: 105 mmol/L (ref 98–111)
Creatinine, Ser: 0.88 mg/dL (ref 0.44–1.00)
GFR, Estimated: >60 mL/min (ref >60)
Glucose, Bld: 95 mg/dL (ref 70–99)
Potassium: 3.8 mmol/L (ref 3.5–5.1)
Sodium: 136 mmol/L (ref 135–145)
Total Bilirubin: 0.6 mg/dL (ref 0.3–1.2)
Total Protein: 7 g/dL (ref 6.5–8.1)

## 2023-04-14 LAB — CBC WITH DIFFERENTIAL/PLATELET
Abs Immature Granulocytes: 0.01 10*3/uL (ref 0.00–0.07)
Basophils Absolute: 0.1 10*3/uL (ref 0.0–0.1)
Basophils Relative: 1 %
Eosinophils Absolute: 0.3 10*3/uL (ref 0.0–0.5)
Eosinophils Relative: 4 %
HCT: 36.6 % (ref 36.0–46.0)
Hemoglobin: 11.9 g/dL — ABNORMAL LOW (ref 12.0–15.0)
Immature Granulocytes: 0 %
Lymphocytes Relative: 33 %
Lymphs Abs: 2.3 10*3/uL (ref 0.7–4.0)
MCH: 30.1 pg (ref 26.0–34.0)
MCHC: 32.5 g/dL (ref 30.0–36.0)
MCV: 92.7 fL (ref 80.0–100.0)
Monocytes Absolute: 0.7 10*3/uL (ref 0.1–1.0)
Monocytes Relative: 10 %
Neutro Abs: 3.7 10*3/uL (ref 1.7–7.7)
Neutrophils Relative %: 52 %
Platelets: 288 10*3/uL (ref 150–400)
RBC: 3.95 MIL/uL (ref 3.87–5.11)
RDW: 14.2 % (ref 11.5–15.5)
WBC: 7 10*3/uL (ref 4.0–10.5)
nRBC: 0 % (ref 0.0–0.2)

## 2023-04-14 LAB — LACTIC ACID, PLASMA: Lactic Acid, Venous: 1 mmol/L (ref 0.5–1.9)

## 2023-04-14 MED ORDER — LACTATED RINGERS IV BOLUS
1000.0000 mL | Freq: Once | INTRAVENOUS | Status: AC
  Administered 2023-04-14: 1000 mL via INTRAVENOUS

## 2023-04-14 MED ORDER — ONDANSETRON HCL 4 MG/2ML IJ SOLN
4.0000 mg | Freq: Once | INTRAMUSCULAR | Status: AC
  Administered 2023-04-14: 4 mg via INTRAVENOUS
  Filled 2023-04-14: qty 2

## 2023-04-14 MED ORDER — IOHEXOL 300 MG/ML  SOLN
100.0000 mL | Freq: Once | INTRAMUSCULAR | Status: AC | PRN
  Administered 2023-04-14: 100 mL via INTRAVENOUS

## 2023-04-14 MED ORDER — KETOROLAC TROMETHAMINE 30 MG/ML IJ SOLN
15.0000 mg | Freq: Once | INTRAMUSCULAR | Status: AC
  Administered 2023-04-14: 15 mg via INTRAVENOUS
  Filled 2023-04-14: qty 1

## 2023-04-14 MED ORDER — MORPHINE SULFATE (PF) 4 MG/ML IV SOLN
4.0000 mg | Freq: Once | INTRAVENOUS | Status: AC
  Administered 2023-04-14: 4 mg via INTRAVENOUS
  Filled 2023-04-14: qty 1

## 2023-04-14 MED ORDER — CEPHALEXIN 500 MG PO CAPS: 500.0000 mg | ORAL_CAPSULE | Freq: Four times a day (QID) | ORAL | 0 refills | Status: AC

## 2023-04-14 MED ORDER — GADOPICLENOL 0.5 MMOL/ML IV SOLN
7.5000 mL | Freq: Once | INTRAVENOUS | Status: AC | PRN
  Administered 2023-04-14: 7.5 mL via INTRAVENOUS

## 2023-04-14 NOTE — ED Notes (Signed)
ED Provider at bedside to discuss pain control and poc.

## 2023-04-14 NOTE — ED Triage Notes (Signed)
Pt in with low abdominal pain and wound with drainage to umbilicus area. Pt states she smells "fecal drainage" coming from site. Had MRI of abdomen today at outpt center, sent to ED by Dr. Okey Dupre.

## 2023-04-14 NOTE — ED Notes (Signed)
RN went to give medication and pt had all ready disconnected herself from everything and was sitting in a wheelchair laughing and joking. Pt states she needs to go now and will go to pain clinic since this wont help her enough. RN asked if she would stay so she could tell provider since initial plan was to wait for results. MD notified.

## 2023-04-14 NOTE — ED Notes (Addendum)
ED Provider at bedside. Pt agreeable to waiting for paperwork.

## 2023-04-14 NOTE — ED Notes (Signed)
Pt requesting to go d/t family emergency; provider notified.

## 2023-04-14 NOTE — ED Notes (Signed)
Pt states she is here for pain control for a "mass on her pancrease." Pt stated she had a MRI earlier today.

## 2023-04-14 NOTE — ED Notes (Signed)
RN went to talk to pt about what dayshift rn reported to this rn. Pt upset and is angry she hasn't been given anymore pain medicine. RN told pt there are no orders at this time but I would ask ptrovider. Pt states "I can't do this anymore I need pain meds now!' Pt got out of the bed and started ripping off leads. Pt then repeated her request for pain meds and wanting an update. RN asked if there was a family emergency and if she needed to go. Pt stated "yea my daughter just called, but I need pain medicine" Pt got back into bed and RN stated she would let the doctor know. Pt states she wants him to come to her room now!

## 2023-04-14 NOTE — ED Notes (Signed)
Pt hooked up to bedside monitor, warm blanket given, and call bell within reach.
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
## 2023-06-03 DIAGNOSIS — K449 Diaphragmatic hernia without obstruction or gangrene: Secondary | ICD-10-CM | POA: Diagnosis not present

## 2023-06-03 DIAGNOSIS — N823 Fistula of vagina to large intestine: Secondary | ICD-10-CM | POA: Diagnosis not present

## 2023-06-03 DIAGNOSIS — L03311 Cellulitis of abdominal wall: Secondary | ICD-10-CM | POA: Diagnosis not present

## 2023-06-03 DIAGNOSIS — M799 Soft tissue disorder, unspecified: Secondary | ICD-10-CM | POA: Diagnosis not present

## 2023-06-03 DIAGNOSIS — S32029A Unspecified fracture of second lumbar vertebra, initial encounter for closed fracture: Secondary | ICD-10-CM | POA: Diagnosis not present

## 2023-06-03 DIAGNOSIS — K651 Peritoneal abscess: Secondary | ICD-10-CM | POA: Diagnosis not present

## 2023-06-03 DIAGNOSIS — K838 Other specified diseases of biliary tract: Secondary | ICD-10-CM | POA: Diagnosis not present

## 2023-06-04 DIAGNOSIS — N823 Fistula of vagina to large intestine: Secondary | ICD-10-CM | POA: Diagnosis not present

## 2023-06-04 DIAGNOSIS — M4848XA Fatigue fracture of vertebra, sacral and sacrococcygeal region, initial encounter for fracture: Secondary | ICD-10-CM | POA: Diagnosis not present

## 2023-06-05 DIAGNOSIS — N823 Fistula of vagina to large intestine: Secondary | ICD-10-CM | POA: Diagnosis not present

## 2023-06-05 DIAGNOSIS — M8448XA Pathological fracture, other site, initial encounter for fracture: Secondary | ICD-10-CM | POA: Diagnosis not present

## 2023-06-10 DIAGNOSIS — Z885 Allergy status to narcotic agent status: Secondary | ICD-10-CM | POA: Diagnosis not present

## 2023-06-10 DIAGNOSIS — I456 Pre-excitation syndrome: Secondary | ICD-10-CM | POA: Diagnosis not present

## 2023-06-10 DIAGNOSIS — Z87891 Personal history of nicotine dependence: Secondary | ICD-10-CM | POA: Diagnosis not present

## 2023-06-10 DIAGNOSIS — R1084 Generalized abdominal pain: Secondary | ICD-10-CM | POA: Diagnosis not present

## 2023-06-10 DIAGNOSIS — N823 Fistula of vagina to large intestine: Secondary | ICD-10-CM | POA: Diagnosis not present

## 2023-06-10 DIAGNOSIS — G5600 Carpal tunnel syndrome, unspecified upper limb: Secondary | ICD-10-CM | POA: Diagnosis not present

## 2023-06-10 DIAGNOSIS — J449 Chronic obstructive pulmonary disease, unspecified: Secondary | ICD-10-CM | POA: Diagnosis not present

## 2023-06-10 DIAGNOSIS — Z881 Allergy status to other antibiotic agents status: Secondary | ICD-10-CM | POA: Diagnosis not present

## 2023-06-10 DIAGNOSIS — R1013 Epigastric pain: Secondary | ICD-10-CM | POA: Diagnosis not present

## 2023-06-10 DIAGNOSIS — R109 Unspecified abdominal pain: Secondary | ICD-10-CM | POA: Diagnosis not present

## 2023-06-10 DIAGNOSIS — K219 Gastro-esophageal reflux disease without esophagitis: Secondary | ICD-10-CM | POA: Diagnosis not present

## 2023-06-10 DIAGNOSIS — F419 Anxiety disorder, unspecified: Secondary | ICD-10-CM | POA: Diagnosis not present

## 2023-06-10 DIAGNOSIS — I10 Essential (primary) hypertension: Secondary | ICD-10-CM | POA: Diagnosis not present

## 2023-06-10 DIAGNOSIS — Z79899 Other long term (current) drug therapy: Secondary | ICD-10-CM | POA: Diagnosis not present

## 2023-06-10 DIAGNOSIS — I252 Old myocardial infarction: Secondary | ICD-10-CM | POA: Diagnosis not present

## 2023-07-09 DIAGNOSIS — R109 Unspecified abdominal pain: Secondary | ICD-10-CM | POA: Diagnosis not present

## 2023-07-09 DIAGNOSIS — K279 Peptic ulcer, site unspecified, unspecified as acute or chronic, without hemorrhage or perforation: Secondary | ICD-10-CM | POA: Diagnosis not present

## 2023-07-09 DIAGNOSIS — I10 Essential (primary) hypertension: Secondary | ICD-10-CM | POA: Diagnosis not present

## 2023-07-09 DIAGNOSIS — Z8719 Personal history of other diseases of the digestive system: Secondary | ICD-10-CM | POA: Diagnosis not present

## 2023-07-09 DIAGNOSIS — E785 Hyperlipidemia, unspecified: Secondary | ICD-10-CM | POA: Diagnosis not present

## 2023-11-26 ENCOUNTER — Ambulatory Visit: Payer: MEDICAID | Admitting: Podiatry

## 2023-11-26 ENCOUNTER — Telehealth: Payer: Self-pay | Admitting: Podiatry

## 2023-11-26 NOTE — Telephone Encounter (Signed)
 Pt asked if she can be worked in today, her transportation didn't show.

## 2023-11-30 ENCOUNTER — Ambulatory Visit (INDEPENDENT_AMBULATORY_CARE_PROVIDER_SITE_OTHER): Payer: MEDICAID

## 2023-11-30 ENCOUNTER — Ambulatory Visit: Payer: MEDICAID | Admitting: Podiatry

## 2023-11-30 DIAGNOSIS — M2041 Other hammer toe(s) (acquired), right foot: Secondary | ICD-10-CM

## 2023-11-30 DIAGNOSIS — B351 Tinea unguium: Secondary | ICD-10-CM | POA: Diagnosis not present

## 2023-11-30 DIAGNOSIS — M79674 Pain in right toe(s): Secondary | ICD-10-CM

## 2023-11-30 DIAGNOSIS — D2371 Other benign neoplasm of skin of right lower limb, including hip: Secondary | ICD-10-CM | POA: Diagnosis not present

## 2023-11-30 MED ORDER — CLOTRIMAZOLE-BETAMETHASONE 1-0.05 % EX CREA: 1.0000 | TOPICAL_CREAM | Freq: Every day | CUTANEOUS | 2 refills | Status: DC

## 2023-11-30 NOTE — Progress Notes (Signed)
   Chief Complaint  Patient presents with   Toe Pain    "My toes are curling under." N - toes curling L - 3,4 toes right D - 6 mos O - gradually worse C - curling under, sharp shooting pains, muscle spasms A - walking, standing T - wrap with Lidocaine  patch   Callouses    "    HPI: 63 y.o. female presenting today for evaluation of right forefoot pain.  She has a very symptomatic painful skin lesion to the plantar aspect of the right foot at the level of the third MTP.  She says it is extremely painful.  She also has a hammertoe to the third digit as well. Patient states that the forefoot is so painful that she is unable to trim her own nails.  They are nail long and dystrophic and very sensitive especially in close toed shoes  Past Medical History:  Diagnosis Date   Diverticulitis    Hypertension    S/P ablation operation for arrhythmia    for WPW   Wolf-Parkinson-White syndrome     Past Surgical History:  Procedure Laterality Date   ABDOMINAL SURGERY     CHOLECYSTECTOMY     COLONOSCOPY     COLOSTOMY     COLOSTOMY REVISION      Allergies  Allergen Reactions   Ciprofloxacin Rash   Morphine  Rash     Physical Exam: General: The patient is alert and oriented x3 in no acute distress.  Dermatology: Diffuse hyperkeratosis of skin with peeling noted.  Hyperkeratotic skin lesion also noted to the plantar aspect of the third MTP right foot with a central nucleated core  Vascular: Palpable pedal pulses bilaterally. Capillary refill within normal limits.  No appreciable edema.  No erythema.  Neurological: Grossly intact via light touch  Musculoskeletal Exam: Hammertoe contracture deformity noted to the third digit of the right foot creating retrograde pressure to the third MTP.  Radiographic Exam RT foot 11/30/2023:  Advanced severe degenerative changes noted to the first MTP.  Hammertoe contracture deformity noted to the lesser digits of the right foot.   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 Xrays reviewed -Today we discussed hammertoe corrective surgery to correct for the symptomatic hammertoe and retrograde pressure causing pain to the third MTP.  Unfortunately patient is currently dealing with GI issues and may require additional surgeries.  For now we will continue to pursue conservative care -Excisional debridement of eccrine poroma performed using a 312 scalpel without incident. Salicylic acid applied with a bandaid -Recommend OTC salicylic acid daily under occlusion  -Prescription for lotrisone  cream apply twice daily -Mechanical debridement of nails right foot 1-5 performed using a nail nipper without incident or bleeding -Return to clinic 4 weeks     Dot Gazella, DPM [facility]  Dr. Dot Gazella, DPM    [address] [place], [place] [address]                Office [phone]  Fax [phone]

## 2023-12-23 ENCOUNTER — Telehealth: Payer: Self-pay | Admitting: Podiatry

## 2023-12-23 NOTE — Telephone Encounter (Signed)
 Patient is requesting pain medication for pain in right foot

## 2023-12-30 ENCOUNTER — Telehealth: Payer: Self-pay | Admitting: Podiatry

## 2023-12-30 NOTE — Telephone Encounter (Signed)
 Pt is requesting to move forward w/ having surgery. Do you/we need to sch appointment to further discuss?

## 2023-12-30 NOTE — Telephone Encounter (Signed)
 Pt called on 6/12 requesting something for pain with no response. She is struggling to bear wt on it.  Please have sent to :  [facility] - [place], [place] - [address] Phone: [phone]  Fax: [phone]

## 2024-01-13 ENCOUNTER — Telehealth: Payer: Self-pay

## 2024-01-13 NOTE — Telephone Encounter (Signed)
 Patient called - her foot pain is getting worse, OTC NSAIDS are not helping - She is asking for you to send pain medicine to her pharmacy - she has an appointment on 01/25/24

## 2024-01-17 ENCOUNTER — Other Ambulatory Visit: Payer: Self-pay | Admitting: Podiatry

## 2024-01-17 MED ORDER — TRAMADOL HCL 50 MG PO TABS: 50.0000 mg | ORAL_TABLET | Freq: Four times a day (QID) | ORAL | 0 refills | Status: DC | PRN

## 2024-01-17 NOTE — Progress Notes (Signed)
 Rx Tramadol  50mg  PRN pain

## 2024-01-25 ENCOUNTER — Ambulatory Visit: Payer: MEDICAID | Admitting: Podiatry

## 2024-01-25 VITALS — Ht 62.0 in | Wt 131.0 lb

## 2024-01-25 DIAGNOSIS — M2041 Other hammer toe(s) (acquired), right foot: Secondary | ICD-10-CM

## 2024-01-25 DIAGNOSIS — M216X1 Other acquired deformities of right foot: Secondary | ICD-10-CM

## 2024-01-25 NOTE — Progress Notes (Unsigned)
   Chief Complaint  Patient presents with   Plantar Warts    Pt is here due to plantar wart on the bottom of the right foot, and she has a spot on the right 4th toe that is bothering her as well.    HPI: 63 y.o. female presenting today for evaluation of right forefoot pain.  She has a very symptomatic painful skin lesion to the plantar aspect of the right foot at the level of the third MTP.  She says it is extremely painful.  She also has a hammertoe to the third digit as well. Patient states that the forefoot is so painful that she is unable to trim her own nails.  They are nail long and dystrophic and very sensitive especially in close toed shoes  Past Medical History:  Diagnosis Date   Diverticulitis    Hypertension    S/P ablation operation for arrhythmia    for WPW   Wolf-Parkinson-White syndrome     Past Surgical History:  Procedure Laterality Date   ABDOMINAL SURGERY     CHOLECYSTECTOMY     COLONOSCOPY     COLOSTOMY     COLOSTOMY REVISION      Allergies  Allergen Reactions   Ciprofloxacin Rash   Morphine  Rash     Physical Exam: General: The patient is alert and oriented x3 in no acute distress.  Dermatology: Diffuse hyperkeratosis of skin with peeling noted.  Hyperkeratotic skin lesion also noted to the plantar aspect of the third MTP right foot with a central nucleated core  Vascular: Palpable pedal pulses bilaterally. Capillary refill within normal limits.  No appreciable edema.  No erythema.  Neurological: Grossly intact via light touch  Musculoskeletal Exam: Hammertoe contracture deformity noted to the third digit of the right foot creating retrograde pressure to the third MTP.  Radiographic Exam RT foot 11/30/2023:  Advanced severe degenerative changes noted to the first MTP.  Hammertoe contracture deformity noted to the lesser digits of the right foot.   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 Xrays reviewed - Authorization for surgery was initiated today.  Surgery will consist of floating metatarsal osteotomy third right.  Flexor tenotomy digits 3, 4 right.  Metatarsophalangeal joint capsulotomy 3, 4 right. -Return to clinic 1 week postop     Thresa EMERSON Sar, DPM [facility]  Dr. Thresa EMERSON Sar, DPM    [address] [place], [place] [address]                Office [phone]  Fax [phone]

## 2024-01-27 ENCOUNTER — Telehealth: Payer: Self-pay | Admitting: Podiatry

## 2024-01-27 NOTE — Telephone Encounter (Signed)
Patient needs refill on oxycodone.

## 2024-01-28 ENCOUNTER — Telehealth: Payer: Self-pay | Admitting: Podiatry

## 2024-01-28 NOTE — Telephone Encounter (Signed)
 Received surgical consent forms  Called and voicemail is full so not able to leave a message for pt to call to schedule surgery. Will call next week.

## 2024-02-01 ENCOUNTER — Other Ambulatory Visit: Payer: Self-pay | Admitting: Podiatry

## 2024-02-01 MED ORDER — TRAMADOL HCL 50 MG PO TABS: 50.0000 mg | ORAL_TABLET | Freq: Four times a day (QID) | ORAL | 0 refills | Status: AC | PRN

## 2024-02-01 NOTE — Progress Notes (Signed)
PRN chronic foot pain

## 2024-02-03 ENCOUNTER — Telehealth: Payer: Self-pay | Admitting: Podiatry

## 2024-02-03 NOTE — Telephone Encounter (Signed)
 Received surgical consent forms   Called pt to get scheduled for surgery and mailbox is full so not able to leave a message.

## 2024-02-09 ENCOUNTER — Telehealth: Payer: Self-pay | Admitting: Podiatry

## 2024-02-09 NOTE — Telephone Encounter (Signed)
 Patient called to see if she can get a refill of her oxycodone  medication.

## 2024-02-09 NOTE — Telephone Encounter (Signed)
 Patient called to request muscle relaxer. Patient stated toes are cramping.

## 2024-02-09 NOTE — Telephone Encounter (Signed)
 Pt called to see if her surgery has been scheduled and upon checking I had called and her mailbox was full so I was not able to leave a message.  We have here scheduled for 8/21 and she is not on any blood thinners or GLP1 medications and preferred pharmacy is correct in chart

## 2024-02-11 ENCOUNTER — Other Ambulatory Visit: Payer: Self-pay | Admitting: Podiatry

## 2024-02-11 ENCOUNTER — Telehealth: Payer: Self-pay | Admitting: Podiatry

## 2024-02-11 MED ORDER — CYCLOBENZAPRINE HCL 10 MG PO TABS: 10.0000 mg | ORAL_TABLET | Freq: Three times a day (TID) | ORAL | 0 refills | Status: AC | PRN

## 2024-02-11 NOTE — Telephone Encounter (Signed)
 Toes are drawing up and bending under her foot.  Needs something for pain before the weekend.

## 2024-02-11 NOTE — Telephone Encounter (Signed)
 Patient calling again for oxycodone  and a muscle relaxer.

## 2024-02-14 ENCOUNTER — Telehealth: Payer: Self-pay | Admitting: Podiatry

## 2024-02-14 NOTE — Telephone Encounter (Signed)
 Patient needs something for pain.  Flexeri is not working for her.  Can she get something different.

## 2024-02-15 ENCOUNTER — Telehealth: Payer: Self-pay | Admitting: Podiatry

## 2024-02-15 NOTE — Telephone Encounter (Signed)
 Called last Friday and 02/14/2024 for prescription, missed called from [facility] friday

## 2024-02-15 NOTE — Telephone Encounter (Signed)
 Pt left message this morning on my voicemail stating she called about pain medication and has not heard back. Her surgery is scheduled for 8/21 and she asked if we had anything sooner and we do not as of now. Please call pt about pain medication.

## 2024-02-17 ENCOUNTER — Telehealth: Payer: Self-pay | Admitting: Podiatry

## 2024-02-17 NOTE — Telephone Encounter (Signed)
 Patient is experiencing pain on right foot first four toes, and requesting pain medication. Patient would like for Dr. Janit to give her a call today, before pharmacy close.

## 2024-02-17 NOTE — Telephone Encounter (Signed)
 I have been trying to reach patient for the last two days Dr. Artist called in tramadol  and will not be prescribing percocet prior to surgery.

## 2024-02-21 ENCOUNTER — Telehealth: Payer: Self-pay | Admitting: Podiatry

## 2024-02-21 NOTE — Telephone Encounter (Signed)
 Pt left a message asking for a call back and asking about moving surgery to sooner.   I returned call and not able to leave a message as voicemail is full. But as on now Dr Janit does not have any availability to move the surgery to sooner.

## 2024-02-21 NOTE — Telephone Encounter (Signed)
 Pt left another message today at 352pm asking about getting surgery asap.   I returned call and voicemail is full. The providers schedule is full and I cannot move surgery up.

## 2024-02-21 NOTE — Telephone Encounter (Signed)
 DOS- 03/02/2024  3RD AND 4TH TENOTOMY SINGLE RT- 28010 3RD METATARSAL OSTEOTOMY 2-5 RT- 28308  [facility] HEALTH EFFECTIVE DATE- 11/11/2023  PER [facility] WITH [facility] AND LOOKING UP CPT CODES ON [facility] [place], NO PRIOR AUTHORIZATIONS ARE REQUIRED FOR CPT CODES 71989 AND [phone]. REF# [id number] 02/21/2024

## 2024-02-22 ENCOUNTER — Ambulatory Visit (INDEPENDENT_AMBULATORY_CARE_PROVIDER_SITE_OTHER): Payer: MEDICAID | Admitting: Podiatry

## 2024-02-22 ENCOUNTER — Encounter: Payer: Self-pay | Admitting: Podiatry

## 2024-02-22 VITALS — Ht 62.0 in | Wt 131.0 lb

## 2024-02-22 DIAGNOSIS — M2041 Other hammer toe(s) (acquired), right foot: Secondary | ICD-10-CM | POA: Diagnosis not present

## 2024-02-22 MED ORDER — OXYCODONE-ACETAMINOPHEN 5-325 MG PO TABS
1.0000 | ORAL_TABLET | Freq: Three times a day (TID) | ORAL | 0 refills | Status: DC | PRN
Start: 2024-02-22 — End: 2024-03-02

## 2024-02-22 NOTE — Progress Notes (Signed)
   Chief Complaint  Patient presents with   Foot Pain    Pt is here due to right foot issues, surgery is schedule for 8/21, complains of wart on the bottom of her foot that is painful to walk on and 2-4 toes are bothering her as well.    HPI: 63 y.o. female presenting today for evaluation of right forefoot pain.  She has a very symptomatic painful skin lesion to the plantar aspect of the right foot at the level of the third MTP.  She says it is extremely painful.  She also has a hammertoe to the third digit as well.  Past Medical History:  Diagnosis Date   Diverticulitis    Hypertension    S/P ablation operation for arrhythmia    for WPW   Wolf-Parkinson-White syndrome     Past Surgical History:  Procedure Laterality Date   ABDOMINAL SURGERY     CHOLECYSTECTOMY     COLONOSCOPY     COLOSTOMY     COLOSTOMY REVISION      Allergies  Allergen Reactions   Ciprofloxacin Rash   Morphine  Rash     Physical Exam: General: The patient is alert and oriented x3 in no acute distress.  Dermatology: Diffuse hyperkeratosis of skin with peeling noted.  Hyperkeratotic skin lesion also noted to the plantar aspect of the third MTP right foot with a central nucleated core  Vascular: Palpable pedal pulses bilaterally. Capillary refill within normal limits.  No appreciable edema.  No erythema.  Neurological: Grossly intact via light touch  Musculoskeletal Exam: Hammertoe contracture deformity noted to the third digit of the right foot creating retrograde pressure to the third MTP.  Radiographic Exam RT foot 11/30/2023:  Advanced severe degenerative changes noted to the first MTP.  Hammertoe contracture deformity noted to the lesser digits of the right foot.   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 Xrays reviewed -Today again we discussed hammertoe corrective surgery to  correct for the symptomatic hammertoe and retrograde pressure causing pain to the third MTP.  she continues to have pain and tenderness despite shoe gear modifications and offloading techniques.   -Prescription for Percocet 5/325 mg Q8H PRN severe pain 7-10 -Authorization for surgery was initiated last visit 01/25/2024.  Surgery will consist of hammertoe repair of digits 2, 3 right foot.  Floating osteotomy of the second third metatarsals of the right foot... -Return to clinic 1 week postop     Thresa EMERSON Sar, DPM [facility]  Dr. Thresa EMERSON Sar, DPM    [address] [place], [place] [address]                Office [phone]  Fax [phone]

## 2024-03-02 ENCOUNTER — Other Ambulatory Visit: Payer: Self-pay | Admitting: Podiatry

## 2024-03-02 DIAGNOSIS — M216X1 Other acquired deformities of right foot: Secondary | ICD-10-CM | POA: Diagnosis not present

## 2024-03-02 DIAGNOSIS — M2041 Other hammer toe(s) (acquired), right foot: Secondary | ICD-10-CM | POA: Diagnosis not present

## 2024-03-02 MED ORDER — OXYCODONE-ACETAMINOPHEN 5-325 MG PO TABS: 1.0000 | ORAL_TABLET | ORAL | 0 refills | Status: DC | PRN

## 2024-03-02 MED ORDER — IBUPROFEN 800 MG PO TABS: 800.0000 mg | ORAL_TABLET | Freq: Three times a day (TID) | ORAL | 1 refills | Status: DC

## 2024-03-02 NOTE — Progress Notes (Signed)
.  postop

## 2024-03-10 ENCOUNTER — Encounter: Payer: MEDICAID | Admitting: Podiatry

## 2024-03-17 ENCOUNTER — Encounter: Payer: MEDICAID | Admitting: Podiatry

## 2024-04-04 ENCOUNTER — Encounter: Payer: MEDICAID | Admitting: Podiatry

## 2024-06-05 ENCOUNTER — Other Ambulatory Visit: Payer: Self-pay

## 2024-06-05 ENCOUNTER — Emergency Department
Admission: EM | Admit: 2024-06-05 | Discharge: 2024-06-05 | Disposition: A | Discharge status: Home / Self Care | Payer: MEDICAID | Attending: Emergency Medicine | Admitting: Emergency Medicine

## 2024-06-05 ENCOUNTER — Emergency Department: Payer: MEDICAID

## 2024-06-05 DIAGNOSIS — S00531A Contusion of lip, initial encounter: Secondary | ICD-10-CM | POA: Diagnosis not present

## 2024-06-05 DIAGNOSIS — M25551 Pain in right hip: Secondary | ICD-10-CM | POA: Diagnosis not present

## 2024-06-05 DIAGNOSIS — R519 Headache, unspecified: Secondary | ICD-10-CM | POA: Diagnosis not present

## 2024-06-05 DIAGNOSIS — S0993XA Unspecified injury of face, initial encounter: Secondary | ICD-10-CM | POA: Diagnosis present

## 2024-06-05 MED ORDER — HYDROCODONE-ACETAMINOPHEN 5-325 MG PO TABS: 1.0000 | ORAL_TABLET | ORAL | 0 refills | Status: DC | PRN

## 2024-06-05 MED ORDER — HYDROCODONE-ACETAMINOPHEN 5-325 MG PO TABS
1.0000 | ORAL_TABLET | Freq: Once | ORAL | Status: AC
  Administered 2024-06-05: 1 via ORAL
  Filled 2024-06-05: qty 1
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
## 2024-07-03 ENCOUNTER — Emergency Department
Admission: EM | Admit: 2024-07-03 | Discharge: 2024-07-03 | Disposition: A | Discharge status: Home / Self Care | Attending: Emergency Medicine | Admitting: Emergency Medicine

## 2024-07-03 ENCOUNTER — Other Ambulatory Visit: Payer: Self-pay

## 2024-07-03 ENCOUNTER — Emergency Department

## 2024-07-03 DIAGNOSIS — R1031 Right lower quadrant pain: Secondary | ICD-10-CM | POA: Insufficient documentation

## 2024-07-03 DIAGNOSIS — R1013 Epigastric pain: Secondary | ICD-10-CM

## 2024-07-03 LAB — CBC WITH DIFFERENTIAL/PLATELET
Abs Immature Granulocytes: 0.02 K/uL (ref 0.00–0.07)
Basophils Absolute: 0.1 K/uL (ref 0.0–0.1)
Basophils Relative: 1 %
Eosinophils Absolute: 0.2 K/uL (ref 0.0–0.5)
Eosinophils Relative: 3 %
HCT: 37.6 % (ref 36.0–46.0)
Hemoglobin: 12.3 g/dL (ref 12.0–15.0)
Immature Granulocytes: 0 %
Lymphocytes Relative: 30 %
Lymphs Abs: 2.4 K/uL (ref 0.7–4.0)
MCH: 30.2 pg (ref 26.0–34.0)
MCHC: 32.7 g/dL (ref 30.0–36.0)
MCV: 92.4 fL (ref 80.0–100.0)
Monocytes Absolute: 0.8 K/uL (ref 0.1–1.0)
Monocytes Relative: 10 %
Neutro Abs: 4.5 K/uL (ref 1.7–7.7)
Neutrophils Relative %: 56 %
Platelets: 271 K/uL (ref 150–400)
RBC: 4.07 MIL/uL (ref 3.87–5.11)
RDW: 14.3 % (ref 11.5–15.5)
WBC: 8.1 K/uL (ref 4.0–10.5)
nRBC: 0 % (ref 0.0–0.2)

## 2024-07-03 LAB — COMPREHENSIVE METABOLIC PANEL WITH GFR
ALT: 16 U/L (ref 0–44)
AST: 21 U/L (ref 15–41)
Albumin: 4.1 g/dL (ref 3.5–5.0)
Alkaline Phosphatase: 87 U/L (ref 38–126)
Anion gap: 11 (ref 5–15)
BUN: 26 mg/dL — ABNORMAL HIGH (ref 8–23)
CO2: 26 mmol/L (ref 22–32)
Calcium: 9.3 mg/dL (ref 8.9–10.3)
Chloride: 106 mmol/L (ref 98–111)
Creatinine, Ser: 0.92 mg/dL (ref 0.44–1.00)
GFR, Estimated: >60 mL/min
Glucose, Bld: 85 mg/dL (ref 70–99)
Potassium: 3.8 mmol/L (ref 3.5–5.1)
Sodium: 142 mmol/L (ref 135–145)
Total Bilirubin: 0.2 mg/dL (ref 0.0–1.2)
Total Protein: 6.6 g/dL (ref 6.5–8.1)

## 2024-07-03 LAB — LIPASE, BLOOD: Lipase: 23 U/L (ref 11–51)

## 2024-07-03 LAB — LACTIC ACID, PLASMA: Lactic Acid, Venous: 0.6 mmol/L (ref 0.5–1.9)

## 2024-07-03 LAB — TROPONIN T, HIGH SENSITIVITY: Troponin T High Sensitivity: <15 ng/L (ref 0–19)

## 2024-07-03 MED ORDER — IOHEXOL 300 MG/ML  SOLN
100.0000 mL | Freq: Once | INTRAMUSCULAR | Status: AC | PRN
  Administered 2024-07-03: 100 mL via INTRAVENOUS

## 2024-07-03 MED ORDER — LIDOCAINE 5 % EX PTCH: 1.0000 | MEDICATED_PATCH | CUTANEOUS | 0 refills | Status: AC

## 2024-07-03 MED ORDER — LIDOCAINE VISCOUS HCL 2 % MT SOLN
15.0000 mL | Freq: Once | OROMUCOSAL | Status: AC
  Administered 2024-07-03: 15 mL via ORAL
  Filled 2024-07-03: qty 15

## 2024-07-03 MED ORDER — ONDANSETRON HCL 4 MG/2ML IJ SOLN
4.0000 mg | INTRAMUSCULAR | Status: AC
  Administered 2024-07-03: 4 mg via INTRAVENOUS
  Filled 2024-07-03: qty 2

## 2024-07-03 MED ORDER — ACETAMINOPHEN 500 MG PO TABS: 1000.0000 mg | ORAL_TABLET | Freq: Four times a day (QID) | ORAL | 2 refills | Status: DC | PRN

## 2024-07-03 MED ORDER — ACETAMINOPHEN 500 MG PO TABS
1000.0000 mg | ORAL_TABLET | Freq: Once | ORAL | Status: AC
  Administered 2024-07-03: 1000 mg via ORAL
  Filled 2024-07-03: qty 2

## 2024-07-03 MED ORDER — KETOROLAC TROMETHAMINE 30 MG/ML IJ SOLN
15.0000 mg | Freq: Once | INTRAMUSCULAR | Status: AC
  Administered 2024-07-03: 15 mg via INTRAVENOUS
  Filled 2024-07-03: qty 1

## 2024-07-03 MED ORDER — ALUMINUM-MAGNESIUM-SIMETHICONE 200-200-20 MG/5ML PO SUSP: 30.0000 mL | Freq: Three times a day (TID) | ORAL | 0 refills | Status: DC

## 2024-07-03 MED ORDER — IOHEXOL 9 MG/ML PO SOLN
500.0000 mL | ORAL | Status: AC
  Administered 2024-07-03 (×2): 500 mL via ORAL

## 2024-07-03 MED ORDER — ALUM & MAG HYDROXIDE-SIMETH 200-200-20 MG/5ML PO SUSP
30.0000 mL | Freq: Once | ORAL | Status: AC
  Administered 2024-07-03: 30 mL via ORAL
  Filled 2024-07-03: qty 30

## 2024-07-03 MED ORDER — PANTOPRAZOLE SODIUM 40 MG IV SOLR
40.0000 mg | Freq: Once | INTRAVENOUS | Status: AC
  Administered 2024-07-03: 40 mg via INTRAVENOUS
  Filled 2024-07-03: qty 10

## 2024-07-03 MED ADMIN — Fentanyl Citrate PF Soln Prefilled Syringe 50 MCG/ML: 25 ug | INTRAVENOUS | NDC 63323080611

## 2024-07-03 MED FILL — Fentanyl Citrate PF Soln Prefilled Syringe 50 MCG/ML: 25.0000 ug | INTRAMUSCULAR | Qty: 1 | Status: AC

## 2024-07-03 NOTE — ED Provider Notes (Signed)
" °  Physical Exam  BP (!) 183/93   Pulse 65   Temp 97.6 F (36.4 C)   Resp (!) 21   Ht 5' 2 (1.575 m)   Wt 62.1 kg   SpO2 99%   BMI 25.06 kg/m   Physical Exam  Procedures  Procedures  ED Course / MDM   Clinical Course as of 07/03/24 0952  Mon Jul 03, 2024  9445 Reassuring lab work, no leukocytosis, normal metabolic panel and lipase [CF]  0704 Patient's labs are within normal limits.  I am transferring ED care to Dr. Clarine to follow-up on the CT scan and disposition appropriately. [CF]  0704 38F hx complicated diverticulitis p/w ab pain - labs reassuring - CT pending  To do: - f/u CT, getting oral contrast [MM]  0758 CTAP: IMPRESSION: 1. No acute findings in the abdomen or pelvis. 2. Large stool volume from the cecal tip to the rectum. Imaging features could be compatible with clinical constipation. 3. Patchy ground-glass opacity in the lung bases, right greater than left is likely atelectatic although infectious/inflammatory etiology cannot be excluded. 4. Compression deformity at T12 is new in the interval but age indeterminate. 5.  Aortic Atherosclerosis (ICD10-I70.0).   [MM]  [id number] Patient reevaluated.  Reassured by negative CT scan.  Does still endorse notable pain extending from the umbilicus up to the epigastrium.  Tells me she is having bowel movements, last 1 was about 1.5 days ago, is not having any difficulty with this --no indication for rectal disimpaction at this time.  Regarding lung findings on CT, denies any shortness of breath, cough, fevers -- do not clinically suspect pneumonia.  Abdominal exam with moderate tenderness on areas described above.  Doubt underlying mesenteric ischemic etiology, will add lactate.    Consider possibility of dyspepsia--Will add GI cocktail and reassess.  Denies any chest pain or other obvious cardiopulmonary symptoms.  I personally rereviewed EKG which shows no evidence of ischemia my interpretation, doubt cardiac etiology, will  add troponin. [MM]  0857 Lactate wnl, not c/w ischemic process [MM]  0904 Trop wnl, symptoms for far over 3 hours, no indication for repeat, not consistent with cardiac etiology presentation [MM]  [phone] Patient reevaluated, is feeling notably better after GI cocktail and reassured by unremarkable workup.  Does now remember that she helped a friend move 2 days ago and suspect she may have strain of her abdominal muscle wall.  Also notes she becomes very anxious anytime she has abdominal pain given her notable abdominal surgical history.  Fortunately no evidence of acute pathology today.  Do suspect some element of dyspepsia contributing, already on pantoprazole , will Rx Maalox to use in addition.  Recommend using Tylenol  and Lidoderm  patches for any ongoing abdominal wall discomfort.  Plan for PMD follow-up.  ED return precautions in place.  Patient agrees with plan. [MM]    Clinical Course User Index [CF] Gordan Huxley, MD [MM] Clarine Ozell LABOR, MD   Medical Decision Making Amount and/or Complexity of Data Reviewed Labs: ordered. Radiology: ordered.  Risk OTC drugs. Prescription drug management.          Clarine Ozell LABOR, MD 07/03/24 [phone]  "

## 2024-07-03 NOTE — ED Triage Notes (Signed)
 Patient with hx diverticulits and reversed colostomy, abdominal pain since yesterday 10/10  RLQ, denies N/V

## 2024-07-03 NOTE — ED Provider Notes (Signed)
 "  [facility] Provider Note    Event Date/Time   First MD Initiated Contact with Patient 07/03/24 [phone]     (approximate)   History   Abdominal Pain (Patient with hx diverticulits and reversed colostomy, abdominal pain since yesterday 10/10  RLQ, denies N/V)   HPI Alison Walker is a 63 y.o. female who presents for evaluation of abdominal pain that has been gradually worsening over the last 1 to 2 days.  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  She has also had a cholecystectomy.  She said that the pain started gradually in her lower abdomen but is now radiating throughout the abdomen and is severe.  She states that she feels bloated and tight, but has had no nausea nor vomiting.  She has been passing gas but not having much in the way of bowel movements.  She has had bowel obstructions in the past.  No fevers or chills, no chest pain or shortness of breath.     Physical Exam   Triage Vital Signs: ED Triage Vitals  Encounter Vitals Group     BP 07/03/24 0432 (!) 155/86     Girls Systolic BP Percentile --      Girls Diastolic BP Percentile --      Boys Systolic BP Percentile --      Boys Diastolic BP Percentile --      Pulse Rate 07/03/24 0432 75     Resp 07/03/24 0432 18     Temp 07/03/24 0432 97.6 F (36.4 C)     Temp src --      SpO2 07/03/24 0432 99 %     Weight 07/03/24 0438 62.1 kg (137 lb)     Height 07/03/24 0438 1.575 m (5' 2)     Head Circumference --      Peak Flow --      Pain Score 07/03/24 0436 10     Pain Loc --      Pain Education --      Exclude from Growth Chart --     Most recent vital signs: Vitals:   07/03/24 0432 07/03/24 0600  BP: (!) 155/86 (!) 183/93  Pulse: 75 65  Resp: 18 (!) 21  Temp: 97.6 F (36.4 C)   SpO2: 99% 99%    General: Awake, alert, pleasant.  She is tearful because of the pain and because she is scared but is nontoxic in  appearance. CV:  Good peripheral perfusion.  Regular rate and rhythm. Resp:  Normal effort. Speaking easily and comfortably, no accessory muscle usage nor intercostal retractions.   Abd:  No distention.  Abdomen is soft.  I do not appreciate any distention but the patient has mild generalized tenderness palpation throughout the abdomen, not consistent with peritonitis.  She has multiple well-healed surgical scars from her prior abdominal surgeries.  She has no tenderness to palpation of the epigastrium nor right upper quadrant.   ED Results / Procedures / Treatments   Labs (all labs ordered are listed, but only abnormal results are displayed) Labs Reviewed  COMPREHENSIVE METABOLIC PANEL WITH GFR - Abnormal; Notable for the following components:      Result Value   BUN 26 (*)    All other components within normal limits  CBC WITH DIFFERENTIAL/PLATELET  LIPASE, BLOOD     EKG  ED ECG REPORT I, Darleene Dome, the attending physician, personally viewed and interpreted this ECG.  Date: 07/03/2024 EKG Time: 5:15 AM Rate: 69 Rhythm: normal sinus rhythm QRS Axis: normal Intervals: Prolonged QTc with a QTc interval of 536 ms ST/T Wave abnormalities: Non-specific ST segment / T-wave changes, but no clear evidence of acute ischemia. Narrative Interpretation: no definitive evidence of acute ischemia; does not meet STEMI criteria.    RADIOLOGY See ED course for details   PROCEDURES:  Critical Care performed: No  Procedures    IMPRESSION / MDM / ASSESSMENT AND PLAN / ED COURSE  I reviewed the triage vital signs and the nursing notes.                              Differential diagnosis includes, but is not limited to, bowel obstruction, diverticulitis, appendicitis, mesenteric adenitis.  Patient's presentation is most consistent with acute presentation with potential threat to life or bodily function.  Labs/studies ordered: CMP, CBC with differential, lipase, CT of the  abdomen and pelvis  Interventions/Medications given:  Medications  iohexol  (OMNIPAQUE ) 9 MG/ML oral solution 500 mL (500 mLs Oral Contrast Given 07/03/24 0508)  ketorolac  (TORADOL ) 30 MG/ML injection 15 mg (15 mg Intravenous Given 07/03/24 0519)  ondansetron  (ZOFRAN ) injection 4 mg (4 mg Intravenous Given 07/03/24 0519)  fentaNYL  (SUBLIMAZE ) injection 25 mcg (25 mcg Intravenous Given 07/03/24 0521)    (Note:  hospital course my include additional interventions and/or labs/studies not listed above.)   Vital signs stable.  Patient is in a significant amount of discomfort and I ordered fentanyl  25 mcg IV because she reportedly has a rash allergy to morphine .  I also ordered Toradol  50 mg IV and Zofran  4 mg IV.  Labs and CT scan are pending.  I requested that she start drinking oral contrast so we can get the best possible image.  Patient is not vomiting and does not need any NG tube at this time.     Clinical Course as of 07/03/24 9290  [facility] Jul 03, 2024  9445 Reassuring lab work, no leukocytosis, normal metabolic panel and lipase [CF]  0704 Patient's labs are within normal limits.  I am transferring ED care to Dr. Clarine to follow-up on the CT scan and disposition appropriately. [CF]  0704 56F hx complicated diverticulitis p/w ab pain - labs reassuring - CT pending  To do: - f/u CT, getting oral contrast [MM]    Clinical Course User Index [CF] Gordan Huxley, MD [MM] Clarine Ozell LABOR, MD     FINAL CLINICAL IMPRESSION(S) / ED DIAGNOSES   Final diagnoses:  None     Rx / DC Orders   ED Discharge Orders     None        Note:  This document was prepared using Dragon voice recognition software and may include unintentional dictation errors.   Gordan Huxley, MD 07/03/24 [phone]  "

## 2024-07-03 NOTE — Discharge Instructions (Signed)
 Your evaluation in the emergency department was overall reassuring.  I suspect you may have had a strain of your abdominal muscle wall and you may have some irritation of the lining of your stomach.  I prescribed you Maalox, Tylenol , and Lidoderm  patches to use for any ongoing discomfort.  Please do follow-up with your primary care provider for reevaluation, and return to the emergency department with any new or worsening symptoms.

## 2024-07-21 ENCOUNTER — Observation Stay
Admission: EM | Admit: 2024-07-21 | Discharge: 2024-07-23 | Disposition: A | Attending: Emergency Medicine | Admitting: Emergency Medicine

## 2024-07-21 ENCOUNTER — Encounter: Payer: Self-pay | Admitting: Internal Medicine

## 2024-07-21 ENCOUNTER — Other Ambulatory Visit: Payer: Self-pay

## 2024-07-21 DIAGNOSIS — F17200 Nicotine dependence, unspecified, uncomplicated: Secondary | ICD-10-CM | POA: Diagnosis not present

## 2024-07-21 DIAGNOSIS — J42 Unspecified chronic bronchitis: Secondary | ICD-10-CM

## 2024-07-21 DIAGNOSIS — N179 Acute kidney failure, unspecified: Secondary | ICD-10-CM | POA: Diagnosis not present

## 2024-07-21 DIAGNOSIS — R569 Unspecified convulsions: Secondary | ICD-10-CM | POA: Diagnosis not present

## 2024-07-21 DIAGNOSIS — Z79899 Other long term (current) drug therapy: Secondary | ICD-10-CM | POA: Insufficient documentation

## 2024-07-21 DIAGNOSIS — G40909 Epilepsy, unspecified, not intractable, without status epilepticus: Principal | ICD-10-CM | POA: Insufficient documentation

## 2024-07-21 DIAGNOSIS — Z23 Encounter for immunization: Secondary | ICD-10-CM | POA: Diagnosis not present

## 2024-07-21 DIAGNOSIS — F1910 Other psychoactive substance abuse, uncomplicated: Secondary | ICD-10-CM | POA: Diagnosis present

## 2024-07-21 DIAGNOSIS — D72829 Elevated white blood cell count, unspecified: Secondary | ICD-10-CM | POA: Diagnosis present

## 2024-07-21 DIAGNOSIS — J449 Chronic obstructive pulmonary disease, unspecified: Secondary | ICD-10-CM | POA: Diagnosis not present

## 2024-07-21 DIAGNOSIS — F17210 Nicotine dependence, cigarettes, uncomplicated: Secondary | ICD-10-CM | POA: Diagnosis not present

## 2024-07-21 DIAGNOSIS — G894 Chronic pain syndrome: Secondary | ICD-10-CM | POA: Diagnosis present

## 2024-07-21 DIAGNOSIS — E872 Acidosis, unspecified: Secondary | ICD-10-CM | POA: Diagnosis not present

## 2024-07-21 DIAGNOSIS — I10 Essential (primary) hypertension: Secondary | ICD-10-CM | POA: Diagnosis not present

## 2024-07-21 DIAGNOSIS — E876 Hypokalemia: Secondary | ICD-10-CM | POA: Diagnosis present

## 2024-07-21 DIAGNOSIS — R112 Nausea with vomiting, unspecified: Secondary | ICD-10-CM | POA: Insufficient documentation

## 2024-07-21 DIAGNOSIS — R197 Diarrhea, unspecified: Secondary | ICD-10-CM | POA: Insufficient documentation

## 2024-07-21 LAB — CBC WITH DIFFERENTIAL/PLATELET
Abs Immature Granulocytes: 0.09 K/uL — ABNORMAL HIGH (ref 0.00–0.07)
Basophils Absolute: 0.1 K/uL (ref 0.0–0.1)
Basophils Relative: 0 %
Eosinophils Absolute: 0 K/uL (ref 0.0–0.5)
Eosinophils Relative: 0 %
HCT: 42.2 % (ref 36.0–46.0)
Hemoglobin: 13.7 g/dL (ref 12.0–15.0)
Immature Granulocytes: 1 %
Lymphocytes Relative: 7 %
Lymphs Abs: 1.2 K/uL (ref 0.7–4.0)
MCH: 30.2 pg (ref 26.0–34.0)
MCHC: 32.5 g/dL (ref 30.0–36.0)
MCV: 93 fL (ref 80.0–100.0)
Monocytes Absolute: 0.6 K/uL (ref 0.1–1.0)
Monocytes Relative: 4 %
Neutro Abs: 14.7 K/uL — ABNORMAL HIGH (ref 1.7–7.7)
Neutrophils Relative %: 88 %
Platelets: 504 K/uL — ABNORMAL HIGH (ref 150–400)
RBC: 4.54 MIL/uL (ref 3.87–5.11)
RDW: 13.5 % (ref 11.5–15.5)
WBC: 16.7 K/uL — ABNORMAL HIGH (ref 4.0–10.5)
nRBC: 0 % (ref 0.0–0.2)

## 2024-07-21 LAB — COMPREHENSIVE METABOLIC PANEL WITH GFR
ALT: 17 U/L (ref 0–44)
AST: 30 U/L (ref 15–41)
Albumin: 4.2 g/dL (ref 3.5–5.0)
Alkaline Phosphatase: 98 U/L (ref 38–126)
Anion gap: 24 — ABNORMAL HIGH (ref 5–15)
BUN: 20 mg/dL (ref 8–23)
CO2: 15 mmol/L — ABNORMAL LOW (ref 22–32)
Calcium: 9.5 mg/dL (ref 8.9–10.3)
Chloride: 100 mmol/L (ref 98–111)
Creatinine, Ser: 1.1 mg/dL — ABNORMAL HIGH (ref 0.44–1.00)
GFR, Estimated: 55 mL/min — ABNORMAL LOW
Glucose, Bld: 200 mg/dL — ABNORMAL HIGH (ref 70–99)
Potassium: 2.9 mmol/L — ABNORMAL LOW (ref 3.5–5.1)
Sodium: 138 mmol/L (ref 135–145)
Total Bilirubin: 0.3 mg/dL (ref 0.0–1.2)
Total Protein: 7.3 g/dL (ref 6.5–8.1)

## 2024-07-21 LAB — ACETAMINOPHEN LEVEL: Acetaminophen (Tylenol), Serum: <10 ug/mL — ABNORMAL LOW (ref 10–30)

## 2024-07-21 LAB — RESP PANEL BY RT-PCR (RSV, FLU A&B, COVID)  RVPGX2
Influenza A by PCR: NEGATIVE
Influenza B by PCR: NEGATIVE
Resp Syncytial Virus by PCR: NEGATIVE
SARS Coronavirus 2 by RT PCR: NEGATIVE

## 2024-07-21 LAB — LIPASE, BLOOD: Lipase: 15 U/L (ref 11–51)

## 2024-07-21 LAB — SALICYLATE LEVEL: Salicylate Lvl: <7 mg/dL — ABNORMAL LOW (ref 7.0–30.0)

## 2024-07-21 LAB — ETHANOL: Alcohol, Ethyl (B): <15 mg/dL

## 2024-07-21 MED ORDER — FAMOTIDINE 20 MG PO TABS
20.0000 mg | ORAL_TABLET | Freq: Every day | ORAL | Status: DC
  Administered 2024-07-22 – 2024-07-23 (×2): 20 mg via ORAL
  Filled 2024-07-21 (×2): qty 1

## 2024-07-21 MED ORDER — NICOTINE 21 MG/24HR TD PT24
21.0000 mg | MEDICATED_PATCH | Freq: Every day | TRANSDERMAL | Status: DC
  Administered 2024-07-21 – 2024-07-23 (×3): 21 mg via TRANSDERMAL
  Filled 2024-07-21 (×3): qty 1

## 2024-07-21 MED ORDER — DIAZEPAM 5 MG/ML IJ SOLN
10.0000 mg | Freq: Once | INTRAMUSCULAR | Status: AC
  Administered 2024-07-21: 10 mg via INTRAVENOUS
  Filled 2024-07-21: qty 2

## 2024-07-21 MED ORDER — ONDANSETRON HCL 4 MG/2ML IJ SOLN: 4.0000 mg | Freq: Three times a day (TID) | INTRAMUSCULAR | Status: DC | PRN

## 2024-07-21 MED ORDER — ALBUTEROL SULFATE (2.5 MG/3ML) 0.083% IN NEBU: 2.5000 mg | INHALATION_SOLUTION | RESPIRATORY_TRACT | Status: DC | PRN

## 2024-07-21 MED ORDER — HYDRALAZINE HCL 20 MG/ML IJ SOLN: 5.0000 mg | INTRAMUSCULAR | Status: DC | PRN

## 2024-07-21 MED ORDER — POTASSIUM CHLORIDE 10 MEQ/100ML IV SOLN
10.0000 meq | INTRAVENOUS | Status: AC
  Administered 2024-07-21 (×2): 10 meq via INTRAVENOUS
  Filled 2024-07-21 (×2): qty 100

## 2024-07-21 MED ORDER — ALBUTEROL SULFATE HFA 108 (90 BASE) MCG/ACT IN AERS: 2.0000 | INHALATION_SPRAY | RESPIRATORY_TRACT | Status: DC | PRN

## 2024-07-21 MED ORDER — LEVETIRACETAM (KEPPRA) 500 MG/5 ML ADULT IV PUSH
1000.0000 mg | INTRAVENOUS | Status: AC
  Administered 2024-07-21: 1000 mg via INTRAVENOUS
  Filled 2024-07-21: qty 10

## 2024-07-21 MED ORDER — ENOXAPARIN SODIUM 40 MG/0.4ML IJ SOSY
40.0000 mg | PREFILLED_SYRINGE | INTRAMUSCULAR | Status: DC
  Administered 2024-07-21 – 2024-07-22 (×2): 40 mg via SUBCUTANEOUS
  Filled 2024-07-21 (×2): qty 0.4

## 2024-07-21 MED ORDER — LEVETIRACETAM (KEPPRA) 500 MG/5 ML ADULT IV PUSH: 60.0000 mg/kg | INTRAVENOUS | Status: DC

## 2024-07-21 MED ORDER — ACETAMINOPHEN 325 MG PO TABS
650.0000 mg | ORAL_TABLET | Freq: Four times a day (QID) | ORAL | Status: DC | PRN
  Administered 2024-07-22: 650 mg via ORAL
  Filled 2024-07-21: qty 2

## 2024-07-21 MED ORDER — DM-GUAIFENESIN ER 30-600 MG PO TB12: 1.0000 | ORAL_TABLET | Freq: Two times a day (BID) | ORAL | Status: DC | PRN

## 2024-07-21 MED ORDER — SODIUM CHLORIDE 0.9 % IV BOLUS
1000.0000 mL | Freq: Once | INTRAVENOUS | Status: AC
  Administered 2024-07-21: 1000 mL via INTRAVENOUS

## 2024-07-21 MED ORDER — VITAMIN B-12 1000 MCG PO TABS
1000.0000 ug | ORAL_TABLET | Freq: Every day | ORAL | Status: DC
  Administered 2024-07-22 – 2024-07-23 (×2): 1000 ug via ORAL
  Filled 2024-07-21: qty 1
  Filled 2024-07-21: qty 2

## 2024-07-21 MED ORDER — GABAPENTIN 400 MG PO CAPS
800.0000 mg | ORAL_CAPSULE | Freq: Three times a day (TID) | ORAL | Status: DC
  Administered 2024-07-22 – 2024-07-23 (×4): 800 mg via ORAL
  Filled 2024-07-21: qty 2
  Filled 2024-07-21: qty 8
  Filled 2024-07-21 (×2): qty 2
  Filled 2024-07-21: qty 8
  Filled 2024-07-21 (×3): qty 2

## 2024-07-21 MED ORDER — OXYCODONE HCL 5 MG PO TABS: 5.0000 mg | ORAL_TABLET | Freq: Four times a day (QID) | ORAL | Status: DC | PRN

## 2024-07-21 MED ORDER — POTASSIUM CHLORIDE CRYS ER 20 MEQ PO TBCR
40.0000 meq | EXTENDED_RELEASE_TABLET | Freq: Once | ORAL | Status: AC
  Administered 2024-07-21: 40 meq via ORAL
  Filled 2024-07-21: qty 2

## 2024-07-21 MED ORDER — CYCLOBENZAPRINE HCL 10 MG PO TABS: 10.0000 mg | ORAL_TABLET | Freq: Three times a day (TID) | ORAL | Status: DC | PRN

## 2024-07-21 MED ORDER — ASPIRIN 81 MG PO TBEC
81.0000 mg | DELAYED_RELEASE_TABLET | Freq: Every day | ORAL | Status: DC
  Administered 2024-07-22 – 2024-07-23 (×2): 81 mg via ORAL
  Filled 2024-07-21 (×2): qty 1

## 2024-07-21 MED ORDER — LEVETIRACETAM 500 MG PO TABS
1000.0000 mg | ORAL_TABLET | Freq: Two times a day (BID) | ORAL | Status: DC
  Administered 2024-07-22 – 2024-07-23 (×3): 1000 mg via ORAL
  Filled 2024-07-21 (×3): qty 2

## 2024-07-21 MED ORDER — AMLODIPINE BESYLATE 5 MG PO TABS
5.0000 mg | ORAL_TABLET | Freq: Every day | ORAL | Status: DC
  Administered 2024-07-22 – 2024-07-23 (×2): 5 mg via ORAL
  Filled 2024-07-21 (×2): qty 1

## 2024-07-21 MED ORDER — DULOXETINE HCL 30 MG PO CPEP
60.0000 mg | ORAL_CAPSULE | Freq: Two times a day (BID) | ORAL | Status: DC
  Administered 2024-07-22 – 2024-07-23 (×3): 60 mg via ORAL
  Filled 2024-07-21 (×3): qty 2

## 2024-07-21 MED ORDER — LORAZEPAM 2 MG/ML IJ SOLN: 2.0000 mg | INTRAMUSCULAR | Status: DC | PRN

## 2024-07-21 MED ORDER — HYDROXYZINE HCL 25 MG PO TABS
25.0000 mg | ORAL_TABLET | Freq: Every day | ORAL | Status: DC
  Administered 2024-07-22 – 2024-07-23 (×2): 25 mg via ORAL
  Filled 2024-07-21 (×2): qty 1

## 2024-07-21 NOTE — H&P (Signed)
 " History and Physical    Alison Walker FMW:[id number] DOB: 01-04-1961 DOA: 07/21/2024  Referring MD/NP/PA:   PCP: [facility]   Patient coming from:  The patient is coming from home.     Chief Complaint: seizure  HPI: Alison Walker is a 64 y.o. female with medical history significant of seizure, HTN, COPD, GERD, polysubstance abuse, vitamin B12 deficiency, neuropathy, diverticulitis, WPW (s/p of ablation), chronic pain, depression with anxiety, GERD, who presents with seizure.  Per her fianc at bedside, patient had 3 episode of seizure at home around 2:30 PM, each episode lasted for about 1 minutes.  Patient had another seizure when EMS arrived, lasted for 2.5 minutes. Pt was then in postictal status.  1 Mg versed  was given by EMS.  Patient received Valium  10 mg x 2, and loaded with 1 g of Keppra  in ED.  Her mental status has gradually returned to the baseline in ED.  Currently patient is oriented x 3.  Little bit of lethargic.  Per her fianc, patient has been taking 1000 mg of Keppra  bid, but she missed the dose this morning.  Patient does not have chest pain, cough, SOB.  Patient had nausea and vomited twice today, no abdominal pain.  She also reports intermittent diarrhea, and has had 1 diarrhea today.  No fever or chills.  No symptoms of UTI. Per her fianc, patient did not have fall or any head injury.  Data reviewed independently and ED Course: pt was found to have WBC 16.7, potassium 2.9, mild AKI with creatinine 1.10, BUN 20, GFR 55 (recent baseline creatinine 0.92 with GFR> 60 on 07/03/2024), bicarbonate 15, negative PCR for COVID, flu and RSV, Tylenol  level less than 15, salicylate level less than 7, alcohol level less than 15.  Temperature 99.1, blood pressure 149/81, heart rate of 111, RR 26, oxygen saturation 99% on room air.  Patient is placed in PCU for observation.   EKG:  Not done in ED, will get one.     Review of Systems:   General: no fevers, chills,  no body weight gain, has poor appetite, has fatigue HEENT: no blurry vision, hearing changes or sore throat Respiratory: no dyspnea, coughing, wheezing CV: no chest pain, no palpitations GI: has nausea, vomiting, diarrhea, no constipation, abdominal pain GU: no dysuria, burning on urination, increased urinary frequency, hematuria  Ext: no leg edema Neuro: no unilateral weakness, numbness, or tingling, no vision change or hearing loss. Has seizure Skin: no rash, no skin tear. MSK: No muscle spasm, no deformity, no limitation of range of movement in spin Heme: No easy bruising.  Travel history: No recent long distant travel.   Allergy: Allergies[1]  Past Medical History:  Diagnosis Date   Diverticulitis    Hypertension    S/P ablation operation for arrhythmia    for WPW   Wolf-Parkinson-White syndrome     Past Surgical History:  Procedure Laterality Date   ABDOMINAL SURGERY     CHOLECYSTECTOMY     COLONOSCOPY     COLOSTOMY     COLOSTOMY REVISION      Social History:  reports that she has been smoking cigarettes. She has never used smokeless tobacco. She reports that she does not drink alcohol and does not use drugs.  Family History:  Family History  Problem Relation Age of Onset   Cancer Mother    Cancer Other      Prior to Admission medications  Medication Sig Start Date End Date  Taking? Authorizing Provider  acetaminophen  (TYLENOL ) 500 MG tablet Take 500 mg by mouth every 6 (six) hours as needed.    [provider]  acetaminophen  (TYLENOL ) 500 MG tablet Take 2 tablets (1,000 mg total) by mouth every 6 (six) hours as needed. 07/03/24 07/03/25  Clarine Ozell LABOR, MD  albuterol  (VENTOLIN  HFA) 108 (90 Base) MCG/ACT inhaler Inhale 1-2 puffs into the lungs every 4 (four) hours as needed.    [provider]  aluminum -magnesium  hydroxide-simethicone  (MAALOX) 200-200-20 MG/5ML SUSP Take 30 mLs by mouth 4 (four) times daily -  before meals and at bedtime.  07/03/24   Clarine Ozell LABOR, MD  amLODipine  (NORVASC ) 5 MG tablet Take 5 mg by mouth daily.    [provider]  aspirin  EC 81 MG tablet Take 81 mg by mouth daily.    [provider]  clotrimazole -betamethasone  (LOTRISONE ) cream Apply 1 Application topically daily. 11/30/23   Janit Thresa HERO, DPM  cyanocobalamin  1000 MCG tablet Take 1 tablet (1,000 mcg total) by mouth daily. 12/19/22   Josette Ade, MD  cyclobenzaprine  (FLEXERIL ) 10 MG tablet Take 1 tablet (10 mg total) by mouth 3 (three) times daily as needed for muscle spasms. 02/11/24   Janit Thresa HERO, DPM  diclofenac Sodium (VOLTAREN) 1 % GEL Apply 4 g topically 4 (four) times daily.    [provider]  DULoxetine  HCl 60 MG CSDR Take 60 mg by mouth daily.    [provider]  famotidine  (PEPCID ) 20 MG tablet Take 1 tablet (20 mg total) by mouth daily. 12/19/22   Josette Ade, MD  feeding supplement (ENSURE ENLIVE / ENSURE PLUS) LIQD Take 237 mLs by mouth 2 (two) times daily between meals. 12/19/22   Josette Ade, MD  gabapentin  (NEURONTIN ) 800 MG tablet Take 800 mg by mouth 3 (three) times daily.    [provider]  HYDROcodone -acetaminophen  (NORCO/VICODIN) 5-325 MG tablet Take 1 tablet by mouth every 4 (four) hours as needed. 06/05/24   Dorothyann Drivers, MD  ibuprofen  (ADVIL ) 800 MG tablet Take 1 tablet (800 mg total) by mouth 3 (three) times daily. 03/02/24   Janit Thresa HERO, DPM  levETIRAcetam  (KEPPRA ) 1000 MG tablet Take 1 tablet (1,000 mg total) by mouth 2 (two) times daily. 12/19/22   Josette Ade, MD  lidocaine  (LIDODERM ) 5 % Place 1 patch onto the skin daily. 12/15/22   [provider]  NARCAN  4 MG/0.1ML LIQD nasal spray kit Place 1 spray into the nose once. 08/12/22   [provider]  oxyCODONE -acetaminophen  (PERCOCET) 5-325 MG tablet Take 1 tablet by mouth every 4 (four) hours as needed for severe pain (pain score 7-10). 03/02/24   Janit Thresa HERO, DPM  polyethylene glycol  (MIRALAX  / GLYCOLAX ) 17 g packet Place 17 g into feeding tube daily as needed for moderate constipation. 12/19/22   Josette Ade, MD  potassium chloride  SA (KLOR-CON  M) 20 MEQ tablet Take 1 tablet (20 mEq total) by mouth daily for 3 days. 12/19/22 02/22/24  Josette Ade, MD  tiotropium (SPIRIVA ) 18 MCG inhalation capsule Place 18 mcg into inhaler and inhale daily.    [provider]    Physical Exam: Vitals:   07/21/24 2100 07/21/24 2130 07/21/24 2200 07/21/24 2330  BP: (!) 121/90 (!) 154/89 (!) 162/86 132/77  Pulse: (!) 105 89 92 (!) 104  Resp: (!) 22 14 17 13   Temp:  99.1 F (37.3 C)    TempSrc:  Oral    SpO2: 96% 99% 99% 99%  Weight:  Height:       General: Not in acute distress.  Dry mucous membrane HEENT:       Eyes: PERRL, EOMI, no jaundice       ENT: No discharge from the ears and nose, no pharynx injection, no tonsillar enlargement.        Neck: No JVD, no bruit, no mass felt. Heme: No neck lymph node enlargement. Cardiac: S1/S2, RRR, No murmurs, No gallops or rubs. Respiratory: No rales, wheezing, rhonchi or rubs. GI: Soft, nondistended, nontender, no rebound pain, no organomegaly, BS present. GU: No hematuria Ext: No pitting leg edema bilaterally. 1+DP/PT pulse bilaterally. Musculoskeletal: No joint deformities, No joint redness or warmth, no limitation of ROM in spin. Skin: No rashes.  Neuro: Lethargic, but oriented X3, cranial nerves II-XII grossly intact, moves all extremities normally.  Psych: Patient is not psychotic, no suicidal or hemocidal ideation.  Labs on Admission: I have personally reviewed following labs and imaging studies  CBC: Recent Labs  Lab 07/21/24 1708  WBC 16.7*  NEUTROABS 14.7*  HGB 13.7  HCT 42.2  MCV 93.0  PLT 504*   Basic Metabolic Panel: Recent Labs  Lab 07/21/24 1708  NA 138  K 2.9*  CL 100  CO2 15*  GLUCOSE 200*  BUN 20  CREATININE 1.10*  CALCIUM 9.5   GFR: Estimated Creatinine Clearance: 44.2 mL/min  (A) (by C-G formula based on SCr of 1.1 mg/dL (H)). Liver Function Tests: Recent Labs  Lab 07/21/24 1708  AST 30  ALT 17  ALKPHOS 98  BILITOT 0.3  PROT 7.3  ALBUMIN 4.2   Recent Labs  Lab 07/21/24 1708  LIPASE 15   No results for input(s): AMMONIA in the last 168 hours. Coagulation Profile: No results for input(s): INR, PROTIME in the last 168 hours. Cardiac Enzymes: No results for input(s): CKTOTAL, CKMB, CKMBINDEX, TROPONINI in the last 168 hours. BNP (last 3 results) No results for input(s): PROBNP in the last 8760 hours. HbA1C: No results for input(s): HGBA1C in the last 72 hours. CBG: No results for input(s): GLUCAP in the last 168 hours. Lipid Profile: No results for input(s): CHOL, HDL, LDLCALC, TRIG, CHOLHDL, LDLDIRECT in the last 72 hours. Thyroid Function Tests: No results for input(s): TSH, T4TOTAL, FREET4, T3FREE, THYROIDAB in the last 72 hours. Anemia Panel: No results for input(s): VITAMINB12, FOLATE, FERRITIN, TIBC, IRON, RETICCTPCT in the last 72 hours. Urine analysis:    Component Value Date/Time   COLORURINE YELLOW (A) 04/14/2023 1624   APPEARANCEUR CLEAR (A) 04/14/2023 1624   APPEARANCEUR Clear 04/13/2014 0330   LABSPEC 1.023 04/14/2023 1624   LABSPEC 1.004 04/13/2014 0330   PHURINE 6.0 04/14/2023 1624   GLUCOSEU NEGATIVE 04/14/2023 1624   GLUCOSEU Negative 04/13/2014 0330   HGBUR NEGATIVE 04/14/2023 1624   BILIRUBINUR NEGATIVE 04/14/2023 1624   BILIRUBINUR Negative 04/13/2014 0330   KETONESUR NEGATIVE 04/14/2023 1624   PROTEINUR NEGATIVE 04/14/2023 1624   UROBILINOGEN 1.0 03/12/2011 0912   NITRITE NEGATIVE 04/14/2023 1624   LEUKOCYTESUR TRACE (A) 04/14/2023 1624   LEUKOCYTESUR Negative 04/13/2014 0330   Sepsis Labs: @LABRCNTIP (procalcitonin:4,lacticidven:4) ) Recent Results (from the past 240 hours)  Resp panel by RT-PCR (RSV, Flu A&B, Covid) Anterior Nasal Swab     Status: None    Collection Time: 07/21/24  5:08 PM   Specimen: Anterior Nasal Swab  Result Value Ref Range Status   SARS Coronavirus 2 by RT PCR NEGATIVE NEGATIVE Final    Comment: (NOTE) SARS-CoV-2 target nucleic acids are NOT DETECTED.  The SARS-CoV-2 RNA is generally detectable in upper respiratory specimens during the acute phase of infection. The lowest concentration of SARS-CoV-2 viral copies this assay can detect is 138 copies/mL. A negative result does not preclude SARS-Cov-2 infection and should not be used as the sole basis for treatment or other patient management decisions. A negative result may occur with  improper specimen collection/handling, submission of specimen other than nasopharyngeal swab, presence of viral mutation(s) within the areas targeted by this assay, and inadequate number of viral copies(<138 copies/mL). A negative result must be combined with clinical observations, patient history, and epidemiological information. The expected result is Negative.  Fact Sheet for Patients:  bloggercourse.com  Fact Sheet for Healthcare Providers:  seriousbroker.it  This test is no t yet approved or cleared by the [place]  FDA and  has been authorized for detection and/or diagnosis of SARS-CoV-2 by FDA under an Emergency Use Authorization (EUA). This EUA will remain  in effect (meaning this test can be used) for the duration of the COVID-19 declaration under Section 564(b)(1) of the Act, 21 U.S.C.section 360bbb-3(b)(1), unless the authorization is terminated  or revoked sooner.       Influenza A by PCR NEGATIVE NEGATIVE Final   Influenza B by PCR NEGATIVE NEGATIVE Final    Comment: (NOTE) The Xpert Xpress SARS-CoV-2/FLU/RSV plus assay is intended as an aid in the diagnosis of influenza from Nasopharyngeal swab specimens and should not be used as a sole basis for treatment. Nasal washings and aspirates are unacceptable for  Xpert Xpress SARS-CoV-2/FLU/RSV testing.  Fact Sheet for Patients: bloggercourse.com  Fact Sheet for Healthcare Providers: seriousbroker.it  This test is not yet approved or cleared by the [place]  FDA and has been authorized for detection and/or diagnosis of SARS-CoV-2 by FDA under an Emergency Use Authorization (EUA). This EUA will remain in effect (meaning this test can be used) for the duration of the COVID-19 declaration under Section 564(b)(1) of the Act, 21 U.S.C. section 360bbb-3(b)(1), unless the authorization is terminated or revoked.     Resp Syncytial Virus by PCR NEGATIVE NEGATIVE Final    Comment: (NOTE) Fact Sheet for Patients: bloggercourse.com  Fact Sheet for Healthcare Providers: seriousbroker.it  This test is not yet approved or cleared by the [place]  FDA and has been authorized for detection and/or diagnosis of SARS-CoV-2 by FDA under an Emergency Use Authorization (EUA). This EUA will remain in effect (meaning this test can be used) for the duration of the COVID-19 declaration under Section 564(b)(1) of the Act, 21 U.S.C. section 360bbb-3(b)(1), unless the authorization is terminated or revoked.  Performed at [facility], [address] [place], [place] [address]      Radiological Exams on Admission:   Assessment/Plan Principal Problem:   Seizure [facility]) Active Problems:   Essential hypertension   Hypokalemia   COPD (chronic obstructive pulmonary disease) (HCC)   Polysubstance abuse (HCC)   AKI (acute kidney injury)   Metabolic acidosis   Leukocytosis   Chronic pain syndrome   Tobacco use disorder   Nausea vomiting and diarrhea   Assessment and Plan:   Seizure [facility]): Etiology is not clear.  May be due to incomplete compliance to taking Keppra  or incomplete absorption due to nausea vomiting. Patient missed Keppra  dose  this morning.  -Place in PCU for observation - IV Keppra  1 g, then continue home oral Keppra  1 g twice daily - EEG - Fall precaution and seizure precaution - Frequent neurocheck - As needed Ativan  for seizure -  Follow-up Keppra  level  Essential hypertension -IV hydralazine  as needed - Amlodipine   Hypokalemia: Potassium 2.9 -Repleted potassium level - Check phosphorus level - Check magnesium  level  COPD (chronic obstructive pulmonary disease) (HCC): Stable -Bronchodilators as needed Mucinex   Polysubstance abuse (HCC) -Check UDS  AKI (acute kidney injury): Mild.  Likely due to dehydration. -2 L normal saline bolus  Metabolic acidosis: Bicarbonate 15, might be due to lactic acidosis secondary to seizure. -Lactic acid level  Leukocytosis: WBC 16.7, no fever, no source of infection identified.  Likely reactive.  Since patient has nausea vomiting and diarrhea.  Will check C. difficile. - Follow-up CBC  Chronic pain syndrome -Continue home oxycodone  - As needed Tylenol   Tobacco use disorder -Continue routine patch  Nausea vomiting and diarrhea - Check C. difficile and GI pathogen panel - IV fluids above      DVT ppx:  SQ Lovenox   Code Status: Full code    Family Communication:    Yes, patient's fianc   at bed side.     Disposition Plan:  Anticipate discharge back to previous environment  Consults called:  none  Admission status and Level of care: Progressive:    for obs     Dispo: The patient is from: Home              Anticipated d/c is to: Home              Anticipated d/c date is: 1 day              Patient currently is not medically stable to d/c.    Severity of Illness:  The appropriate patient status for this patient is OBSERVATION. Observation status is judged to be reasonable and necessary in order to provide the required intensity of service to ensure the patient's safety. The patient's presenting symptoms, physical exam findings, and initial  radiographic and laboratory data in the context of their medical condition is felt to place them at decreased risk for further clinical deterioration. Furthermore, it is anticipated that the patient will be medically stable for discharge from the hospital within 2 midnights of admission.        Date of Service 07/22/2024    Caleb Exon [facility]   If 7PM-7AM, please contact night-coverage [email or web address] 07/22/2024, 12:40 AM     [1]  Allergies Allergen Reactions   Ciprofloxacin Rash   Morphine  Rash   "

## 2024-07-21 NOTE — ED Triage Notes (Signed)
 Pt to ED via [facility] from home due to having 3 seizures today. Each seizure lasted less than 1 min. AxO4 at [facility] Pt had seizure with EMS lasting 2.5 min. 1 Mg versed  given by EMS . Pt Postictal during Triage. Last seizure was 3 months ago with no changes to medications. Unsure if pt has missed any doses of keppra .     202 cbg 112 HR 127/62 94% Ra

## 2024-07-21 NOTE — ED Provider Notes (Signed)
 "  [facility] Provider Note    Event Date/Time   First MD Initiated Contact with Patient 07/21/24 1648     (approximate)   History   Chief Complaint: Seizures   HPI  Alison Walker is a 64 y.o. female with hypertension, seizure disorder on Keppra , history of polysubstance abuse who comes to the ED due to multiple seizures today.  After 3 seizures at home, EMS arrived and found the patient postictal.  During transport to the ED she had another seizure in their presence, lasting 2.5 minutes, she was given IV Versed .  On arrival patient is stuporous, maintaining her airway        Past Medical History:  Diagnosis Date   Diverticulitis    Hypertension    S/P ablation operation for arrhythmia    for WPW   Wolf-Parkinson-White syndrome     Current Outpatient Rx   Order #: [id number] Class: Historical Med   Order #: [id number] Class: Normal   Order #: [id number] Class: Historical Med   Order #: [id number] Class: Normal   Order #: [id number] Class: Historical Med   Order #: [id number] Class: Historical Med   Order #: [id number] Class: Normal   Order #: [id number] Class: Normal   Order #: [id number] Class: Normal   Order #: [id number] Class: Historical Med   Order #: [id number] Class: Historical Med   Order #: [id number] Class: Normal   Order #: [id number] Class: Normal   Order #: [id number] Class: Historical Med   Order #: [id number] Class: Normal   Order #: [id number] Class: Normal   Order #: [id number] Class: Normal   Order #: [id number] Class: Historical Med   Order #: [id number] Class: Historical Med   Order #: [id number] Class: Normal   Order #: [id number] Class: Normal   Order #: [id number] Class: Normal   Order #: [id number] Class: Historical Med    Past Surgical History:  Procedure Laterality Date   ABDOMINAL SURGERY     CHOLECYSTECTOMY     COLONOSCOPY     COLOSTOMY     COLOSTOMY REVISION      Physical Exam   Triage Vital Signs: ED Triage Vitals  Encounter Vitals Group      BP --      Girls Systolic BP Percentile --      Girls Diastolic BP Percentile --      Boys Systolic BP Percentile --      Boys Diastolic BP Percentile --      Pulse --      Resp --      Temp --      Temp src --      SpO2 07/21/24 1649 98 %     Weight 07/21/24 1651 136 lb 11 oz (62 kg)     Height 07/21/24 1651 5' 2 (1.575 m)     Head Circumference --      Peak Flow --      Pain Score --      Pain Loc --      Pain Education --      Exclude from Growth Chart --     Most recent vital signs: Vitals:   07/21/24 1915 07/21/24 1930  BP: (!) 149/81   Pulse: (!) 113 (!) 111  Resp: (!) 26 16  SpO2: 99% 100%    General: stuporous, no distress.  CV:  Good peripheral perfusion.  Regular rate rhythm Resp:  Normal effort.  Clear lungs Abd:  No distention.  Soft nontender Other:  Full range of motion all extremities.  Head atraumatic.  Moist oral mucosa   ED  Results / Procedures / Treatments   Labs (all labs ordered are listed, but only abnormal results are displayed) Labs Reviewed  ACETAMINOPHEN  LEVEL - Abnormal; Notable for the following components:      Result Value   Acetaminophen  (Tylenol ), Serum <10 (*)    All other components within normal limits  COMPREHENSIVE METABOLIC PANEL WITH GFR - Abnormal; Notable for the following components:   Potassium 2.9 (*)    CO2 15 (*)    Glucose, Bld 200 (*)    Creatinine, Ser 1.10 (*)    GFR, Estimated 55 (*)    Anion gap 24 (*)    All other components within normal limits  SALICYLATE LEVEL - Abnormal; Notable for the following components:   Salicylate Lvl <7.0 (*)    All other components within normal limits  CBC WITH DIFFERENTIAL/PLATELET - Abnormal; Notable for the following components:   WBC 16.7 (*)    Platelets 504 (*)    Neutro Abs 14.7 (*)    Abs Immature Granulocytes 0.09 (*)    All other components within normal limits  RESP PANEL BY RT-PCR (RSV, FLU A&B, COVID)  [id number]  ETHANOL  LIPASE, BLOOD  LEVETIRACETAM   LEVEL  URINALYSIS, W/ REFLEX TO CULTURE (INFECTION SUSPECTED)  URINE DRUG SCREEN  MAGNESIUM   PHOSPHORUS  LACTIC ACID, PLASMA     EKG    RADIOLOGY    PROCEDURES:  .Critical Care  Performed by: Viviann Pastor, MD Authorized by: Viviann Pastor, MD   Critical care provider statement:    Critical care time (minutes):  35   Critical care time was exclusive of:  Separately billable procedures and treating other patients   Critical care was necessary to treat or prevent imminent or life-threatening deterioration of the following conditions:  CNS failure or compromise   Critical care was time spent personally by me on the following activities:  Development of treatment plan with patient or surrogate, discussions with consultants, evaluation of patient's response to treatment, examination of patient, obtaining history from patient or surrogate, ordering and performing treatments and interventions, ordering and review of laboratory studies, ordering and review of radiographic studies, pulse oximetry, re-evaluation of patient's condition and review of old charts   Care discussed with: admitting provider      MEDICATIONS ORDERED IN ED: Medications  potassium chloride  10 mEq in 100 mL IVPB (10 mEq Intravenous New Bag/Given 07/21/24 1952)  levETIRAcetam  (KEPPRA ) undiluted injection 1,000 mg (has no administration in time range)  potassium chloride  SA (KLOR-CON  M) CR tablet 40 mEq (has no administration in time range)  gabapentin  (NEURONTIN ) capsule 800 mg (has no administration in time range)  LORazepam  (ATIVAN ) injection 2 mg (has no administration in time range)  dextromethorphan-guaiFENesin  (MUCINEX  DM) 30-600 MG per 12 hr tablet 1 tablet (has no administration in time range)  ondansetron  (ZOFRAN ) injection 4 mg (has no administration in time range)  hydrALAZINE  (APRESOLINE ) injection 5 mg (has no administration in time range)  acetaminophen  (TYLENOL ) tablet 650 mg (has no  administration in time range)  nicotine  (NICODERM CQ  - dosed in mg/24 hours) patch 21 mg (has no administration in time range)  albuterol  (PROVENTIL ) (2.5 MG/3ML) 0.083% nebulizer solution 2.5 mg (has no administration in time range)  sodium chloride  0.9 % bolus 1,000 mL (1,000 mLs Intravenous New Bag/Given 07/21/24 1709)  diazepam  (VALIUM ) injection 10 mg (10 mg Intravenous Given 07/21/24 1706)  levETIRAcetam  (KEPPRA ) undiluted injection 1,000 mg (1,000 mg Intravenous Given 07/21/24 1930)  diazepam  (VALIUM ) injection 10 mg (10 mg  Intravenous Given 07/21/24 1927)     IMPRESSION / MDM / ASSESSMENT AND PLAN / ED COURSE  I reviewed the triage vital signs and the nursing notes.  DDx: Decompensated seizure disorder, electrolyte derangement, anemia, COVID, influenza, UTI, substance abuse  Patient's presentation is most consistent with acute presentation with potential threat to life or bodily function.  Patient presents with multiple seizures having 4 episodes this afternoon prior to arrival in the ED.  Clearly postictal on arrival to the ED.  No signs of trauma.  Will give Valium , Keppra  load, will need to hospitalize for further stabilization.   Clinical Course as of 07/21/24 2014  [place] Jul 21, 2024  1722 Patient awake and oriented. [PS]  1850 While discussing results with pt and family member, she had another seizure with gaze to the right and tonus and apnea lasting about 30 seconds.  [PS]  1951 Pt awake and oriented again. [PS]  2014 Case d/w hospitalist. [PS]    Clinical Course User Index [PS] Viviann Pastor, MD     FINAL CLINICAL IMPRESSION(S) / ED DIAGNOSES   Final diagnoses:  Seizures (HCC)     Rx / DC Orders   ED Discharge Orders     None        Note:  This document was prepared using Dragon voice recognition software and may include unintentional dictation errors.   Viviann Pastor, MD 07/21/24 2014  "

## 2024-07-22 DIAGNOSIS — N179 Acute kidney failure, unspecified: Secondary | ICD-10-CM | POA: Diagnosis not present

## 2024-07-22 DIAGNOSIS — T426X6A Underdosing of other antiepileptic and sedative-hypnotic drugs, initial encounter: Secondary | ICD-10-CM

## 2024-07-22 DIAGNOSIS — R569 Unspecified convulsions: Secondary | ICD-10-CM | POA: Diagnosis not present

## 2024-07-22 DIAGNOSIS — F1490 Cocaine use, unspecified, uncomplicated: Secondary | ICD-10-CM | POA: Diagnosis not present

## 2024-07-22 DIAGNOSIS — R197 Diarrhea, unspecified: Secondary | ICD-10-CM | POA: Diagnosis not present

## 2024-07-22 DIAGNOSIS — R112 Nausea with vomiting, unspecified: Secondary | ICD-10-CM

## 2024-07-22 DIAGNOSIS — F1910 Other psychoactive substance abuse, uncomplicated: Secondary | ICD-10-CM | POA: Diagnosis not present

## 2024-07-22 DIAGNOSIS — E876 Hypokalemia: Secondary | ICD-10-CM | POA: Diagnosis not present

## 2024-07-22 LAB — BASIC METABOLIC PANEL WITH GFR
Anion gap: 10 (ref 5–15)
BUN: 13 mg/dL (ref 8–23)
CO2: 22 mmol/L (ref 22–32)
Calcium: 8.1 mg/dL — ABNORMAL LOW (ref 8.9–10.3)
Chloride: 109 mmol/L (ref 98–111)
Creatinine, Ser: 0.75 mg/dL (ref 0.44–1.00)
GFR, Estimated: >60 mL/min
Glucose, Bld: 106 mg/dL — ABNORMAL HIGH (ref 70–99)
Potassium: 3.8 mmol/L (ref 3.5–5.1)
Sodium: 141 mmol/L (ref 135–145)

## 2024-07-22 LAB — GASTROINTESTINAL PANEL BY PCR, STOOL (REPLACES STOOL CULTURE)

## 2024-07-22 LAB — C DIFFICILE QUICK SCREEN W PCR REFLEX
C Diff antigen: NEGATIVE
C Diff interpretation: NOT DETECTED
C Diff toxin: NEGATIVE

## 2024-07-22 LAB — CBC
HCT: 35.4 % — ABNORMAL LOW (ref 36.0–46.0)
Hemoglobin: 11.9 g/dL — ABNORMAL LOW (ref 12.0–15.0)
MCH: 30.6 pg (ref 26.0–34.0)
MCHC: 33.6 g/dL (ref 30.0–36.0)
MCV: 91 fL (ref 80.0–100.0)
Platelets: 364 K/uL (ref 150–400)
RBC: 3.89 MIL/uL (ref 3.87–5.11)
RDW: 14 % (ref 11.5–15.5)
WBC: 12.8 K/uL — ABNORMAL HIGH (ref 4.0–10.5)
nRBC: 0 % (ref 0.0–0.2)

## 2024-07-22 LAB — URINALYSIS, W/ REFLEX TO CULTURE (INFECTION SUSPECTED)
Bilirubin Urine: NEGATIVE
Glucose, UA: NEGATIVE mg/dL
Hgb urine dipstick: NEGATIVE
Ketones, ur: NEGATIVE mg/dL
Leukocytes,Ua: NEGATIVE
Nitrite: NEGATIVE
Protein, ur: NEGATIVE mg/dL
Specific Gravity, Urine: 1.03 (ref 1.005–1.030)
pH: 6 (ref 5.0–8.0)

## 2024-07-22 LAB — URINE DRUG SCREEN
Amphetamines: NEGATIVE
Barbiturates: NEGATIVE
Benzodiazepines: POSITIVE — AB
Cocaine: POSITIVE — AB
Fentanyl: POSITIVE — AB
Methadone Scn, Ur: NEGATIVE
Opiates: NEGATIVE
Tetrahydrocannabinol: POSITIVE — AB

## 2024-07-22 LAB — MAGNESIUM: Magnesium: 1.9 mg/dL (ref 1.7–2.4)

## 2024-07-22 LAB — LACTIC ACID, PLASMA: Lactic Acid, Venous: 1.2 mmol/L (ref 0.5–1.9)

## 2024-07-22 LAB — PHOSPHORUS: Phosphorus: 2.5 mg/dL (ref 2.5–4.6)

## 2024-07-22 LAB — HIV ANTIBODY (ROUTINE TESTING W REFLEX): HIV Screen 4th Generation wRfx: NONREACTIVE

## 2024-07-22 MED ORDER — SODIUM CHLORIDE 0.9 % IV BOLUS
1000.0000 mL | Freq: Once | INTRAVENOUS | Status: AC
  Administered 2024-07-22: 1000 mL via INTRAVENOUS

## 2024-07-22 NOTE — ED Notes (Signed)
 This RN and Production Assistant, Radio provided pericare after pt had a episode of urine incontinence. New brief applied. Pt repositioned and tolerated well. CB within reach.

## 2024-07-22 NOTE — ED Notes (Signed)
 This tech cleaned pt up after urinary incontinence. Warm bath wipes were used, new chux/brief/linen was placed on bed. Pt readjusted in bed. No other needs verbalized at this time.

## 2024-07-22 NOTE — Consult Note (Signed)
 NEUROLOGY CONSULT NOTE   Date of service: July 22, 2024 Patient Name: Alison Walker MRN:  [id number] DOB:  1961-04-13 Chief Complaint: Multiple breakthrough seizures Requesting Provider: Laurita Pillion, MD  History of Present Illness  Alison Walker is a 64 y.o. female with a PMHx of seizures (on Keppra  1000 mg BID), polysubstance abuse, diverticulitis, B12 deficiency, neuropathy, chronic pain, depression, anxiety, WPW syndrome s/p ablation and HTN who presented from home to the ED via EMS on Friday afternoon ED after having 3 seizures at home, each lasing less than 1 minute. On EMS arrival, she was postictal. En route to the ED, she had another seizure, lasting for 2.5 minutes, for which she was given 1 mg IV Versed . She was stuporous on arrival to the ED, but maintaining her airway. She received Valium  10 mg x 2, and was loaded with 1000 mg of Keppra  in the ED.    About 2 hours after arrival to the ED, she had a 5th seizure, manifesting with rightward gaze, clonus and apnea lasting for about 30 seconds. She was postictal afterwards, but became awake and oriented within about 1 hour post-seizure.    Her last seizure was 3 months ago. Per her fianc, the patient has been compliant with her Keppra , but she missed her dose in the morning on Friday.   UDS came back positive for cocaine and THC.     ROS  As per HPI.  Past History   Past Medical History:  Diagnosis Date   Diverticulitis    Hypertension    S/P ablation operation for arrhythmia    for WPW   Wolf-Parkinson-White syndrome     Past Surgical History:  Procedure Laterality Date   ABDOMINAL SURGERY     CHOLECYSTECTOMY     COLONOSCOPY     COLOSTOMY     COLOSTOMY REVISION      Family History: Family History  Problem Relation Age of Onset   Cancer Mother    Cancer Other     Social History  reports that she has been smoking cigarettes. She has never used smokeless tobacco. She reports that she does not drink alcohol  and does not use drugs.  Allergies[1]  Medications  Current Medications[2]  Vitals   Vitals:   07/22/24 0530 07/22/24 0600 07/22/24 0630 07/22/24 0800  BP: 122/79 (!) 141/63 131/65   Pulse: 95 89 88   Resp: 15 (!) 24 20   Temp:    98.7 F (37.1 C)  TempSrc:    Oral  SpO2: 99% 100% 100%   Weight:      Height:        Body mass index is 25 kg/m.   Physical Exam   Constitutional: Appears well-developed and well-nourished.  Psych: Somewhat subdued affect  Eyes: No scleral injection.  HENT: No OP obstruction.  Head: Normocephalic.  Respiratory: Effort normal, non-labored breathing.  Skin: WDI.   Neurologic Examination   Mental Status: Awake and alert. Partially oriented - can give the correct city and state, but not the year, month or day. Speech is sparse but fluent with intact naming for basic items. Able to follow all commands. Poor insight. No agitation noted.  Cranial Nerves: II: Temporal visual fields intact with no extinction to DSS. PERRL. III,IV, VI: No ptosis. EOMI. No nystagmus. V: Temp sensation equal bilaterally VII: Smile symmetric VIII: Hearing intact to voice IX,X: No hypophonia or hoarseness XI: Symmetric XII: Midline tongue extension Motor: RUE: 5/5 LUE: 5/5 RLE: 5/5 LLE:  5/5 No jerking, twitching or other seizure-like activity seen.  Sensory: FT intact x 4.  Deep Tendon Reflexes: 2+ and symmetric bilateral biceps, brachioradialis and patellae Plantars: Right: downgoingLeft: downgoing Cerebellar: No ataxia with FNF bilaterally Gait: Deferred   Labs/Imaging/Neurodiagnostic studies   CBC:  Recent Labs  Lab 08/07/24 1708 07/22/24 0500  WBC 16.7* 12.8*  NEUTROABS 14.7*  --   HGB 13.7 11.9*  HCT 42.2 35.4*  MCV 93.0 91.0  PLT 504* 364   Basic Metabolic Panel:  Lab Results  Component Value Date   NA 141 07/22/2024   K 3.8 07/22/2024   CO2 22 07/22/2024   GLUCOSE 106 (H) 07/22/2024   BUN 13 07/22/2024   CREATININE 0.75 07/22/2024    CALCIUM 8.1 (L) 07/22/2024   GFRNONAA >60 07/22/2024   GFRAA >60 02/27/2020   HgbA1c: No results found for: HGBA1C Urine Drug Screen:     Component Value Date/Time   LABOPIA NEGATIVE 07/22/2024 0543   COCAINSCRNUR POSITIVE (A) 07/22/2024 0543   COCAINSCRNUR POSITIVE (A) 12/17/2022 0359   LABBENZ POSITIVE (A) 07/22/2024 0543   AMPHETMU NEGATIVE 07/22/2024 0543   THCU POSITIVE (A) 07/22/2024 0543   LABBARB NEGATIVE 07/22/2024 0543    Alcohol Level     Component Value Date/Time   ETH <15 08/07/2024 1708   INR  Lab Results  Component Value Date   INR 1.1 02/28/2020   APTT  Lab Results  Component Value Date   APTT 38 (H) 02/28/2020     ASSESSMENT  Alison Walker is a 64 y.o. female with a PMHx of seizures (on Keppra  1000 mg BID), polysubstance abuse, diverticulitis, B12 deficiency, neuropathy, chronic pain, depression, anxiety, WPW syndrome s/p ablation and HTN who presented from home to the ED via EMS on Friday afternoon ED after having 3 seizures at home, each lasing less than 1 minute. On EMS arrival, she was postictal. En route to the ED, she had another seizure, lasting for 2.5 minutes, for which she was given 1 mg IV Versed . She was stuporous on arrival to the ED, but maintaining her airway. She received Valium  10 mg x 2, and was loaded with 1000 mg of Keppra  in the ED. About 2 hours after arrival to the ED, she had a 5th seizure, manifesting with rightward gaze, clonus and apnea lasting for about 30 seconds. She was postictal afterwards, but became awake and oriented within about 1 hour post-seizure. Her last seizure was 3 months ago. Per her fianc, the patient has been compliant with her Keppra , but she missed her dose in the morning on Friday. UDS came back positive for cocaine and THC.  - Exam is nonfocal. She is poorly oriented to time. No clinical seizure activity noted.  - Labs: - Na and Mg normal. CA mildly decreased at 8.1.  - BUN normal. Initial Cr mildly  elevated, now normal.  - WBC 16.7 on presentation, down to 12.8 this morning.  - APAP and ASA levels negative.  - Glucose was 200 on presentation.  - UDS positive for cocaine, THC and fentanyl  - No Keppra  level drawn - Impression: Breakthrough seizures. Her missed Keppra  dose may have played a role, but cocaine use (+UDS) is felt to be the more likely etiology.   RECOMMENDATIONS  - Continue her home Keppra  at 1000 mg BID - Continue her home Neurontin  at 800 mg TID - EEG is unavailable at [facility] over the weekend. No strong indication for EEG as she has been seizure-free since yesterday evening -  Drug cessation counseling - Inpatient seizure precautions - Outpatient seizure precautions: Per [place]  DMV statutes, patients with seizures are not allowed to drive until  they have been seizure-free for six months. Use caution when using heavy equipment or power tools. Avoid working on ladders or at heights. Take showers instead of baths. Ensure the water temperature is not too high on the home water heater. Do not go swimming alone. When caring for infants or small children, sit down when holding, feeding, or changing them to minimize risk of injury to the child in the event you have a seizure. Also, Maintain good sleep hygiene. Avoid alcohol. - Neurohospitalist service will follow PRN. Please call if there are additional questions.   ______________________________________________________________________    Bonney SHARK, Tareka Jhaveri, MD [facility]     [1]  Allergies Allergen Reactions   Ciprofloxacin Rash   Morphine  Rash  [2]  Current Facility-Administered Medications:    acetaminophen  (TYLENOL ) tablet 650 mg, 650 mg, Oral, Q6H PRN, Niu, Xilin, MD, 650 mg at 07/22/24 0553   albuterol  (PROVENTIL ) (2.5 MG/3ML) 0.083% nebulizer solution 2.5 mg, 2.5 mg, Nebulization, Q4H PRN, Niu, Xilin, MD   amLODipine  (NORVASC ) tablet 5 mg, 5 mg, Oral, Daily, Niu, Xilin, MD   aspirin  EC  tablet 81 mg, 81 mg, Oral, Daily, Niu, Xilin, MD   cyanocobalamin  (VITAMIN B12) tablet 1,000 mcg, 1,000 mcg, Oral, Daily, Niu, Xilin, MD   cyclobenzaprine  (FLEXERIL ) tablet 10 mg, 10 mg, Oral, TID PRN, Niu, Xilin, MD   dextromethorphan-guaiFENesin  (MUCINEX  DM) 30-600 MG per 12 hr tablet 1 tablet, 1 tablet, Oral, BID PRN, Niu, Xilin, MD   DULoxetine  (CYMBALTA ) DR capsule 60 mg, 60 mg, Oral, BID, Niu, Xilin, MD   enoxaparin  (LOVENOX ) injection 40 mg, 40 mg, Subcutaneous, Q24H, Niu, Xilin, MD, 40 mg at 07/21/24 2040   famotidine  (PEPCID ) tablet 20 mg, 20 mg, Oral, Daily, Niu, Xilin, MD   gabapentin  (NEURONTIN ) capsule 800 mg, 800 mg, Oral, TID, Niu, Xilin, MD   hydrALAZINE  (APRESOLINE ) injection 5 mg, 5 mg, Intravenous, Q2H PRN, Niu, Xilin, MD   hydrOXYzine  (ATARAX ) tablet 25 mg, 25 mg, Oral, Daily, Niu, Xilin, MD   levETIRAcetam  (KEPPRA ) tablet 1,000 mg, 1,000 mg, Oral, BID, Niu, Xilin, MD, 1,000 mg at 07/22/24 9196   LORazepam  (ATIVAN ) injection 2 mg, 2 mg, Intravenous, Q2H PRN, Niu, Xilin, MD   nicotine  (NICODERM CQ  - dosed in mg/24 hours) patch 21 mg, 21 mg, Transdermal, Daily, Niu, Xilin, MD, 21 mg at 07/21/24 2042   ondansetron  (ZOFRAN ) injection 4 mg, 4 mg, Intravenous, Q8H PRN, Niu, Xilin, MD   oxyCODONE  (Oxy IR/ROXICODONE ) immediate release tablet 5 mg, 5 mg, Oral, Q6H PRN, Niu, Xilin, MD  Current Outpatient Medications:    acetaminophen  (TYLENOL ) 500 MG tablet, Take 500 mg by mouth every 6 (six) hours as needed., Disp: , Rfl:    albuterol  (VENTOLIN  HFA) 108 (90 Base) MCG/ACT inhaler, Inhale 1-2 puffs into the lungs every 4 (four) hours as needed., Disp: , Rfl:    amLODipine  (NORVASC ) 5 MG tablet, Take 5 mg by mouth daily., Disp: , Rfl:    cyclobenzaprine  (FLEXERIL ) 10 MG tablet, Take 1 tablet (10 mg total) by mouth 3 (three) times daily as needed for muscle spasms., Disp: 30 tablet, Rfl: 0   DULoxetine  HCl 40 MG CPEP, Take 60 mg by mouth 2 (two) times daily., Disp: , Rfl:    gabapentin   (NEURONTIN ) 800 MG tablet, Take 800 mg by mouth 3 (three) times daily., Disp: , Rfl:  hydrOXYzine  (ATARAX ) 25 MG tablet, Take 25 mg by mouth daily., Disp: , Rfl:    levETIRAcetam  (KEPPRA ) 1000 MG tablet, Take 1 tablet (1,000 mg total) by mouth 2 (two) times daily., Disp: 60 tablet, Rfl: 0   lidocaine  (LIDODERM ) 5 %, Place 1 patch onto the skin daily., Disp: , Rfl:    ondansetron  (ZOFRAN -ODT) 4 MG disintegrating tablet, Take 4 mg by mouth every 8 (eight) hours as needed for nausea or vomiting., Disp: , Rfl:    oxyCODONE  (OXY IR/ROXICODONE ) 5 MG immediate release tablet, Take 5 mg by mouth every 6 (six) hours as needed for breakthrough pain., Disp: , Rfl:    aluminum -magnesium  hydroxide-simethicone  (MAALOX) 200-200-20 MG/5ML SUSP, Take 30 mLs by mouth 4 (four) times daily -  before meals and at bedtime. (Patient not taking: Reported on 07/21/2024), Disp: 1680 mL, Rfl: 0   aspirin  EC 81 MG tablet, Take 81 mg by mouth daily., Disp: , Rfl:    clotrimazole -betamethasone  (LOTRISONE ) cream, Apply 1 Application topically daily. (Patient not taking: Reported on 07/21/2024), Disp: 45 g, Rfl: 2   cyanocobalamin  1000 MCG tablet, Take 1 tablet (1,000 mcg total) by mouth daily., Disp: 30 tablet, Rfl: 0   diclofenac Sodium (VOLTAREN) 1 % GEL, Apply 4 g topically 4 (four) times daily., Disp: , Rfl:    famotidine  (PEPCID ) 20 MG tablet, Take 1 tablet (20 mg total) by mouth daily., Disp: 30 tablet, Rfl: 0   feeding supplement (ENSURE ENLIVE / ENSURE PLUS) LIQD, Take 237 mLs by mouth 2 (two) times daily between meals., Disp: 14220 mL, Rfl: 0   HYDROcodone -acetaminophen  (NORCO/VICODIN) 5-325 MG tablet, Take 1 tablet by mouth every 4 (four) hours as needed. (Patient not taking: Reported on 07/21/2024), Disp: 10 tablet, Rfl: 0   ibuprofen  (ADVIL ) 800 MG tablet, Take 1 tablet (800 mg total) by mouth 3 (three) times daily. (Patient not taking: Reported on 07/21/2024), Disp: 60 tablet, Rfl: 1   NARCAN  4 MG/0.1ML LIQD nasal spray kit,  Place 1 spray into the nose once., Disp: , Rfl:    oxyCODONE -acetaminophen  (PERCOCET) 5-325 MG tablet, Take 1 tablet by mouth every 4 (four) hours as needed for severe pain (pain score 7-10). (Patient not taking: Reported on 07/21/2024), Disp: 30 tablet, Rfl: 0   polyethylene glycol (MIRALAX  / GLYCOLAX ) 17 g packet, Place 17 g into feeding tube daily as needed for moderate constipation. (Patient not taking: Reported on 07/21/2024), Disp: 30 each, Rfl: 0   potassium chloride  SA (KLOR-CON  M) 20 MEQ tablet, Take 1 tablet (20 mEq total) by mouth daily for 3 days., Disp: 3 tablet, Rfl: 0   tiotropium (SPIRIVA ) 18 MCG inhalation capsule, Place 18 mcg into inhaler and inhale daily. (Patient not taking: Reported on 07/21/2024), Disp: , Rfl:

## 2024-07-22 NOTE — Progress Notes (Signed)
" °  Progress Note   Patient: Alison Walker FMW:[id number] DOB: Dec 02, 1960 DOA: 07/21/2024     0 DOS: the patient was seen and examined on 07/22/2024   Brief hospital course: MEOSHIA BILLING is a 64 y.o. female with medical history significant of seizure, HTN, COPD, GERD, polysubstance abuse, vitamin B12 deficiency, neuropathy, diverticulitis, WPW (s/p of ablation), chronic pain, depression with anxiety, GERD, who presents with seizure  She was loaded with Keppra  IV, seen by neurology. Urine tox screen positive for cocaine, fentanyl , marijuana and benzodiazepine.   Principal Problem:   Seizure (HCC) Active Problems:   Essential hypertension   Hypokalemia   COPD (chronic obstructive pulmonary disease) (HCC)   Polysubstance abuse (HCC)   AKI (acute kidney injury)   Metabolic acidosis   Leukocytosis   Chronic pain syndrome   Tobacco use disorder   Nausea vomiting and diarrhea   Assessment and Plan: Seizure [facility]):  Discussed with the patient and fianc, patient was compliant with seizure medicine.  However, patient has positive cocaine, which may contribute to her seizure. Discussed with neurology, will monitor the patient overnight for additional seizure activity.  Will also obtain PT evaluation before discharge.   Essential hypertension Continue amlodipine .   Hypokalemia resolved. Acute kidney injury resolved. Metabolic acidosis resolved. Potassium normalized, renal function also normalized with fluids.   COPD (chronic obstructive pulmonary disease) (HCC): Stable No exacerbation.   Polysubstance abuse (HCC) Urine drug screen positive for cocaine, marijuana, benzodiazepine and fentanyl .  Strongly urged patient to quit cocaine to avoid triggering seizure.  Also advised to quit other drugs.  Leukocytosis:  Improved, no evidence of infection.   Chronic pain syndrome -Continue home oxycodone  - As needed Tylenol    Tobacco use disorder -Continue routine patch   Nausea vomiting  and diarrhea No additional diarrhea since admission.  Patient tolerating diet.      Subjective:  Patient does not have additional seizure.  No longer has any nausea vomiting.  Tolerating diet.  Physical Exam: Vitals:   07/22/24 1200 07/22/24 1230 07/22/24 1300 07/22/24 1330  BP: 137/62 121/72 104/73 111/68  Pulse: 85 79 87 86  Resp: 18 19 17 17   Temp:      TempSrc:      SpO2: 100% 99% 100% 100%  Weight:      Height:       General exam: Appears calm and comfortable  Respiratory system: Clear to auscultation. Respiratory effort normal. Cardiovascular system: S1 & S2 heard, RRR. No JVD, murmurs, rubs, gallops or clicks. No pedal edema. Gastrointestinal system: Abdomen is nondistended, soft and nontender. No organomegaly or masses felt. Normal bowel sounds heard. Central nervous system: Alert and oriented. No focal neurological deficits. Extremities: Symmetric 5 x 5 power. Skin: No rashes, lesions or ulcers Psychiatry: Judgement and insight appear normal. Mood & affect appropriate.    Data Reviewed:  Reviewed CT scan results, lab results.  Family Communication: Fianc updated at bedside.  Disposition: Status is: Observation      Time spent: 35 minutes  Author: Murvin Mana, MD 07/22/2024 2:57 PM  For on call review [email or web address].    "

## 2024-07-22 NOTE — Hospital Course (Signed)
 Alison Walker is a 65 y.o. female with medical history significant of seizure, HTN, COPD, GERD, polysubstance abuse, vitamin B12 deficiency, neuropathy, diverticulitis, WPW (s/p of ablation), chronic pain, depression with anxiety, GERD, who presents with seizure  She was loaded with Keppra  IV, seen by neurology. Urine tox screen positive for cocaine, fentanyl , marijuana and benzodiazepine. Per neurology, patient be continued on home dose Keppra .  Cessation of cocaine strongly urged. Patient was In the hospital for another day, she no longer has altered mental status.  At this point, she is medically stable for discharge.

## 2024-07-23 DIAGNOSIS — N179 Acute kidney failure, unspecified: Secondary | ICD-10-CM | POA: Diagnosis not present

## 2024-07-23 DIAGNOSIS — R569 Unspecified convulsions: Secondary | ICD-10-CM | POA: Diagnosis not present

## 2024-07-23 DIAGNOSIS — E876 Hypokalemia: Secondary | ICD-10-CM | POA: Diagnosis not present

## 2024-07-23 MED ORDER — INFLUENZA VAC SPLIT HIGH-DOSE 0.5 ML IM SUSY
0.5000 mL | PREFILLED_SYRINGE | INTRAMUSCULAR | Status: AC
  Administered 2024-07-23: 0.5 mL via INTRAMUSCULAR
  Filled 2024-07-23: qty 0.5

## 2024-07-23 MED ORDER — PNEUMOCOCCAL 20-VAL CONJ VACC 0.5 ML IM SUSY
0.5000 mL | PREFILLED_SYRINGE | INTRAMUSCULAR | Status: DC
  Filled 2024-07-23: qty 0.5

## 2024-07-23 MED ORDER — PNEUMOCOCCAL 20-VAL CONJ VACC 0.5 ML IM SUSY
0.5000 mL | PREFILLED_SYRINGE | INTRAMUSCULAR | Status: AC
  Administered 2024-07-23: 0.5 mL via INTRAMUSCULAR

## 2024-07-23 NOTE — Discharge Summary (Signed)
 " Physician Discharge Summary   Patient: Alison Walker MRN: [id number] DOB: 02-10-1961  Admit date:     07/21/2024  Discharge date: 07/23/2024  Discharge Physician: Murvin Mana   PCP: Center, [facility]   Recommendations at discharge:   Follow-up with PCP in 1 week. Follow-up with neurology in 1 month.  Discharge Diagnoses: Principal Problem:   Seizure [facility]) Active Problems:   Essential hypertension   Hypokalemia   COPD (chronic obstructive pulmonary disease) (HCC)   Polysubstance abuse (HCC)   AKI (acute kidney injury)   Metabolic acidosis   Leukocytosis   Chronic pain syndrome   Tobacco use disorder   Nausea vomiting and diarrhea  Resolved Problems:   * No resolved hospital problems. *  Hospital Course: Alison Walker is a 64 y.o. female with medical history significant of seizure, HTN, COPD, GERD, polysubstance abuse, vitamin B12 deficiency, neuropathy, diverticulitis, WPW (s/p of ablation), chronic pain, depression with anxiety, GERD, who presents with seizure  She was loaded with Keppra  IV, seen by neurology. Urine tox screen positive for cocaine, fentanyl , marijuana and benzodiazepine. Per neurology, patient be continued on home dose Keppra .  Cessation of cocaine strongly urged. Patient was In the hospital for another day, she no longer has altered mental status.  At this point, she is medically stable for discharge.  Assessment and Plan: Seizure [facility]):  Discussed with the patient and fianc, patient was compliant with seizure medicine.  However, patient has positive cocaine, which  contributed to her seizure. Discussed with neurology, no need for increased dose of seizure medicine.  Patient is strongly urged to quit cocaine. Patient was seen by PT, recommend outpatient PT, will ask TOC to set up.   Essential hypertension Continue amlodipine .   Hypokalemia resolved. Acute kidney injury resolved. Metabolic acidosis resolved. Potassium normalized, renal  function also normalized with fluids.   COPD (chronic obstructive pulmonary disease) (HCC): Stable No exacerbation.   Polysubstance abuse (HCC) Urine drug screen positive for cocaine, marijuana, benzodiazepine and fentanyl .  Strongly urged patient to quit cocaine to avoid triggering seizure.  Also advised to quit other drugs.   Leukocytosis:  Improved, no evidence of infection.   Chronic pain syndrome -Continue home oxycodone     Tobacco use disorder Advised to quit.   Nausea vomiting and diarrhea No additional diarrhea since admission.  Patient tolerating diet.       Consultants: Neurology Procedures performed: None  Disposition: Home Diet recommendation:  Discharge Diet Orders (From admission, onward)     Start     Ordered   07/23/24 0000  Diet - low sodium heart healthy        07/23/24 1202           Cardiac diet DISCHARGE MEDICATION: Allergies as of 07/23/2024       Reactions   Ciprofloxacin Rash   Morphine  Rash        Medication List     STOP taking these medications    aluminum -magnesium  hydroxide-simethicone  200-200-20 MG/5ML Susp Commonly known as: MAALOX   clotrimazole -betamethasone  cream Commonly known as: LOTRISONE    HYDROcodone -acetaminophen  5-325 MG tablet Commonly known as: NORCO/VICODIN   ibuprofen  800 MG tablet Commonly known as: ADVIL    oxyCODONE -acetaminophen  5-325 MG tablet Commonly known as: Percocet   polyethylene glycol 17 g packet Commonly known as: MIRALAX  / GLYCOLAX    tiotropium 18 MCG inhalation capsule Commonly known as: SPIRIVA        TAKE these medications    acetaminophen  500 MG tablet Commonly known as:  TYLENOL  Take 500 mg by mouth every 6 (six) hours as needed.   albuterol  108 (90 Base) MCG/ACT inhaler Commonly known as: VENTOLIN  HFA Inhale 1-2 puffs into the lungs every 4 (four) hours as needed.   amLODipine  5 MG tablet Commonly known as: NORVASC  Take 5 mg by mouth daily.   aspirin  EC 81 MG  tablet Take 81 mg by mouth daily.   cyanocobalamin  1000 MCG tablet Take 1 tablet (1,000 mcg total) by mouth daily.   cyclobenzaprine  10 MG tablet Commonly known as: FLEXERIL  Take 1 tablet (10 mg total) by mouth 3 (three) times daily as needed for muscle spasms.   diclofenac Sodium 1 % Gel Commonly known as: VOLTAREN Apply 4 g topically 4 (four) times daily.   DULoxetine  HCl 40 MG Cpep Take 60 mg by mouth 2 (two) times daily.   famotidine  20 MG tablet Commonly known as: PEPCID  Take 1 tablet (20 mg total) by mouth daily.   feeding supplement Liqd Take 237 mLs by mouth 2 (two) times daily between meals.   gabapentin  800 MG tablet Commonly known as: NEURONTIN  Take 800 mg by mouth 3 (three) times daily.   hydrOXYzine  25 MG tablet Commonly known as: ATARAX  Take 25 mg by mouth daily.   levETIRAcetam  1000 MG tablet Commonly known as: KEPPRA  Take 1 tablet (1,000 mg total) by mouth 2 (two) times daily.   lidocaine  5 % Commonly known as: LIDODERM  Place 1 patch onto the skin daily.   Narcan  4 MG/0.1ML Liqd nasal spray kit Generic drug: naloxone  Place 1 spray into the nose once.   ondansetron  4 MG disintegrating tablet Commonly known as: ZOFRAN -ODT Take 4 mg by mouth every 8 (eight) hours as needed for nausea or vomiting.   oxyCODONE  5 MG immediate release tablet Commonly known as: Oxy IR/ROXICODONE  Take 5 mg by mouth every 6 (six) hours as needed for breakthrough pain.   potassium chloride  SA 20 MEQ tablet Commonly known as: KLOR-CON  M Take 1 tablet (20 mEq total) by mouth daily for 3 days.        Follow-up Information     [facility], [facility] Follow up in 1 week(s).   Specialty: General Practice Contact information: [address] [place] [address] [phone]         [facility] Follow up in 1 month(s).   Contact information: [address] [place]   [address] [phone]                Discharge Exam: Alison Walker   07/21/24 1651 07/22/24 2159  Weight: 62 kg 62.1 kg   General exam: Appears calm and comfortable  Respiratory system: Clear to auscultation. Respiratory effort normal. Cardiovascular system: S1 & S2 heard, RRR. No JVD, murmurs, rubs, gallops or clicks. No pedal edema. Gastrointestinal system: Abdomen is nondistended, soft and nontender. No organomegaly or masses felt. Normal bowel sounds heard. Central nervous system: Alert and oriented. No focal neurological deficits. Extremities: Symmetric 5 x 5 power. Skin: No rashes, lesions or ulcers Psychiatry: Judgement and insight appear normal. Mood & affect appropriate.    Condition at discharge: good  The results of significant diagnostics from this hospitalization (including imaging, microbiology, ancillary and laboratory) are listed below for reference.   Imaging Studies: CT ABDOMEN PELVIS W CONTRAST Result Date: 07/03/2024 CLINICAL DATA:  Abdominal pain since yesterday. History of diverticulitis and colostomy. EXAM: CT ABDOMEN AND PELVIS WITH CONTRAST TECHNIQUE: Multidetector CT imaging of the abdomen and pelvis was performed  using the standard protocol following bolus administration of intravenous contrast. RADIATION DOSE REDUCTION: This exam was performed according to the departmental dose-optimization program which includes automated exposure control, adjustment of the mA and/or kV according to patient size and/or use of iterative reconstruction technique. CONTRAST:  OMNIPAQUE  IOHEXOL  300 MG/ML  SOLN COMPARISON:  04/14/2023 FINDINGS: Lower chest: Patchy ground-glass opacity in the lung bases, right greater than left is likely atelectatic although infectious/inflammatory etiology cannot be excluded. Hepatobiliary: No suspicious focal abnormality within the liver parenchyma. Similar mild intrahepatic biliary duct dilatation. Gallbladder surgically absent. Stable appearance of the common bile duct  dilatation up to 9 mm diameter in the head of the pancreas. Pancreas: No focal mass lesion. No dilatation of the main duct. No intraparenchymal cyst. No peripancreatic edema. Spleen: No splenomegaly. No suspicious focal mass lesion. Adrenals/Urinary Tract: No adrenal nodule or mass. Cortical scarring noted in both kidneys. No evidence for hydroureter. The urinary bladder appears normal for the degree of distention. Stomach/Bowel: Stomach is unremarkable. No gastric wall thickening. No evidence of outlet obstruction. Duodenum is normally positioned as is the ligament of Treitz. No small bowel wall thickening. No small bowel dilatation. The terminal ileum is normal. The appendix is normal. Large stool volume noted from the cecal tip to the rectum. Left colonic anastomosis evident. Vascular/Lymphatic: There is moderate atherosclerotic calcification of the abdominal aorta without aneurysm. There is no gastrohepatic or hepatoduodenal ligament lymphadenopathy. No retroperitoneal or mesenteric lymphadenopathy. No pelvic sidewall lymphadenopathy. Reproductive: Hysterectomy.  There is no adnexal mass. Other: No intraperitoneal free fluid. Musculoskeletal: No worrisome lytic or sclerotic osseous abnormality. L2 compression deformity is similar to prior. Compression deformity at T12 is new in the interval but age indeterminate. Advanced degenerative changes noted L4-5. IMPRESSION: 1. No acute findings in the abdomen or pelvis. 2. Large stool volume from the cecal tip to the rectum. Imaging features could be compatible with clinical constipation. 3. Patchy ground-glass opacity in the lung bases, right greater than left is likely atelectatic although infectious/inflammatory etiology cannot be excluded. 4. Compression deformity at T12 is new in the interval but age indeterminate. 5.  Aortic Atherosclerosis (ICD10-I70.0). Electronically Signed   By: Camellia Candle M.D.   On: 07/03/2024 07:41    Microbiology: Results for orders  placed or performed during the hospital encounter of 07/21/24  Resp panel by RT-PCR (RSV, Flu A&B, Covid) Anterior Nasal Swab     Status: None   Collection Time: 07/21/24  5:08 PM   Specimen: Anterior Nasal Swab  Result Value Ref Range Status   SARS Coronavirus 2 by RT PCR NEGATIVE NEGATIVE Final    Comment: (NOTE) SARS-CoV-2 target nucleic acids are NOT DETECTED.  The SARS-CoV-2 RNA is generally detectable in upper respiratory specimens during the acute phase of infection. The lowest concentration of SARS-CoV-2 viral copies this assay can detect is 138 copies/mL. A negative result does not preclude SARS-Cov-2 infection and should not be used as the sole basis for treatment or other patient management decisions. A negative result may occur with  improper specimen collection/handling, submission of specimen other than nasopharyngeal swab, presence of viral mutation(s) within the areas targeted by this assay, and inadequate number of viral copies(<138 copies/mL). A negative result must be combined with clinical observations, patient history, and epidemiological information. The expected result is Negative.  Fact Sheet for Patients:  bloggercourse.com  Fact Sheet for Healthcare Providers:  seriousbroker.it  This test is no t yet approved or cleared by the [place]  FDA and  has been authorized for detection and/or diagnosis of SARS-CoV-2 by FDA under an Emergency Use Authorization (EUA). This EUA will remain  in effect (meaning this test can be used) for the duration of the COVID-19 declaration under Section 564(b)(1) of the Act, 21 U.S.C.section 360bbb-3(b)(1), unless the authorization is terminated  or revoked sooner.       Influenza A by PCR NEGATIVE NEGATIVE Final   Influenza B by PCR NEGATIVE NEGATIVE Final    Comment: (NOTE) The Xpert Xpress SARS-CoV-2/FLU/RSV plus assay is intended as an aid in the diagnosis of  influenza from Nasopharyngeal swab specimens and should not be used as a sole basis for treatment. Nasal washings and aspirates are unacceptable for Xpert Xpress SARS-CoV-2/FLU/RSV testing.  Fact Sheet for Patients: bloggercourse.com  Fact Sheet for Healthcare Providers: seriousbroker.it  This test is not yet approved or cleared by the [place]  FDA and has been authorized for detection and/or diagnosis of SARS-CoV-2 by FDA under an Emergency Use Authorization (EUA). This EUA will remain in effect (meaning this test can be used) for the duration of the COVID-19 declaration under Section 564(b)(1) of the Act, 21 U.S.C. section 360bbb-3(b)(1), unless the authorization is terminated or revoked.     Resp Syncytial Virus by PCR NEGATIVE NEGATIVE Final    Comment: (NOTE) Fact Sheet for Patients: bloggercourse.com  Fact Sheet for Healthcare Providers: seriousbroker.it  This test is not yet approved or cleared by the [place]  FDA and has been authorized for detection and/or diagnosis of SARS-CoV-2 by FDA under an Emergency Use Authorization (EUA). This EUA will remain in effect (meaning this test can be used) for the duration of the COVID-19 declaration under Section 564(b)(1) of the Act, 21 U.S.C. section 360bbb-3(b)(1), unless the authorization is terminated or revoked.  Performed at [facility], [address] [place], [place] [address]   C Difficile Quick Screen w PCR reflex     Status: None   Collection Time: 07/22/24  5:43 AM   Specimen: In/Out Cath Urine; Stool  Result Value Ref Range Status   C Diff antigen NEGATIVE NEGATIVE Final   C Diff toxin NEGATIVE NEGATIVE Final   C Diff interpretation No C. difficile detected.  Final    Comment: Performed at [facility], [address] [place], [place] [address]  Gastrointestinal Panel by PCR , Stool      Status: None   Collection Time: 07/22/24  5:43 AM   Specimen: In/Out Cath Urine; Stool  Result Value Ref Range Status   Campylobacter species NOT DETECTED NOT DETECTED Final   Plesimonas shigelloides NOT DETECTED NOT DETECTED Final   Salmonella species NOT DETECTED NOT DETECTED Final   Yersinia enterocolitica NOT DETECTED NOT DETECTED Final   Vibrio species NOT DETECTED NOT DETECTED Final   Vibrio cholerae NOT DETECTED NOT DETECTED Final   Enteroaggregative E coli (EAEC) NOT DETECTED NOT DETECTED Final   Enteropathogenic E coli (EPEC) NOT DETECTED NOT DETECTED Final   Enterotoxigenic E coli (ETEC) NOT DETECTED NOT DETECTED Final   Shiga like toxin producing E coli (STEC) NOT DETECTED NOT DETECTED Final   Shigella/Enteroinvasive E coli (EIEC) NOT DETECTED NOT DETECTED Final   Cryptosporidium NOT DETECTED NOT DETECTED Final   Cyclospora cayetanensis NOT DETECTED NOT DETECTED Final   Entamoeba histolytica NOT DETECTED NOT DETECTED Final   Giardia lamblia NOT DETECTED NOT DETECTED Final   Adenovirus F40/41 NOT DETECTED NOT DETECTED Final   Astrovirus NOT DETECTED NOT DETECTED Final   Norovirus GI/GII NOT DETECTED  NOT DETECTED Final   Rotavirus A NOT DETECTED NOT DETECTED Final   Sapovirus (I, II, IV, and V) NOT DETECTED NOT DETECTED Final    Comment: Performed at [facility], [address] [place], [place] [address]    Labs: CBC: Recent Labs  Lab 07/21/24 1708 07/22/24 0500  WBC 16.7* 12.8*  NEUTROABS 14.7*  --   HGB 13.7 11.9*  HCT 42.2 35.4*  MCV 93.0 91.0  PLT 504* 364   Basic Metabolic Panel: Recent Labs  Lab 07/21/24 1708 07/22/24 0500  NA 138 141  K 2.9* 3.8  CL 100 109  CO2 15* 22  GLUCOSE 200* 106*  BUN 20 13  CREATININE 1.10* 0.75  CALCIUM 9.5 8.1*  MG  --  1.9  PHOS  --  2.5   Liver Function Tests: Recent Labs  Lab 07/21/24 1708  AST 30  ALT 17  ALKPHOS 98  BILITOT 0.3  PROT 7.3  ALBUMIN 4.2   CBG: No results for input(s):  GLUCAP in the last 168 hours.  Discharge time spent: 35 minutes.  Signed: Murvin Mana, MD [facility] 07/23/2024 "

## 2024-07-23 NOTE — Discharge Instructions (Signed)
 An outpatient referral was made to the [facility]. You will receive a call from the scheduler to schedule an appointment. If you do not hear from them please call [phone].

## 2024-07-23 NOTE — Plan of Care (Signed)

## 2024-07-23 NOTE — Progress Notes (Signed)
" ° ° ° °  [facility] REFERRAL        Occupational Therapy * Physical Therapy * Speech Therapy                           DATE: 07/23/2024  PATIENT NAME: Alison Walker   PATIENT MRN: [id number]       DIAGNOSIS/DIAGNOSIS CODE   DATE OF DISCHARGE: 07/23/2024       PRIMARY CARE PHYSICIAN:   [facility]   PCP PHONE/FAX: [phone]     Dear Provider (Name: [facility] outpatient __  Fax: [phone]   I certify that I have examined this patient and that occupational/physical/speech therapy is necessary on an outpatient basis.    The patient has expressed interest in completing their recommended course of therapy at your  location.  Once a formal order from the patient's primary care physician has been obtained, please  contact him/her to schedule an appointment for evaluation at your earliest convenience.   [ X]  Physical Therapy Evaluate and Treat  [  ]  Occupational Therapy Evaluate and Treat  [  ]  Speech Therapy Evaluate and Treat         The patient's primary care physician (listed above) must furnish and be responsible for a formal order such that the recommended services may be furnished while under the primary physician's care, and that the plan of care will be established and reviewed every 30 days (or more often if condition necessitates).   "

## 2024-07-23 NOTE — TOC Transition Note (Signed)
 Transition of Care [facility]) - Discharge Note   Patient Details  Name: MCKENSEY BERGHUIS MRN: [id number] Date of Birth: April 01, 1961  [facility] CM/SW Contact:  Darin Arndt L Wende Longstreth, LCSW Phone Number: 07/23/2024, 1:14 PM   Clinical Narrative:     Discharge summary is in. Patient choice offered to patient for outpatient rehabilitation services. She wanted referral to be made to [facility]. She reported having transportation for discharge and to get to medical appointments. Information added to the AVS.   TOC signing off.         Patient Goals and CMS Choice            Discharge Placement                       Discharge Plan and Services Additional resources added to the After Visit Summary for                                       Social Drivers of Health (SDOH) Interventions SDOH Screenings   Food Insecurity: No Food Insecurity (07/22/2024)  Housing: Low Risk (07/22/2024)  Transportation Needs: No Transportation Needs (07/22/2024)  Utilities: Not At Risk (07/22/2024)  Financial Resource Strain: Low Risk (06/04/2023)   Received from [facility]  Social Connections: Moderately Isolated (07/22/2024)  Tobacco Use: High Risk (07/21/2024)     Readmission Risk Interventions     No data to display

## 2024-07-23 NOTE — Evaluation (Signed)
 Physical Therapy Evaluation Patient Details Name: Alison Walker MRN: [id number] DOB: [birth year] Today's Date: 07/23/2024  History of Present Illness  Alison Walker is a 64 y.o. female with medical history significant of seizure, HTN, COPD, GERD, polysubstance abuse, vitamin B12 deficiency, neuropathy, diverticulitis, WPW (s/p of ablation), chronic pain, depression with anxiety, GERD, who presents with seizure  Clinical Impression  Patient noted to be in supine position at PT arrival in room, for an initial PT evaluation due to a decline in functional status, with baseline mobility reported as independent, and currently requiring supervision/modI for hallway ambulation of 75' feet with no AD. The patient is A&O x 4, presenting with good willingness to work with PT. The patient resides in a apartment and lives with boyfriend with family/friend support. There are 6 STE inside the residence. Gait was assessed with noAD. Gait mechanic observations noted mildly steady with reports of mild weakness and dizziness. The overall clinical impression is that the patient presents with mild mobility limitations. Pt near baseline level of function. Recommended skilled PT will address safety, mobility, and discharge planning.        If plan is discharge home, recommend the following: A little help with walking and/or transfers;Help with stairs or ramp for entrance;Assist for transportation   Can travel by private vehicle        Equipment Recommendations None recommended by PT  Recommendations for Other Services       Functional Status Assessment Patient has had a recent decline in their functional status and demonstrates the ability to make significant improvements in function in a reasonable and predictable amount of time.     Precautions / Restrictions Restrictions Weight Bearing Restrictions Per Provider Order: No      Mobility  Bed Mobility Overal bed mobility: Modified Independent                   Transfers Overall transfer level: Modified independent                      Ambulation/Gait Ambulation/Gait assistance: Supervision Gait Distance (Feet): 75 Feet Assistive device: None Gait Pattern/deviations: Step-through pattern Gait velocity: decreased     General Gait Details: mildl unsteady; pt states that she feels somewhat weak but almost at her normal  Stairs            Wheelchair Mobility     Tilt Bed    Modified Rankin (Stroke Patients Only)       Balance Overall balance assessment: Modified Independent                                           Pertinent Vitals/Pain Pain Assessment Pain Assessment: No/denies pain    Home Living Family/patient expects to be discharged to:: Private residence Living Arrangements: Spouse/significant other Available Help at Discharge: Friend(s) Type of Home: Apartment Home Access: Stairs to enter Entrance Stairs-Rails: Left Entrance Stairs-Number of Steps: 6   Home Layout: One level Home Equipment: None      Prior Function Prior Level of Function : Independent/Modified Independent                     Extremity/Trunk Assessment   Upper Extremity Assessment Upper Extremity Assessment: Overall WFL for tasks assessed    Lower Extremity Assessment Lower Extremity Assessment: Generalized weakness;Overall [facility] for tasks assessed  Cervical / Trunk Assessment Cervical / Trunk Assessment: Kyphotic  Communication   Communication Communication: No apparent difficulties    Cognition Arousal: Alert Behavior During Therapy: WFL for tasks assessed/performed   PT - Cognitive impairments: No apparent impairments                         Following commands: Intact       Cueing Cueing Techniques: Verbal cues     General Comments      Exercises     Assessment/Plan    PT Assessment Patient needs continued PT services  PT Problem List Decreased  strength;Decreased activity tolerance;Decreased balance;Decreased mobility       PT Treatment Interventions DME instruction;Gait training;Stair training;Functional mobility training;Therapeutic activities;Therapeutic exercise;Balance training;Patient/family education;Neuromuscular re-education    PT Goals (Current goals can be found in the Care Plan section)  Acute Rehab PT Goals Patient Stated Goal: Pt wants to return home PT Goal Formulation: With patient Time For Goal Achievement: 08/06/24 Potential to Achieve Goals: Good    Frequency Min 1X/week     Co-evaluation               AM-PAC PT 6 Clicks Mobility  Outcome Measure Help needed turning from your back to your side while in a flat bed without using bedrails?: None Help needed moving from lying on your back to sitting on the side of a flat bed without using bedrails?: None Help needed moving to and from a bed to a chair (including a wheelchair)?: None Help needed standing up from a chair using your arms (e.g., wheelchair or bedside chair)?: None Help needed to walk in hospital room?: None Help needed climbing 3-5 steps with a railing? : None 6 Click Score: 24    End of Session   Activity Tolerance: Patient tolerated treatment well Patient left: in bed;with call bell/phone within reach;with bed alarm set Nurse Communication: Mobility status PT Visit Diagnosis: Other abnormalities of gait and mobility (R26.89);Difficulty in walking, not elsewhere classified (R26.2)    Time: 8964-8952 PT Time Calculation (min) (ACUTE ONLY): 12 min   Charges:   PT Evaluation $PT Eval Low Complexity: 1 Low   PT General Charges $$ ACUTE PT VISIT: 1 Visit         Sherlean Lesches DPT, PT    Antonios Ostrow A Maebry Obrien 07/23/2024, 11:52 AM

## 2024-07-24 LAB — LEVETIRACETAM LEVEL
